# Patient Record
Sex: Female | Born: 1935 | Race: White | Hispanic: No | Marital: Single | Smoking: Former smoker
Health system: Southern US, Community
[De-identification: ages and names within clinical notes are randomized; demographics above are authoritative.]

## PROBLEM LIST (undated history)

## (undated) DIAGNOSIS — D649 Anemia, unspecified: Secondary | ICD-10-CM

## (undated) DIAGNOSIS — M6281 Muscle weakness (generalized): Secondary | ICD-10-CM

## (undated) DIAGNOSIS — N289 Disorder of kidney and ureter, unspecified: Secondary | ICD-10-CM

## (undated) DIAGNOSIS — H269 Unspecified cataract: Secondary | ICD-10-CM

## (undated) DIAGNOSIS — T7840XA Allergy, unspecified, initial encounter: Secondary | ICD-10-CM

## (undated) DIAGNOSIS — R1310 Dysphagia, unspecified: Secondary | ICD-10-CM

## (undated) DIAGNOSIS — I10 Essential (primary) hypertension: Secondary | ICD-10-CM

## (undated) DIAGNOSIS — E119 Type 2 diabetes mellitus without complications: Secondary | ICD-10-CM

## (undated) DIAGNOSIS — E079 Disorder of thyroid, unspecified: Secondary | ICD-10-CM

## (undated) DIAGNOSIS — R296 Repeated falls: Secondary | ICD-10-CM

## (undated) DIAGNOSIS — E785 Hyperlipidemia, unspecified: Secondary | ICD-10-CM

## (undated) DIAGNOSIS — I5042 Chronic combined systolic (congestive) and diastolic (congestive) heart failure: Secondary | ICD-10-CM

## (undated) HISTORY — PX: ABDOMINAL HYSTERECTOMY: SHX81

## (undated) HISTORY — PX: OOPHORECTOMY: SHX86

## (undated) HISTORY — PX: EYE SURGERY: SHX253

## (undated) HISTORY — DX: Essential (primary) hypertension: I10

## (undated) HISTORY — DX: Allergy, unspecified, initial encounter: T78.40XA

## (undated) HISTORY — DX: Unspecified cataract: H26.9

## (undated) HISTORY — DX: Hyperlipidemia, unspecified: E78.5

## (undated) HISTORY — DX: Type 2 diabetes mellitus without complications: E11.9

## (undated) HISTORY — DX: Disorder of thyroid, unspecified: E07.9

---

## 1998-03-18 ENCOUNTER — Other Ambulatory Visit: Admission: RE | Admit: 1998-03-18 | Discharge: 1998-03-18 | Payer: Self-pay | Admitting: *Deleted

## 1999-05-15 ENCOUNTER — Other Ambulatory Visit: Admission: RE | Admit: 1999-05-15 | Discharge: 1999-05-15 | Payer: Self-pay | Admitting: *Deleted

## 2000-05-07 ENCOUNTER — Other Ambulatory Visit: Admission: RE | Admit: 2000-05-07 | Discharge: 2000-05-07 | Payer: Self-pay | Admitting: Internal Medicine

## 2001-08-04 ENCOUNTER — Ambulatory Visit (HOSPITAL_COMMUNITY): Admission: RE | Admit: 2001-08-04 | Discharge: 2001-08-04 | Payer: Self-pay | Admitting: *Deleted

## 2001-08-04 ENCOUNTER — Encounter (INDEPENDENT_AMBULATORY_CARE_PROVIDER_SITE_OTHER): Payer: Self-pay

## 2002-03-05 ENCOUNTER — Emergency Department (HOSPITAL_COMMUNITY): Admission: EM | Admit: 2002-03-05 | Discharge: 2002-03-05 | Payer: Self-pay | Admitting: Emergency Medicine

## 2006-10-29 LAB — HM DEXA SCAN

## 2008-02-10 LAB — HM PAP SMEAR: HM Pap smear: NEGATIVE

## 2008-04-27 ENCOUNTER — Ambulatory Visit (HOSPITAL_COMMUNITY): Admission: RE | Admit: 2008-04-27 | Discharge: 2008-04-27 | Payer: Self-pay | Admitting: *Deleted

## 2008-04-27 ENCOUNTER — Encounter (INDEPENDENT_AMBULATORY_CARE_PROVIDER_SITE_OTHER): Payer: Self-pay | Admitting: *Deleted

## 2008-04-27 LAB — HM COLONOSCOPY

## 2009-03-04 LAB — HM MAMMOGRAPHY: HM Mammogram: NEGATIVE

## 2010-05-06 ENCOUNTER — Encounter: Payer: Self-pay | Admitting: *Deleted

## 2010-05-06 DIAGNOSIS — I110 Hypertensive heart disease with heart failure: Secondary | ICD-10-CM | POA: Insufficient documentation

## 2010-05-06 DIAGNOSIS — E039 Hypothyroidism, unspecified: Secondary | ICD-10-CM | POA: Insufficient documentation

## 2010-05-06 DIAGNOSIS — I10 Essential (primary) hypertension: Secondary | ICD-10-CM

## 2010-05-06 DIAGNOSIS — E785 Hyperlipidemia, unspecified: Secondary | ICD-10-CM | POA: Insufficient documentation

## 2010-06-06 ENCOUNTER — Encounter: Payer: Self-pay | Admitting: Nurse Practitioner

## 2010-06-06 DIAGNOSIS — E8881 Metabolic syndrome: Secondary | ICD-10-CM

## 2010-06-20 NOTE — Op Note (Signed)
NAME:  Michele Walters, ADDAIR NO.:  [id number]   MEDICAL RECORD NO.:  [id number]          PATIENT TYPE:  AMB   LOCATION:  ENDO                         FACILITY:  [facility]   PHYSICIAN:  Georgiana Spinner, M.D.    DATE OF BIRTH:  1935/10/21   DATE OF PROCEDURE:  DATE OF DISCHARGE:                               OPERATIVE REPORT   PROCEDURES:  Colonoscopy with biopsy and polypectomy.   INDICATIONS:  Colon polyps.   ANESTHESIA:  Fentanyl 100 mcg, Versed 8 mg.   DESCRIPTION OF PROCEDURE:  With the patient mildly sedated in the left  lateral decubitus position, the Pentax videoscopic pediatric colonoscope  was inserted in the rectum and passed under direct vision with pressure  applied to reach the cecum, identified by the ileocecal valve and  appendiceal orifice, both of which were photographed.  From this point  the colonoscope was slowly withdrawn, taking circumferential views of  the colonic mucosa, stopping to suction liquid brownish material.  The  patient only took three-quarters of the prep.  We then stopped just  distal to the splenic flexure, at which point a polyp was seen,  photographed, and removed using snare cautery technique at setting of  20/150 blended current.  Tissue was retrieved by suctioning it through  the endoscope into a tissue trap.  We then stopped next in the  descending colon, where a second smaller polyp was seen.  It too was  photographed and it too was removed but we used hot biopsy forceps  technique with the same setting.  We then withdrew all the way to the  rectum, which appeared normal on direct and showed hemorrhoids on  retroflexed view.  The endoscope was straightened and withdrawn.  The  patient's vital signs, pulse oximeter remained stable.  The patient  tolerated the procedure well without apparent complication.   FINDINGS:  Diverticulosis of the sigmoid colon, moderate.  Internal  hemorrhoids, moderate in size.  A small polyp of the  descending colon  and a slightly larger polyp of the splenic flexure area removed.   PLAN:  Await biopsy report.  The patient will call me for results of the  biopsies and follow up with me as needed as an outpatient.           ______________________________  Georgiana Spinner, M.D.     GMO/MEDQ  D:  04/27/2008  T:  04/27/2008  Job:  [id number]   cc:   Ernestina Penna, M.D.  Fax: [phone]

## 2010-06-23 NOTE — Procedures (Signed)
[facility]  Patient:    [name], [name] Visit Number: [id number] MRN: [id number]          Service Type: END Location: ENDO Attending Physician:  Sabino Gasser Dictated by:   Sabino Gasser, M.D. Proc. Date: 08/04/01 Admit Date:  08/04/2001 Discharge Date: 08/04/2001                             Procedure Report  PROCEDURE:  Colonoscopy.  INDICATION FOR PROCEDURE:  Colon polyps.  ANESTHESIA:  Demerol 80, Versed 8 mg.  DESCRIPTION OF PROCEDURE:  With the patient mildly sedated in the left lateral decubitus position, the Olympus videoscopic colonoscope was inserted in the rectum and passed under direct vision to the cecum identified by the ileocecal valve and appendiceal orifice, the latter of which was photographed. From this point, the colonoscope was slowly withdrawn taking circumferential views of the entire colonic mucosa, stopping at approximately 60 cm from the anal verge at which point a polyp was seen, photographed and removed using hot biopsy forceps technique on a setting of 20/20 blended current. The tissue was retrieved, the endoscope was withdrawn all the way to the rectum which appeared normal on direct and showed hemorrhoids on retroflexed view.  The endoscope was straightened and withdrawn. The patients vital signs and pulse oximeter remained stable. The patient tolerated the procedure well without apparent complications.  FINDINGS:  Internal hemorrhoids, polyp at 60 cm, await biopsy report. The patient will call me for results and followup with me as an outpatient. Dictated by:   Sabino Gasser, M.D. Attending Physician:  Sabino Gasser DD:  08/04/01 TD:  08/06/01 Job: [id number]

## 2012-04-22 ENCOUNTER — Telehealth: Payer: Self-pay | Admitting: Nurse Practitioner

## 2012-04-22 NOTE — Telephone Encounter (Signed)
Ok--if we have

## 2012-04-22 NOTE — Telephone Encounter (Signed)
Samples up front to pick up pt aware

## 2012-04-22 NOTE — Telephone Encounter (Signed)
Samples of crestor 10 mg.

## 2012-05-19 ENCOUNTER — Telehealth: Payer: Self-pay | Admitting: Nurse Practitioner

## 2012-05-19 NOTE — Telephone Encounter (Signed)
Please advise 

## 2012-05-19 NOTE — Telephone Encounter (Signed)
Samples up front 

## 2012-05-19 NOTE — Telephone Encounter (Signed)
Ok for crestor 20mg  samples

## 2012-06-14 ENCOUNTER — Other Ambulatory Visit: Payer: Self-pay | Admitting: Nurse Practitioner

## 2012-06-25 ENCOUNTER — Other Ambulatory Visit: Payer: Self-pay

## 2012-06-25 NOTE — Telephone Encounter (Signed)
Last seen 11/13.

## 2012-06-26 MED ORDER — FREESTYLE LANCETS MISC: Status: DC

## 2012-07-07 ENCOUNTER — Other Ambulatory Visit: Payer: Self-pay | Admitting: Nurse Practitioner

## 2012-07-07 ENCOUNTER — Other Ambulatory Visit: Payer: Self-pay | Admitting: Family Medicine

## 2012-07-07 ENCOUNTER — Telehealth: Payer: Self-pay | Admitting: Nurse Practitioner

## 2012-07-09 NOTE — Telephone Encounter (Signed)
Last A1C 11/13 

## 2012-07-12 ENCOUNTER — Other Ambulatory Visit: Payer: Self-pay | Admitting: Family Medicine

## 2012-07-18 NOTE — Telephone Encounter (Signed)
Patient notified that samples left up front

## 2012-08-03 ENCOUNTER — Other Ambulatory Visit: Payer: Self-pay | Admitting: Nurse Practitioner

## 2012-08-05 NOTE — Telephone Encounter (Signed)
Last seen 12/28/11 and last glucose level 12/28/11

## 2012-08-12 ENCOUNTER — Other Ambulatory Visit: Payer: Self-pay | Admitting: *Deleted

## 2012-08-12 MED ORDER — ATENOLOL 50 MG PO TABS: 50.0000 mg | ORAL_TABLET | Freq: Every day | ORAL | Status: DC

## 2012-09-02 ENCOUNTER — Other Ambulatory Visit: Payer: Self-pay | Admitting: Nurse Practitioner

## 2012-09-04 NOTE — Telephone Encounter (Signed)
Last A1C 12/28/11

## 2012-10-04 ENCOUNTER — Other Ambulatory Visit: Payer: Self-pay | Admitting: Nurse Practitioner

## 2012-10-08 NOTE — Telephone Encounter (Signed)
LAST AIC 6.2 ON 11/13. NTBS

## 2012-10-09 NOTE — Telephone Encounter (Signed)
Patient needs to be seen. Has exceeded time since last visit. Needs to bring all medications to next appointment.   

## 2012-10-11 ENCOUNTER — Other Ambulatory Visit: Payer: Self-pay | Admitting: Nurse Practitioner

## 2012-10-13 ENCOUNTER — Telehealth: Payer: Self-pay | Admitting: Nurse Practitioner

## 2012-10-13 NOTE — Telephone Encounter (Signed)
Appt given

## 2012-10-15 ENCOUNTER — Encounter: Payer: Self-pay | Admitting: Nurse Practitioner

## 2012-10-15 ENCOUNTER — Ambulatory Visit (INDEPENDENT_AMBULATORY_CARE_PROVIDER_SITE_OTHER): Payer: Medicare Other | Admitting: Nurse Practitioner

## 2012-10-15 VITALS — BP 172/76 | HR 64 | Temp 96.8°F | Ht 64.0 in | Wt 157.0 lb

## 2012-10-15 DIAGNOSIS — E8881 Metabolic syndrome: Secondary | ICD-10-CM

## 2012-10-15 DIAGNOSIS — E039 Hypothyroidism, unspecified: Secondary | ICD-10-CM

## 2012-10-15 DIAGNOSIS — E785 Hyperlipidemia, unspecified: Secondary | ICD-10-CM

## 2012-10-15 DIAGNOSIS — E119 Type 2 diabetes mellitus without complications: Secondary | ICD-10-CM | POA: Insufficient documentation

## 2012-10-15 DIAGNOSIS — I10 Essential (primary) hypertension: Secondary | ICD-10-CM

## 2012-10-15 MED ORDER — ATENOLOL 50 MG PO TABS: 50.0000 mg | ORAL_TABLET | Freq: Every day | ORAL | Status: DC

## 2012-10-15 MED ORDER — ROSUVASTATIN CALCIUM 10 MG PO TABS: 10.0000 mg | ORAL_TABLET | Freq: Every day | ORAL | Status: DC

## 2012-10-15 MED ORDER — METFORMIN HCL ER 500 MG PO TB24: 500.0000 mg | ORAL_TABLET | Freq: Every day | ORAL | Status: DC

## 2012-10-15 MED ORDER — LEVOTHYROXINE SODIUM 100 MCG PO TABS: 100.0000 ug | ORAL_TABLET | Freq: Every day | ORAL | Status: DC

## 2012-10-15 NOTE — Patient Instructions (Signed)

## 2012-10-15 NOTE — Progress Notes (Signed)
Subjective:    Patient ID: Michele Walters, female    DOB: 01-Dec-1935, 77 y.o.   MRN: [id number]  Hyperlipidemia This is a chronic problem. The current episode started more than 1 year ago. The problem is controlled. Recent lipid tests were reviewed and are normal. There are no known factors aggravating her hyperlipidemia. Pertinent negatives include no chest pain or shortness of breath. Current antihyperlipidemic treatment includes statins. The current treatment provides mild improvement of lipids. There are no compliance problems.  Risk factors for coronary artery disease include hypertension, obesity, post-menopausal and family history.  Thyroid Problem Visit type: hypothyroidism. Patient reports no anxiety, constipation, diaphoresis, diarrhea, dry skin, hair loss, heat intolerance, menstrual problem, palpitations, weight gain or weight loss. Her past medical history is significant for hyperlipidemia.  Hypertension This is a chronic problem. The current episode started more than 1 year ago. The problem is unchanged. The problem is controlled. Pertinent negatives include no blurred vision, chest pain, headaches, malaise/fatigue, neck pain, orthopnea, palpitations, PND or shortness of breath. There are no associated agents to hypertension. Risk factors for coronary artery disease include dyslipidemia, family history, obesity, post-menopausal state and sedentary lifestyle. Past treatments include beta blockers. The current treatment provides moderate improvement. Compliance problems include diet and exercise.  Hypertensive end-organ damage includes a thyroid problem.  Diabetes She presents for her follow-up diabetic visit. She has type 2 diabetes mellitus. No MedicAlert identification noted. The initial diagnosis of diabetes was made 2 years ago. Her disease course has been stable. Pertinent negatives for hypoglycemia include no headaches. Pertinent negatives for diabetes include no blurred vision, no  chest pain and no weight loss. There are no hypoglycemic complications. Symptoms are stable. There are no diabetic complications. Risk factors for coronary artery disease include family history, dyslipidemia, hypertension, obesity and post-menopausal. Current diabetic treatment includes oral agent (monotherapy). She is compliant with treatment most of the time. Her weight is stable. She is following a generally healthy diet. When asked about meal planning, she reported none. She has not had a previous visit with a dietician. She rarely participates in exercise. There is no change in her home blood glucose trend. Her breakfast blood glucose is taken between 8-9 am. Her breakfast blood glucose range is generally 90-110 mg/dl. Her overall blood glucose range is 90-110 mg/dl. An ACE inhibitor/angiotensin II receptor blocker is not being taken. She does not see a podiatrist.Eye exam is current (130865).   Review of Systems  Constitutional: Negative for weight loss, weight gain, malaise/fatigue and diaphoresis.  HENT: Negative for neck pain.   Eyes: Negative for blurred vision.  Respiratory: Negative for shortness of breath.   Cardiovascular: Negative for chest pain, palpitations, orthopnea and PND.  Gastrointestinal: Negative for diarrhea and constipation.  Endocrine: Negative for heat intolerance.  Genitourinary: Negative for menstrual problem.  Neurological: Negative for headaches.       Objective:   Physical Exam  Constitutional: She is oriented to person, place, and time. She appears well-developed and well-nourished.  HENT:  Nose: Nose normal.  Mouth/Throat: Oropharynx is clear and moist.  Eyes: EOM are normal.  Neck: Trachea normal, normal range of motion and full passive range of motion without pain. Neck supple. No JVD present. Carotid bruit is not present. No thyromegaly present.  Cardiovascular: Normal rate, regular rhythm, normal heart sounds and intact distal pulses.  Exam reveals no  gallop and no friction rub.   No murmur heard. Pulmonary/Chest: Effort normal and breath sounds normal.  Abdominal: Soft. Bowel sounds  are normal. She exhibits no distension and no mass. There is no tenderness.  Musculoskeletal: Normal range of motion.  Lymphadenopathy:    She has no cervical adenopathy.  Neurological: She is alert and oriented to person, place, and time. She has normal reflexes.  Skin: Skin is warm and dry.  Psychiatric: She has a normal mood and affect. Her behavior is normal. Judgment and thought content normal.    BP 172/76  Pulse 64  Temp(Src) 96.8 F (36 C) (Oral)  Ht 5\' 4"  (1.626 m)  Wt 157 lb (71.215 kg)  BMI 26.94 kg/m2       Assessment & Plan:  1. Hypertension Low Na+ diet - CMP14+EGFR - atenolol (TENORMIN) 50 MG tablet; Take 1 tablet (50 mg total) by mouth daily.  Dispense: 30 tablet; Refill: 5  2. Hyperlipidemia Low fat diet and exercsie - NMR, lipoprofile - rosuvastatin (CRESTOR) 10 MG tablet; Take 1 tablet (10 mg total) by mouth daily.  Dispense: 30 tablet; Refill: 5  3. Hypothyroidism  - Thyroid Panel With TSH - levothyroxine (SYNTHROID, LEVOTHROID) 100 MCG tablet; Take 1 tablet (100 mcg total) by mouth daily before breakfast.  Dispense: 30 tablet; Refill: 5  4. Type II or unspecified type diabetes mellitus without mention of complication, not stated as uncontrolled Low carb diet and exercise - POCT glycosylated hemoglobin (Hb A1C) - metFORMIN (GLUCOPHAGE-XR) 500 MG 24 hr tablet; Take 1 tablet (500 mg total) by mouth daily with breakfast.  Dispense: 30 tablet; Refill: 5  Mary-Margaret Daphine Deutscher, FNP

## 2012-10-16 ENCOUNTER — Encounter: Payer: Self-pay | Admitting: Nurse Practitioner

## 2012-10-17 ENCOUNTER — Telehealth: Payer: Self-pay | Admitting: Nurse Practitioner

## 2012-10-17 ENCOUNTER — Other Ambulatory Visit: Payer: Self-pay | Admitting: Nurse Practitioner

## 2012-10-17 LAB — THYROID PANEL WITH TSH
Free Thyroxine Index: 2.5 (ref 1.2–4.9)
T4, Total: 10 ug/dL (ref 4.5–12.0)
TSH: 0.345 u[IU]/mL — ABNORMAL LOW (ref 0.450–4.500)

## 2012-10-17 LAB — CMP14+EGFR
Albumin: 4 g/dL (ref 3.5–4.8)
Alkaline Phosphatase: 84 IU/L (ref 39–117)
BUN/Creatinine Ratio: 18 (ref 11–26)
BUN: 16 mg/dL (ref 8–27)
CO2: 27 mmol/L (ref 18–29)
Creatinine, Ser: 0.91 mg/dL (ref 0.57–1.00)
GFR calc Af Amer: 71 mL/min/{1.73_m2} (ref >59)
Globulin, Total: 3.1 g/dL (ref 1.5–4.5)

## 2012-10-17 LAB — NMR, LIPOPROFILE
HDL Cholesterol by NMR: 42 mg/dL (ref >=40)
LDLC SERPL CALC-MCNC: 75 mg/dL (ref <100)

## 2012-10-17 MED ORDER — LEVOTHYROXINE SODIUM 88 MCG PO TABS: 88.0000 ug | ORAL_TABLET | Freq: Every day | ORAL | Status: DC

## 2012-10-20 ENCOUNTER — Telehealth: Payer: Self-pay | Admitting: Nurse Practitioner

## 2012-10-21 NOTE — Telephone Encounter (Signed)
Patient aware.

## 2012-11-06 ENCOUNTER — Ambulatory Visit (INDEPENDENT_AMBULATORY_CARE_PROVIDER_SITE_OTHER): Payer: Medicare Other | Admitting: Nurse Practitioner

## 2012-11-06 ENCOUNTER — Encounter: Payer: Self-pay | Admitting: Nurse Practitioner

## 2012-11-06 VITALS — BP 151/69 | HR 67 | Temp 97.5°F | Ht 64.0 in | Wt 156.0 lb

## 2012-11-06 DIAGNOSIS — J309 Allergic rhinitis, unspecified: Secondary | ICD-10-CM

## 2012-11-06 MED ORDER — CETIRIZINE HCL 10 MG PO TABS: 10.0000 mg | ORAL_TABLET | Freq: Every day | ORAL | Status: DC

## 2012-11-06 MED ORDER — BENZONATATE 100 MG PO CAPS: 100.0000 mg | ORAL_CAPSULE | Freq: Two times a day (BID) | ORAL | Status: DC | PRN

## 2012-11-06 NOTE — Patient Instructions (Signed)
Allergic Rhinitis Allergic rhinitis is when the mucous membranes in the nose respond to allergens. Allergens are particles in the air that cause your body to have an allergic reaction. This causes you to release allergic antibodies. Through a chain of events, these eventually cause you to release histamine into the blood stream (hence the use of antihistamines). Although meant to be protective to the body, it is this release that causes your discomfort, such as frequent sneezing, congestion and an itchy runny nose.  CAUSES  The pollen allergens may come from grasses, trees, and weeds. This is seasonal allergic rhinitis, or "hay fever." Other allergens cause year-round allergic rhinitis (perennial allergic rhinitis) such as house dust mite allergen, pet dander and mold spores.  SYMPTOMS   Nasal stuffiness (congestion).  Runny, itchy nose with sneezing and tearing of the eyes.  There is often an itching of the mouth, eyes and ears. It cannot be cured, but it can be controlled with medications. DIAGNOSIS  If you are unable to determine the offending allergen, skin or blood testing may find it. TREATMENT   Avoid the allergen.  Medications and allergy shots (immunotherapy) can help.  Hay fever may often be treated with antihistamines in pill or nasal spray forms. Antihistamines block the effects of histamine. There are over-the-counter medicines that may help with nasal congestion and swelling around the eyes. Check with your caregiver before taking or giving this medicine. If the treatment above does not work, there are many new medications your caregiver can prescribe. Stronger medications may be used if initial measures are ineffective. Desensitizing injections can be used if medications and avoidance fails. Desensitization is when a patient is given ongoing shots until the body becomes less sensitive to the allergen. Make sure you follow up with your caregiver if problems continue. SEEK MEDICAL  CARE IF:   You develop fever (more than 100.5 F (38.1 C).  You develop a cough that does not stop easily (persistent).  You have shortness of breath.  You start wheezing.  Symptoms interfere with normal daily activities. Document Released: 10/17/2000 Document Revised: 04/16/2011 Document Reviewed: 04/28/2008 Exit[employer] Patient Information 2014 [employer].  

## 2012-11-06 NOTE — Progress Notes (Signed)
  Subjective:    Patient ID: Michele Walters, female    DOB: December 20, 1935, 77 y.o.   MRN: [id number]  HPI Patient in C/o runny nose sneezing, watery eyes and cough- started about 1 week ago- not getting better.    Review of Systems  Constitutional: Negative for fever, chills and appetite change.  HENT: Positive for congestion, rhinorrhea, sneezing and postnasal drip. Negative for ear pain.   Respiratory: Positive for cough (nonproductive).        Objective:   Physical Exam  Constitutional: She is oriented to person, place, and time. She appears well-developed and well-nourished.  HENT:  Right Ear: Hearing, tympanic membrane, external ear and ear canal normal.  Left Ear: Hearing, tympanic membrane, external ear and ear canal normal.  Nose: Mucosal edema and rhinorrhea present. Right sinus exhibits no maxillary sinus tenderness and no frontal sinus tenderness. Left sinus exhibits no maxillary sinus tenderness and no frontal sinus tenderness.  Mouth/Throat: Oropharynx is clear and moist and mucous membranes are normal.  Cardiovascular: Normal rate, regular rhythm and normal heart sounds.   Pulmonary/Chest: Effort normal and breath sounds normal.  Neurological: She is alert and oriented to person, place, and time.  Skin: Skin is warm.   BP 151/69  Pulse 67  Temp(Src) 97.5 F (36.4 C)  Ht 5\' 4"  (1.626 m)  Wt 156 lb (70.761 kg)  BMI 26.76 kg/m2        Assessment & Plan:   1. Allergic rhinitis    Meds ordered this encounter  Medications  . cetirizine (ZYRTEC) 10 MG tablet    Sig: Take 1 tablet (10 mg total) by mouth daily.    Dispense:  30 tablet    Refill:  2    Order Specific Question:  Supervising Provider    Answer:  Ernestina Penna [[id number]]  . benzonatate (TESSALON) 100 MG capsule    Sig: Take 1 capsule (100 mg total) by mouth 2 (two) times daily as needed for cough.    Dispense:  20 capsule    Refill:  0    Order Specific Question:  Supervising Provider    Answer:   Ernestina Penna [[id number]]  dynamist samples- 1 spray each nostril QD Force fluids Rest Avoid allergens RTO prn Mary-Margaret Daphine Deutscher, FNP

## 2012-11-12 ENCOUNTER — Other Ambulatory Visit: Payer: Self-pay | Admitting: Nurse Practitioner

## 2013-01-03 ENCOUNTER — Other Ambulatory Visit: Payer: Self-pay | Admitting: Nurse Practitioner

## 2013-01-06 NOTE — Telephone Encounter (Signed)
Med list says Crestor?

## 2013-01-06 NOTE — Telephone Encounter (Signed)
Chart says crestor- we have not filled pravachol since going electronic- which is patient taking

## 2013-01-12 ENCOUNTER — Ambulatory Visit: Payer: Medicare Other | Admitting: Nurse Practitioner

## 2013-01-19 ENCOUNTER — Encounter: Payer: Self-pay | Admitting: Nurse Practitioner

## 2013-01-19 ENCOUNTER — Ambulatory Visit: Payer: Medicare Other | Admitting: Nurse Practitioner

## 2013-01-19 ENCOUNTER — Ambulatory Visit (INDEPENDENT_AMBULATORY_CARE_PROVIDER_SITE_OTHER): Payer: Medicare Other | Admitting: Nurse Practitioner

## 2013-01-19 VITALS — BP 145/66 | HR 70 | Temp 98.7°F | Ht 64.0 in | Wt 153.0 lb

## 2013-01-19 DIAGNOSIS — E119 Type 2 diabetes mellitus without complications: Secondary | ICD-10-CM

## 2013-01-19 DIAGNOSIS — E785 Hyperlipidemia, unspecified: Secondary | ICD-10-CM

## 2013-01-19 DIAGNOSIS — I10 Essential (primary) hypertension: Secondary | ICD-10-CM

## 2013-01-19 DIAGNOSIS — E039 Hypothyroidism, unspecified: Secondary | ICD-10-CM

## 2013-01-19 LAB — POCT UA - MICROSCOPIC ONLY
Casts, Ur, LPF, POC: NEGATIVE
Yeast, UA: NEGATIVE

## 2013-01-19 MED ORDER — GLUCOSE BLOOD VI STRP: ORAL_STRIP | Status: DC

## 2013-01-19 NOTE — Patient Instructions (Signed)
Diabetes and Foot Care Diabetes may cause you to have problems because of poor blood supply (circulation) to your feet and legs. This may cause the skin on your feet to become thinner, break easier, and heal more slowly. Your skin may become dry, and the skin may peel and crack. You may also have nerve damage in your legs and feet causing decreased feeling in them. You may not notice minor injuries to your feet that could lead to infections or more serious problems. Taking care of your feet is one of the most important things you can do for yourself.  HOME CARE INSTRUCTIONS  Wear shoes at all times, even in the house. Do not go barefoot. Bare feet are easily injured.  Check your feet daily for blisters, cuts, and redness. If you cannot see the bottom of your feet, use a mirror or ask someone for help.  Wash your feet with warm water (do not use hot water) and mild soap. Then pat your feet and the areas between your toes until they are completely dry. Do not soak your feet as this can dry your skin.  Apply a moisturizing lotion or petroleum jelly (that does not contain alcohol and is unscented) to the skin on your feet and to dry, brittle toenails. Do not apply lotion between your toes.  Trim your toenails straight across. Do not dig under them or around the cuticle. File the edges of your nails with an emery board or nail file.  Do not cut corns or calluses or try to remove them with medicine.  Wear clean socks or stockings every day. Make sure they are not too tight. Do not wear knee-high stockings since they may decrease blood flow to your legs.  Wear shoes that fit properly and have enough cushioning. To break in new shoes, wear them for just a few hours a day. This prevents you from injuring your feet. Always look in your shoes before you put them on to be sure there are no objects inside.  Do not cross your legs. This may decrease the blood flow to your feet.  If you find a minor scrape,  cut, or break in the skin on your feet, keep it and the skin around it clean and dry. These areas may be cleansed with mild soap and water. Do not cleanse the area with peroxide, alcohol, or iodine.  When you remove an adhesive bandage, be sure not to damage the skin around it.  If you have a wound, look at it several times a day to make sure it is healing.  Do not use heating pads or hot water bottles. They may burn your skin. If you have lost feeling in your feet or legs, you may not know it is happening until it is too late.  Make sure your health care provider performs a complete foot exam at least annually or more often if you have foot problems. Report any cuts, sores, or bruises to your health care provider immediately. SEEK MEDICAL CARE IF:   You have an injury that is not healing.  You have cuts or breaks in the skin.  You have an ingrown nail.  You notice redness on your legs or feet.  You feel burning or tingling in your legs or feet.  You have pain or cramps in your legs and feet.  Your legs or feet are numb.  Your feet always feel cold. SEEK IMMEDIATE MEDICAL CARE IF:   There is increasing redness,   swelling, or pain in or around a wound.  There is a red line that goes up your leg.  Pus is coming from a wound.  You develop a fever or as directed by your health care provider.  You notice a bad smell coming from an ulcer or wound. Document Released: 01/20/2000 Document Revised: 09/24/2012 Document Reviewed: 07/01/2012 [employer] Patient Information 2014 [employer].  

## 2013-01-19 NOTE — Progress Notes (Signed)
Subjective:    Patient ID: Michele Walters, female    DOB: 10/02/1935, 77 y.o.   MRN: [id number]  Patient here today for follow up pf multiple medical problems- SHe is doing well- no changes since last visit.  Hyperlipidemia This is a chronic problem. The current episode started more than 1 year ago. The problem is controlled. Recent lipid tests were reviewed and are normal. There are no known factors aggravating her hyperlipidemia. Pertinent negatives include no chest pain or shortness of breath. Current antihyperlipidemic treatment includes statins. The current treatment provides mild improvement of lipids. There are no compliance problems.  Risk factors for coronary artery disease include hypertension, obesity, post-menopausal and family history.  Thyroid Problem Visit type: hypothyroidism. Patient reports no anxiety, constipation, diaphoresis, diarrhea, dry skin, hair loss, heat intolerance, menstrual problem, palpitations, weight gain or weight loss. Her past medical history is significant for hyperlipidemia.  Hypertension This is a chronic problem. The current episode started more than 1 year ago. The problem is unchanged. The problem is controlled. Pertinent negatives include no blurred vision, chest pain, headaches, malaise/fatigue, neck pain, orthopnea, palpitations, PND or shortness of breath. There are no associated agents to hypertension. Risk factors for coronary artery disease include dyslipidemia, family history, obesity, post-menopausal state and sedentary lifestyle. Past treatments include beta blockers. The current treatment provides moderate improvement. Compliance problems include diet and exercise.  Hypertensive end-organ damage includes a thyroid problem.  Diabetes She presents for her follow-up diabetic visit. She has type 2 diabetes mellitus. No MedicAlert identification noted. The initial diagnosis of diabetes was made 2 years ago. Her disease course has been stable. Pertinent  negatives for hypoglycemia include no headaches. Pertinent negatives for diabetes include no blurred vision, no chest pain and no weight loss. There are no hypoglycemic complications. Symptoms are stable. There are no diabetic complications. Risk factors for coronary artery disease include family history, dyslipidemia, hypertension, obesity and post-menopausal. Current diabetic treatment includes oral agent (monotherapy). She is compliant with treatment most of the time. Her weight is stable. She is following a generally healthy diet. When asked about meal planning, she reported none. She has not had a previous visit with a dietician. She rarely participates in exercise. There is no change in her home blood glucose trend. Her breakfast blood glucose is taken between 8-9 am. Her breakfast blood glucose range is generally 90-110 mg/dl. Her overall blood glucose range is 90-110 mg/dl. An ACE inhibitor/angiotensin II receptor blocker is not being taken. She does not see a podiatrist.Eye exam is current (330076).   Review of Systems  Constitutional: Negative for weight loss, weight gain, malaise/fatigue and diaphoresis.  Eyes: Negative for blurred vision.  Respiratory: Negative for shortness of breath.   Cardiovascular: Negative for chest pain, palpitations, orthopnea and PND.  Gastrointestinal: Negative for diarrhea and constipation.  Endocrine: Negative for heat intolerance.  Genitourinary: Negative for menstrual problem.  Musculoskeletal: Negative for neck pain.  Neurological: Negative for headaches.       Objective:   Physical Exam  Constitutional: She is oriented to person, place, and time. She appears well-developed and well-nourished.  HENT:  Nose: Nose normal.  Mouth/Throat: Oropharynx is clear and moist.  Eyes: EOM are normal.  Neck: Trachea normal, normal range of motion and full passive range of motion without pain. Neck supple. No JVD present. Carotid bruit is not present. No thyromegaly  present.  Cardiovascular: Normal rate, regular rhythm, normal heart sounds and intact distal pulses.  Exam reveals no gallop and no  friction rub.   No murmur heard. Pulmonary/Chest: Effort normal and breath sounds normal.  Abdominal: Soft. Bowel sounds are normal. She exhibits no distension and no mass. There is no tenderness.  Musculoskeletal: Normal range of motion.  Lymphadenopathy:    She has no cervical adenopathy.  Neurological: She is alert and oriented to person, place, and time. She has normal reflexes.  Skin: Skin is warm and dry.  Psychiatric: She has a normal mood and affect. Her behavior is normal. Judgment and thought content normal.    BP 145/66  Pulse 70  Temp(Src) 98.7 F (37.1 C) (Oral)  Ht 5\' 4"  (1.626 m)  Wt 153 lb (69.4 kg)  BMI 26.25 kg/m2  Results for orders placed in visit on 01/19/13  POCT GLYCOSYLATED HEMOGLOBIN (HGB A1C)      Result Value Range   Hemoglobin A1C 5.4 %           Assessment & Plan:   1. HTN (hypertension)   2. Hypothyroidism   3. Hyperlipidemia   4. Type II or unspecified type diabetes mellitus without mention of complication, not stated as uncontrolled    Orders Placed This Encounter  Procedures  . CMP14+EGFR  . NMR, lipoprofile  . Thyroid Panel With TSH  . POCT glycosylated hemoglobin (Hb A1C)  . POCT UA - Microscopic Only   Meds ordered this encounter  Medications  . DISCONTD: Glucose Blood (FREESTYLE LITE TEST VI)    Sig:   . DISCONTD: Lancets (FREESTYLE) lancets    Sig:   . DISCONTD: levothyroxine (SYNTHROID, LEVOTHROID) 100 MCG tablet    Sig: Take 100 mcg by mouth daily before breakfast.  . levothyroxine (SYNTHROID, LEVOTHROID) 88 MCG tablet    Sig: Take 88 mcg by mouth daily before breakfast.  . glucose blood (ONETOUCH VERIO) test strip    Sig: Use as instructed    Dispense:  100 each    Refill:  12    Order Specific Question:  Supervising Provider    Answer:  Ernestina Penna [[id number]]   Patient given onetouch  verio glucose monitor Continue all meds Labs pending Diet and exercise encouraged Health maintenance reviewed Follow up in 3 months  Mary-Margaret Daphine Deutscher, FNP

## 2013-01-21 LAB — CMP14+EGFR
AST: 19 IU/L (ref 0–40)
Albumin/Globulin Ratio: 1.7 (ref 1.1–2.5)
Albumin: 4.3 g/dL (ref 3.5–4.8)
Alkaline Phosphatase: 74 IU/L (ref 39–117)
BUN/Creatinine Ratio: 13 (ref 11–26)
BUN: 11 mg/dL (ref 8–27)
CO2: 22 mmol/L (ref 18–29)
Chloride: 100 mmol/L (ref 97–108)
GFR calc Af Amer: 80 mL/min/{1.73_m2} (ref >59)
GFR calc non Af Amer: 69 mL/min/{1.73_m2} (ref >59)
Sodium: 140 mmol/L (ref 134–144)
Total Bilirubin: 0.4 mg/dL (ref 0.0–1.2)

## 2013-01-21 LAB — NMR, LIPOPROFILE
Cholesterol: 141 mg/dL (ref <200)
HDL Particle Number: 26.5 umol/L — ABNORMAL LOW (ref >=30.5)
LDL Particle Number: 848 nmol/L (ref <1000)
LDL Size: 21.4 nm (ref >20.5)
LP-IR Score: <25 (ref <=45)
Triglycerides by NMR: 154 mg/dL — ABNORMAL HIGH (ref <150)

## 2013-01-21 LAB — THYROID PANEL WITH TSH
Free Thyroxine Index: 2.3 (ref 1.2–4.9)
T3 Uptake Ratio: 25 % (ref 24–39)
T4, Total: 9.2 ug/dL (ref 4.5–12.0)

## 2013-03-06 ENCOUNTER — Other Ambulatory Visit: Payer: Self-pay | Admitting: *Deleted

## 2013-03-06 DIAGNOSIS — I10 Essential (primary) hypertension: Secondary | ICD-10-CM

## 2013-03-06 DIAGNOSIS — E119 Type 2 diabetes mellitus without complications: Secondary | ICD-10-CM

## 2013-03-06 MED ORDER — ATENOLOL 50 MG PO TABS: 50.0000 mg | ORAL_TABLET | Freq: Every day | ORAL | Status: DC

## 2013-03-06 MED ORDER — METFORMIN HCL ER 500 MG PO TB24: 500.0000 mg | ORAL_TABLET | Freq: Every day | ORAL | Status: DC

## 2013-03-09 ENCOUNTER — Other Ambulatory Visit: Payer: Self-pay

## 2013-03-09 DIAGNOSIS — E119 Type 2 diabetes mellitus without complications: Secondary | ICD-10-CM

## 2013-03-09 MED ORDER — METFORMIN HCL ER 500 MG PO TB24: 500.0000 mg | ORAL_TABLET | Freq: Every day | ORAL | Status: DC

## 2013-04-07 ENCOUNTER — Telehealth: Payer: Self-pay | Admitting: Nurse Practitioner

## 2013-04-07 NOTE — Telephone Encounter (Signed)
Patient aware samples are up front 

## 2013-04-30 ENCOUNTER — Ambulatory Visit (INDEPENDENT_AMBULATORY_CARE_PROVIDER_SITE_OTHER): Payer: Medicare Other | Admitting: Family Medicine

## 2013-04-30 VITALS — BP 161/82 | HR 70 | Temp 97.0°F | Wt 147.8 lb

## 2013-04-30 DIAGNOSIS — B029 Zoster without complications: Secondary | ICD-10-CM

## 2013-04-30 MED ORDER — ACYCLOVIR 800 MG PO TABS: 800.0000 mg | ORAL_TABLET | Freq: Every day | ORAL | Status: DC

## 2013-04-30 NOTE — Progress Notes (Signed)
   Subjective:    Patient ID: Michele AcreLaura B Granberry, female    DOB: 06/06/1935, 78 y.o.   MRN: [id number]  HPI This 78 y.o. female presents for evaluation of rash.  She had rash on left back and neck area for 2 days.  The rash itches sometimes but is not painful.  Review of Systems C/o rash   No chest pain, SOB, HA, dizziness, vision change, N/V, diarrhea, constipation, dysuria, urinary urgency or frequency, myalgias, arthralgias or rash.  Objective:   Physical Exam  Vital signs noted  Well developed well nourished female.  HEENT - Head atraumatic Normocephalic                Eyes - PERRLA, Conjuctiva - clear Sclera- Clear EOMI                Ears - EAC's Wnl TM's Wnl Gross Hearing WNL                Throat - oropharanx wnl Respiratory - Lungs CTA bilateral Cardiac - RRR S1 and S2 without murmur GI - Abdomen soft Nontender and bowel sounds active x 4 Skin - Vesicular erythematous rash on left neck and shoulder     Assessment & Plan:  Shingles - Plan: acyclovir (ZOVIRAX) 800 MG tablet Po 5xday for 7 days and follow up if not better prn. She has had shingles vaccination.  Discussed that she Could experience some pain and may need to followup.  Deatra CanterWilliam J Andra Heslin FNP

## 2013-05-11 ENCOUNTER — Other Ambulatory Visit: Payer: Self-pay | Admitting: *Deleted

## 2013-05-11 MED ORDER — LEVOTHYROXINE SODIUM 88 MCG PO TABS: 88.0000 ug | ORAL_TABLET | Freq: Every day | ORAL | Status: DC

## 2013-05-11 MED ORDER — FREESTYLE LANCETS MISC: Status: DC

## 2013-05-12 ENCOUNTER — Ambulatory Visit (INDEPENDENT_AMBULATORY_CARE_PROVIDER_SITE_OTHER): Payer: Medicare Other | Admitting: Nurse Practitioner

## 2013-05-12 ENCOUNTER — Encounter: Payer: Self-pay | Admitting: Nurse Practitioner

## 2013-05-12 VITALS — BP 131/80 | HR 65 | Temp 97.1°F | Ht 64.0 in | Wt 149.0 lb

## 2013-05-12 DIAGNOSIS — E785 Hyperlipidemia, unspecified: Secondary | ICD-10-CM

## 2013-05-12 DIAGNOSIS — Z13820 Encounter for screening for osteoporosis: Secondary | ICD-10-CM

## 2013-05-12 DIAGNOSIS — I10 Essential (primary) hypertension: Secondary | ICD-10-CM

## 2013-05-12 DIAGNOSIS — M810 Age-related osteoporosis without current pathological fracture: Secondary | ICD-10-CM

## 2013-05-12 DIAGNOSIS — E119 Type 2 diabetes mellitus without complications: Secondary | ICD-10-CM

## 2013-05-12 DIAGNOSIS — E039 Hypothyroidism, unspecified: Secondary | ICD-10-CM

## 2013-05-12 LAB — POCT GLYCOSYLATED HEMOGLOBIN (HGB A1C): HEMOGLOBIN A1C: 5.9

## 2013-05-12 MED ORDER — ATENOLOL 50 MG PO TABS: 50.0000 mg | ORAL_TABLET | Freq: Every day | ORAL | Status: DC

## 2013-05-12 MED ORDER — CVS LANCETS ULTRA THIN 30G MISC: Status: DC

## 2013-05-12 MED ORDER — METFORMIN HCL ER 500 MG PO TB24: 500.0000 mg | ORAL_TABLET | Freq: Every day | ORAL | Status: DC

## 2013-05-12 NOTE — Addendum Note (Signed)
Addended by: Orma RenderHODGES, Nicolette Gieske F on: 05/12/2013 04:14 PM   Modules accepted: Orders

## 2013-05-12 NOTE — Addendum Note (Signed)
Addended by: Orma RenderHODGES, Niley Helbig F on: 05/12/2013 04:11 PM   Modules accepted: Orders

## 2013-05-12 NOTE — Addendum Note (Signed)
Addended by: Bennie PieriniMARTIN, MARY-MARGARET on: 05/12/2013 02:41 PM   Modules accepted: Orders

## 2013-05-12 NOTE — Patient Instructions (Signed)

## 2013-05-12 NOTE — Progress Notes (Addendum)
Subjective:    Patient ID: Michele Walters, female    DOB: 10/02/1935, 78 y.o.   MRN: [id number]  Patient here today for follow up pf multiple medical problems- SHe is doing well- no changes since last visit.  Hyperlipidemia This is a chronic problem. The current episode started more than 1 year ago. The problem is controlled. Recent lipid tests were reviewed and are normal. There are no known factors aggravating her hyperlipidemia. Pertinent negatives include no chest pain or shortness of breath. Current antihyperlipidemic treatment includes statins. The current treatment provides mild improvement of lipids. There are no compliance problems.  Risk factors for coronary artery disease include hypertension, obesity, post-menopausal and family history.  Thyroid Problem Visit type: hypothyroidism. Patient reports no anxiety, constipation, diaphoresis, diarrhea, dry skin, hair loss, heat intolerance, menstrual problem, palpitations, weight gain or weight loss. Her past medical history is significant for hyperlipidemia.  Hypertension This is a chronic problem. The current episode started more than 1 year ago. The problem is unchanged. The problem is controlled. Pertinent negatives include no blurred vision, chest pain, headaches, malaise/fatigue, neck pain, orthopnea, palpitations, PND or shortness of breath. There are no associated agents to hypertension. Risk factors for coronary artery disease include dyslipidemia, family history, obesity, post-menopausal state and sedentary lifestyle. Past treatments include beta blockers. The current treatment provides moderate improvement. Compliance problems include diet and exercise.  Hypertensive end-organ damage includes a thyroid problem.  Diabetes She presents for her follow-up diabetic visit. She has type 2 diabetes mellitus. No MedicAlert identification noted. The initial diagnosis of diabetes was made 2 years ago. Her disease course has been stable. Pertinent  negatives for hypoglycemia include no headaches. Pertinent negatives for diabetes include no blurred vision, no chest pain and no weight loss. There are no hypoglycemic complications. Symptoms are stable. There are no diabetic complications. Risk factors for coronary artery disease include family history, dyslipidemia, hypertension, obesity and post-menopausal. Current diabetic treatment includes oral agent (monotherapy). She is compliant with treatment most of the time. Her weight is stable. She is following a generally healthy diet. When asked about meal planning, she reported none. She has not had a previous visit with a dietician. She rarely participates in exercise. There is no change in her home blood glucose trend. Her breakfast blood glucose is taken between 8-9 am. Her breakfast blood glucose range is generally 90-110 mg/dl. Her overall blood glucose range is 90-110 mg/dl. An ACE inhibitor/angiotensin II receptor blocker is not being taken. She does not see a podiatrist.Eye exam is current (330076).   Review of Systems  Constitutional: Negative for weight loss, weight gain, malaise/fatigue and diaphoresis.  Eyes: Negative for blurred vision.  Respiratory: Negative for shortness of breath.   Cardiovascular: Negative for chest pain, palpitations, orthopnea and PND.  Gastrointestinal: Negative for diarrhea and constipation.  Endocrine: Negative for heat intolerance.  Genitourinary: Negative for menstrual problem.  Musculoskeletal: Negative for neck pain.  Neurological: Negative for headaches.       Objective:   Physical Exam  Constitutional: She is oriented to person, place, and time. She appears well-developed and well-nourished.  HENT:  Nose: Nose normal.  Mouth/Throat: Oropharynx is clear and moist.  Eyes: EOM are normal.  Neck: Trachea normal, normal range of motion and full passive range of motion without pain. Neck supple. No JVD present. Carotid bruit is not present. No thyromegaly  present.  Cardiovascular: Normal rate, regular rhythm, normal heart sounds and intact distal pulses.  Exam reveals no gallop and no  friction rub.   No murmur heard. Pulmonary/Chest: Effort normal and breath sounds normal.  Abdominal: Soft. Bowel sounds are normal. She exhibits no distension and no mass. There is no tenderness.  Musculoskeletal: Normal range of motion.  Lymphadenopathy:    She has no cervical adenopathy.  Neurological: She is alert and oriented to person, place, and time. She has normal reflexes.  Skin: Skin is warm and dry.  Psychiatric: She has a normal mood and affect. Her behavior is normal. Judgment and thought content normal.    BP 131/80  Pulse 65  Temp(Src) 97.1 F (36.2 C) (Oral)  Ht 5' 4"  (1.626 m)  Wt 149 lb (67.586 kg)  BMI 25.56 kg/m2  Results for orders placed in visit on 05/12/13  POCT GLYCOSYLATED HEMOGLOBIN (HGB A1C)      Result Value Ref Range   Hemoglobin A1C 5.9           Assessment & Plan:   1. Type II or unspecified type diabetes mellitus without mention of complication, not stated as uncontrolled   2. Hypothyroidism   3. Hyperlipidemia   4. HTN (hypertension)   5. Osteoporosis    Orders Placed This Encounter  Procedures  . CMP14+EGFR  . NMR, lipoprofile  . POCT glycosylated hemoglobin (Hb A1C)   Meds ordered this encounter  Medications  . aspirin 81 MG tablet    Sig: Take 81 mg by mouth daily.  . [facility] Lancets Ultra Thin MISC    Sig: Check blood sugar daily and PRN    Dispense:  100 each    Refill:  6  . atenolol (TENORMIN) 50 MG tablet    Sig: Take 1 tablet (50 mg total) by mouth daily.    Dispense:  90 tablet    Refill:  1    Order Specific Question:  Supervising Provider    Answer:  Chipper Herb [[id number]]  . metFORMIN (GLUCOPHAGE-XR) 500 MG 24 hr tablet    Sig: Take 1 tablet (500 mg total) by mouth daily with breakfast.    Dispense:  90 tablet    Refill:  1    Order Specific Question:  Supervising Provider     Answer:  Joycelyn Man   Ordered dexa scan Labs pending Health maintenance reviewed Diet and exercise encouraged Continue all meds Follow up  In 3 months   [place], FNP

## 2013-05-13 LAB — CMP14+EGFR
ALBUMIN: 4.2 g/dL (ref 3.5–4.8)
ALT: 14 IU/L (ref 0–32)
AST: 19 IU/L (ref 0–40)
Albumin/Globulin Ratio: 1.4 (ref 1.1–2.5)
Alkaline Phosphatase: 82 IU/L (ref 39–117)
BILIRUBIN TOTAL: 0.4 mg/dL (ref 0.0–1.2)
BUN/Creatinine Ratio: 19 (ref 11–26)
BUN: 16 mg/dL (ref 8–27)
CHLORIDE: 99 mmol/L (ref 97–108)
CO2: 25 mmol/L (ref 18–29)
Calcium: 9.7 mg/dL (ref 8.7–10.3)
Creatinine, Ser: 0.85 mg/dL (ref 0.57–1.00)
GFR calc non Af Amer: 67 mL/min/{1.73_m2} (ref >59)
GFR, EST AFRICAN AMERICAN: 78 mL/min/{1.73_m2} (ref >59)
Globulin, Total: 3 g/dL (ref 1.5–4.5)
Glucose: 127 mg/dL — ABNORMAL HIGH (ref 65–99)
Potassium: 4.5 mmol/L (ref 3.5–5.2)
SODIUM: 140 mmol/L (ref 134–144)
Total Protein: 7.2 g/dL (ref 6.0–8.5)

## 2013-05-13 LAB — NMR, LIPOPROFILE
CHOLESTEROL: 157 mg/dL (ref <200)
HDL Cholesterol by NMR: 49 mg/dL (ref >=40)
HDL PARTICLE NUMBER: 24.2 umol/L — AB (ref >=30.5)
LDL Particle Number: 1000 nmol/L — ABNORMAL HIGH (ref <1000)
LDL SIZE: 21.4 nm (ref >20.5)
LDLC SERPL CALC-MCNC: 81 mg/dL (ref <100)
Small LDL Particle Number: 321 nmol/L (ref <=527)
Triglycerides by NMR: 135 mg/dL (ref <150)

## 2013-07-07 ENCOUNTER — Telehealth: Payer: Self-pay | Admitting: Nurse Practitioner

## 2013-07-08 MED ORDER — ROSUVASTATIN CALCIUM 20 MG PO TABS: 10.0000 mg | ORAL_TABLET | Freq: Every day | ORAL | Status: DC

## 2013-07-08 NOTE — Telephone Encounter (Signed)
Samples available to be picked up at front. Patient aware.

## 2013-07-15 ENCOUNTER — Ambulatory Visit (INDEPENDENT_AMBULATORY_CARE_PROVIDER_SITE_OTHER): Payer: Medicare Other | Admitting: Pharmacist

## 2013-07-15 ENCOUNTER — Ambulatory Visit (INDEPENDENT_AMBULATORY_CARE_PROVIDER_SITE_OTHER): Payer: Medicare Other

## 2013-07-15 ENCOUNTER — Encounter: Payer: Self-pay | Admitting: Pharmacist

## 2013-07-15 VITALS — BP 134/78 | HR 70 | Ht 61.0 in | Wt 147.0 lb

## 2013-07-15 DIAGNOSIS — E119 Type 2 diabetes mellitus without complications: Secondary | ICD-10-CM

## 2013-07-15 DIAGNOSIS — Z13820 Encounter for screening for osteoporosis: Secondary | ICD-10-CM

## 2013-07-15 DIAGNOSIS — Z0000 Encounter for general adult medical examination without abnormal findings: Secondary | ICD-10-CM

## 2013-07-15 DIAGNOSIS — E039 Hypothyroidism, unspecified: Secondary | ICD-10-CM

## 2013-07-15 LAB — HM DEXA SCAN: HM DEXA SCAN: NORMAL

## 2013-07-15 NOTE — Progress Notes (Signed)
Patient ID: Michele AcreLaura B Lookingbill, female   DOB: 12/16/1935, 78 y.o.   MRN: [id number]  Subjective:    Michele Walters is a 78 y.o. female who presents for Medicare Initial preventive wellness exan and review of BMD /DEXA results from today.  This is the patient's first Dexa per our records.  In reviewing her medications, there is a discrepancy between our medication list and her medication list with regards to levothyroxine.  Our records show she is taking 88mcg but her medication list has 100mcg.  I called her pharmacy ang confirmed that she received levothyroxine 100mcg daily 10/2012 but this was changed to 88mcg in January 2015 and she also filled for 88mcg March 2015 (both for 90 days supplies) Last thyroid panel was 01/2013 which was WNL.  Patient denies the following:  dry skin, cold or heat intolerance, hair loss, weight changes Patient does report feeling more fatigued lately  Preventive Screening-Counseling & Management  Tobacco History  Smoking status  . Current Every Day Smoker -- 0.50 packs/day for 55 years  . Types: Cigarettes  Smokeless tobacco  . Never Used    Current Problems (verified) Patient Active Problem List   Diagnosis Date Noted  . Type II or unspecified type diabetes mellitus without mention of complication, not stated as uncontrolled 10/15/2012  . HTN (hypertension) 05/06/2010  . Hypothyroidism 05/06/2010  . Hyperlipidemia 05/06/2010    Medications Prior to Visit Current Outpatient Prescriptions on File Prior to Visit  Medication Sig Dispense Refill  . aspirin 81 MG tablet Take 81 mg by mouth daily.      Marland Kitchen. atenolol (TENORMIN) 50 MG tablet Take 1 tablet (50 mg total) by mouth daily.  90 tablet  1  . [facility] Lancets Ultra Thin MISC Check blood sugar daily and PRN  100 each  6  . glucose blood (ONETOUCH VERIO) test strip Use as instructed  100 each  12  . metFORMIN (GLUCOPHAGE-XR) 500 MG 24 hr tablet Take 1 tablet (500 mg total) by mouth daily with breakfast.  90 tablet   1  . rosuvastatin (CRESTOR) 10 MG tablet Take 1 tablet (10 mg total) by mouth daily.  30 tablet  5  . acyclovir (ZOVIRAX) 800 MG tablet Take 1 tablet (800 mg total) by mouth 5 (five) times daily.  35 tablet  0  . rosuvastatin (CRESTOR) 20 MG tablet Take 0.5 tablets (10 mg total) by mouth daily.  14 tablet  0   No current facility-administered medications on file prior to visit.    Current Medications (verified) Current Outpatient Prescriptions  Medication Sig Dispense Refill  . aspirin 81 MG tablet Take 81 mg by mouth daily.      Marland Kitchen. atenolol (TENORMIN) 50 MG tablet Take 1 tablet (50 mg total) by mouth daily.  90 tablet  1  . calcium carbonate (OS-CAL) 600 MG TABS tablet Take 600 mg by mouth daily.      . [facility] Lancets Ultra Thin MISC Check blood sugar daily and PRN  100 each  6  . glucose blood (ONETOUCH VERIO) test strip Use as instructed  100 each  12  . levothyroxine (SYNTHROID, LEVOTHROID) 88 MCG tablet Take 88 mcg by mouth daily.      . metFORMIN (GLUCOPHAGE-XR) 500 MG 24 hr tablet Take 1 tablet (500 mg total) by mouth daily with breakfast.  90 tablet  1  . Omega-3 Fatty Acids (FISH OIL) 1000 MG CAPS Take 1 capsule by mouth daily.      .Marland Kitchen  rosuvastatin (CRESTOR) 10 MG tablet Take 1 tablet (10 mg total) by mouth daily.  30 tablet  5  . acyclovir (ZOVIRAX) 800 MG tablet Take 1 tablet (800 mg total) by mouth 5 (five) times daily.  35 tablet  0  . cetirizine (ZYRTEC) 10 MG tablet Take 10 mg by mouth daily as needed.      . rosuvastatin (CRESTOR) 20 MG tablet Take 0.5 tablets (10 mg total) by mouth daily.  14 tablet  0   No current facility-administered medications for this visit.     Allergies (verified) Review of patient's allergies indicates no known allergies.   PAST HISTORY  Family History Family History  Problem Relation Age of Onset  . Heart disease Mother   . COPD Father   . Emphysema Father   . Down syndrome Brother     Social History History  Substance Use Topics  .  Smoking status: Current Every Day Smoker -- 0.50 packs/day for 55 years    Types: Cigarettes  . Smokeless tobacco: Never Used  . Alcohol Use: Yes     Are there smokers in your home (other than you)? Yes  Risk Factors Current exercise habits: Home exercise routine includes walking 0.5 hrs per day.  Dietary issues discussed: none    Cardiac risk factors: advanced age (older than 60 for men, 32 for women), diabetes mellitus, dyslipidemia, hypertension and smoking/ tobacco exposure.  Depression Screen (Note: if answer to either of the following is "Yes", a more complete depression screening is indicated)   Over the past 2 weeks, have you felt down, depressed or hopeless? No  Over the past 2 weeks, have you felt little interest or pleasure in doing things? No  Have you lost interest or pleasure in daily life? No  Do you often feel hopeless? No  Do you cry easily over simple problems? No  Activities of Daily Living In your present state of health, do you have any difficulty performing the following activities?:  Driving? No Managing money?  No Feeding yourself? No Getting from bed to chair? No Climbing a flight of stairs? No Preparing food and eating?: No Bathing or showering? No Getting dressed: No Getting to the toilet? No Using the toilet:No Moving around from place to place: No In the past year have you fallen or had a near fall?:No   Are you sexually active?  No  Do you have more than one partner?  No  Hearing Difficulties: No Do you often ask people to speak up or repeat themselves? No Do you experience ringing or noises in your ears? No Do you have difficulty understanding soft or whispered voices? Yes   Do you feel that you have a problem with memory? No  Do you often misplace items? No  Do you feel safe at home?  Yes  Cognitive Testing  Alert? Yes  Normal Appearance?Yes  Oriented to person? Yes  Place? Yes   Time? Yes  Recall of three objects?  Yes  Can  perform simple calculations? Yes  Displays appropriate judgment?Yes  Can read the correct time from a watch face?Yes   Advanced Directives have been discussed with the patient? Yes  List the Names of Other Physician/Practitioners you currently use: 1.  none  Indicate any recent Medical Services you may have received from other than [facility] providers in the past year (date may be approximate).  Immunization History  Administered Date(s) Administered  . Influenza, High Dose Seasonal PF 10/16/2012  . Zoster 03/14/2008  Screening Tests Health Maintenance  Topic Date Due  . Colon Cancer Screening Annual Fobt  07/20/2013 (Originally 09/12/1987)  . Tetanus/tdap  07/20/2013 (Originally 02/06/2012)  . Ophthalmology Exam  08/11/2013 (Originally 11/05/2012)  . Influenza Vaccine  09/05/2013  . Mammogram  09/27/2013  . Urine Microalbumin  10/15/2013  . Hemoglobin A1c  11/11/2013  . Foot Exam  05/13/2014  . Colonoscopy  04/28/2018  . Pneumococcal Polysaccharide Vaccine Age 81 And Over  Completed  . Zostavax  Completed    All answers were reviewed with the patient and necessary referrals were made:  Henrene Pastor, [facility]   07/15/2013   History reviewed: allergies, current medications, past family history, past medical history, past social history, past surgical history and problem list   Objective:    Body mass index is 27.79 kg/(m^2). BP 134/78  Pulse 70  Ht 5\' 1"  (1.549 m)  Wt 147 lb (66.679 kg)  BMI 27.79 kg/m2  DEXA Results:  L1-L4 T-Score =  -0.7 Left neck of hip T Score = -0.2 Right neck of hip T Score = 0.0  Assessment:     Medicare Annual Wellness Exam Medication Management -verification of levothyroxine dose Hypothyroidism - since dose was changed in January 2015 will check thyroid panel today Normal BMD results Smoking Cessation - patient in contemplative stage      Plan:     During the course of the visit the patient was educated and counseled about appropriate  screening and preventive services including:    Pneumococcal vaccine   Influenza vaccine  Hepatitis B vaccine  Td vaccine  Screening electrocardiogram  Screening mammography  Bone densitometry screening  Colorectal cancer screening  Glaucoma screening  Nutrition counseling   Smoking cessation counseling  Advanced directives: has NO advanced directive - not interested in additional information  Patient is referred to audiologist - has hearing aid that she sometimes wears that was her brothers.  She declines today  Patient to make appt for dilated / diabetic eye exam Checked on Tdap vaccine - cost was $60.47, patient declined today  Orders Placed This Encounter  Procedures  . HM DEXA SCAN    This external order was created through the Results Console.  . Thyroid Panel With TSH    Diet review for nutrition referral? Not needed  Patient Instructions (the written plan) was given to the patient.  Medicare Attestation I have personally reviewed: The patient's medical and social history Their use of alcohol, tobacco or illicit drugs Their current medications and supplements The patient's functional ability including ADLs,fall risks, home safety risks, cognitive, and hearing and visual impairment Diet and physical activities Evidence for depression or mood disorders  The patient's weight, height, BMI, and BP/HR have been recorded in the chart.  I have made referrals, counseling, and provided education to the patient based on review of the above and I have provided the patient with a written personalized care plan for preventive services.     Henrene Pastor, PHARMD, CPP   07/15/2013

## 2013-07-15 NOTE — Patient Instructions (Signed)
Health Maintenance Summary    DEXA / Bone Density Next  Due 07/2015 Done today    TETANUS/TDAP Due Now  Last 2004 - checked cost today $60.47    OPHTHALMOLOGY EXAM DUE NOW        INFLUENZA VACCINE Next Due 09/05/2013      MAMMOGRAM Next Due 09/27/2013      URINE MICROALBUMIN Next Due 10/15/2013      HEMOGLOBIN A1C Next Due 11/11/2013      FOOT EXAM Next Due 05/13/2014      COLONOSCOPY Next Due 04/28/2018        Consider appointment with audiologist (hearing specialist) - to check hearing aid.   Preventive Care for Adults, Female A healthy lifestyle and preventive care can promote health and wellness. Preventive health guidelines for women include the following key practices.  A routine yearly physical is a good way to check with your health care provider about your health and preventive screening. It is a chance to share any concerns and updates on your health and to receive a thorough exam.  Visit your dentist for a routine exam and preventive care every 6 months. Brush your teeth twice a day and floss once a day. Good oral hygiene prevents tooth decay and gum disease.  The frequency of eye exams is based on your age, health, family medical history, use of contact lenses, and other factors. Follow your health care provider's recommendations for frequency of eye exams.  Eat a healthy diet. Foods like vegetables, fruits, whole grains, low-fat dairy products, and lean protein foods contain the nutrients you need without too many calories. Decrease your intake of foods high in solid fats, added sugars, and salt. Eat the right amount of calories for you.Get information about a proper diet from your health care provider, if necessary.  Regular physical exercise is one of the most important things you can do for your health. Most adults should get at least 150 minutes of moderate-intensity exercise (any activity that increases your heart rate and causes you to sweat) each week. In addition, most adults  need muscle-strengthening exercises on 2 or more days a week.  Maintain a healthy weight. The body mass index (BMI) is a screening tool to identify possible weight problems. It provides an estimate of body fat based on height and weight. Your health care provider can find your BMI, and can help you achieve or maintain a healthy weight.For adults 20 years and older:  A BMI below 18.5 is considered underweight.  A BMI of 18.5 to 24.9 is normal.  A BMI of 25 to 29.9 is considered overweight.  A BMI of 30 and above is considered obese.  Maintain normal blood lipids and cholesterol levels by exercising and minimizing your intake of saturated fat. Eat a balanced diet with plenty of fruit and vegetables. Blood tests for lipids and cholesterol should begin at age 34 and be repeated every 5 years. If your lipid or cholesterol levels are high, you are over 50, or you are at high risk for heart disease, you may need your cholesterol levels checked more frequently.Ongoing high lipid and cholesterol levels should be treated with medicines if diet and exercise are not working.  If you smoke, find out from your health care provider how to quit. If you do not use tobacco, do not start.  Lung cancer screening is recommended for adults aged 59 80 years who are at high risk for developing lung cancer because of a history of smoking.  A yearly low-dose CT scan of the lungs is recommended for people who have at least a 30-pack-year history of smoking and are a current smoker or have quit within the past 15 years. A pack year of smoking is smoking an average of 1 pack of cigarettes a day for 1 year (for example: 1 pack a day for 30 years or 2 packs a day for 15 years). Yearly screening should continue until the smoker has stopped smoking for at least 15 years. Yearly screening should be stopped for people who develop a health problem that would prevent them from having lung cancer treatment.  If you are pregnant, do  not drink alcohol. If you are breastfeeding, be very cautious about drinking alcohol. If you are not pregnant and choose to drink alcohol, do not have more than 1 drink per day. One drink is considered to be 12 ounces (355 mL) of beer, 5 ounces (148 mL) of wine, or 1.5 ounces (44 mL) of liquor.  Avoid use of street drugs. Do not share needles with anyone. Ask for help if you need support or instructions about stopping the use of drugs.  High blood pressure causes heart disease and increases the risk of stroke. Your blood pressure should be checked at least every 1 to 2 years. Ongoing high blood pressure should be treated with medicines if weight loss and exercise do not work.  If you are 48 76 years old, ask your health care provider if you should take aspirin to prevent strokes.  Diabetes screening involves taking a blood sample to check your fasting blood sugar level. This should be done once every 3 years, after age 8, if you are within normal weight and without risk factors for diabetes. Testing should be considered at a younger age or be carried out more frequently if you are overweight and have at least 1 risk factor for diabetes.  Breast cancer screening is essential preventive care for women. You should practice "breast self-awareness." This means understanding the normal appearance and feel of your breasts and may include breast self-examination. Any changes detected, no matter how small, should be reported to a health care provider. Women in their 13s and 30s should have a clinical breast exam (CBE) by a health care provider as part of a regular health exam every 1 to 3 years. After age 43, women should have a CBE every year. Starting at age 90, women should consider having a mammogram (breast X-ray test) every year. Women who have a family history of breast cancer should talk to their health care provider about genetic screening. Women at a high risk of breast cancer should talk to their health  care providers about having an MRI and a mammogram every year.  Breast cancer gene (BRCA)-related cancer risk assessment is recommended for women who have family members with BRCA-related cancers. BRCA-related cancers include breast, ovarian, tubal, and peritoneal cancers. Having family members with these cancers may be associated with an increased risk for harmful changes (mutations) in the breast cancer genes BRCA1 and BRCA2. Results of the assessment will determine the need for genetic counseling and BRCA1 and BRCA2 testing.  The Pap test is a screening test for cervical cancer. A Pap test can show cell changes on the cervix that might become cervical cancer if left untreated. A Pap test is a procedure in which cells are obtained and examined from the lower end of the uterus (cervix).  Women should have a Pap test starting at age  21.  Between ages 27 and 21, Pap tests should be repeated every 2 years.  Beginning at age 19, you should have a Pap test every 3 years as long as the past 3 Pap tests have been normal.  Some women have medical problems that increase the chance of getting cervical cancer. Talk to your health care provider about these problems. It is especially important to talk to your health care provider if a new problem develops soon after your last Pap test. In these cases, your health care provider may recommend more frequent screening and Pap tests.  The above recommendations are the same for women who have or have not gotten the vaccine for human papillomavirus (HPV).  If you had a hysterectomy for a problem that was not cancer or a condition that could lead to cancer, then you no longer need Pap tests. Even if you no longer need a Pap test, a regular exam is a good idea to make sure no other problems are starting.  If you are between ages 77 and 33 years, and you have had normal Pap tests going back 10 years, you no longer need Pap tests. Even if you no longer need a Pap test, a  regular exam is a good idea to make sure no other problems are starting.  If you have had past treatment for cervical cancer or a condition that could lead to cancer, you need Pap tests and screening for cancer for at least 20 years after your treatment.  If Pap tests have been discontinued, risk factors (such as a new sexual partner) need to be reassessed to determine if screening should be resumed.  The HPV test is an additional test that may be used for cervical cancer screening. The HPV test looks for the virus that can cause the cell changes on the cervix. The cells collected during the Pap test can be tested for HPV. The HPV test could be used to screen women aged 68 years and older, and should be used in women of any age who have unclear Pap test results. After the age of 12, women should have HPV testing at the same frequency as a Pap test.  Colorectal cancer can be detected and often prevented. Most routine colorectal cancer screening begins at the age of 21 years and continues through age 26 years. However, your health care provider may recommend screening at an earlier age if you have risk factors for colon cancer. On a yearly basis, your health care provider may provide home test kits to check for hidden blood in the stool. Use of a small camera at the end of a tube, to directly examine the colon (sigmoidoscopy or colonoscopy), can detect the earliest forms of colorectal cancer. Talk to your health care provider about this at age 53, when routine screening begins. Direct exam of the colon should be repeated every 5 10 years through age 34 years, unless early forms of pre-cancerous polyps or small growths are found.  People who are at an increased risk for hepatitis B should be screened for this virus. You are considered at high risk for hepatitis B if:  You were born in a country where hepatitis B occurs often. Talk with your health care provider about which countries are considered high  risk.  Your parents were born in a high-risk country and you have not received a shot to protect against hepatitis B (hepatitis B vaccine).  You have HIV or AIDS.  You use needles  to inject street drugs.  You live with, or have sex with, someone who has Hepatitis B.  You get hemodialysis treatment.  You take certain medicines for conditions like cancer, organ transplantation, and autoimmune conditions.  Hepatitis C blood testing is recommended for all people born from [birth year] through 1963 and any individual with known risks for hepatitis C.  Practice safe sex. Use condoms and avoid high-risk sexual practices to reduce the spread of sexually transmitted infections (STIs). STIs include gonorrhea, chlamydia, syphilis, trichomonas, herpes, HPV, and human immunodeficiency virus (HIV). Herpes, HIV, and HPV are viral illnesses that have no cure. They can result in disability, cancer, and death. Sexually active women aged 31 years and younger should be checked for chlamydia. Older women with new or multiple partners should also be tested for chlamydia. Testing for other STIs is recommended if you are sexually active and at increased risk.  Osteoporosis is a disease in which the bones lose minerals and strength with aging. This can result in serious bone fractures or breaks. The risk of osteoporosis can be identified using a bone density scan. Women ages 5 years and over and women at risk for fractures or osteoporosis should discuss screening with their health care providers. Ask your health care provider whether you should take a calcium supplement or vitamin D to reduce the rate of osteoporosis.  Menopause can be associated with physical symptoms and risks. Hormone replacement therapy is available to decrease symptoms and risks. You should talk to your health care provider about whether hormone replacement therapy is right for you.  Use sunscreen. Apply sunscreen liberally and repeatedly throughout the  day. You should seek shade when your shadow is shorter than you. Protect yourself by wearing long sleeves, pants, a wide-brimmed hat, and sunglasses year round, whenever you are outdoors.  Once a month, do a whole body skin exam, using a mirror to look at the skin on your back. Tell your health care provider of new moles, moles that have irregular borders, moles that are larger than a pencil eraser, or moles that have changed in shape or color.  Stay current with required vaccines (immunizations).  Influenza vaccine. All adults should be immunized every year.  Tetanus, diphtheria, and acellular pertussis (Td, Tdap) vaccine. Pregnant women should receive 1 dose of Tdap vaccine during each pregnancy. The dose should be obtained regardless of the length of time since the last dose. Immunization is preferred during the 27th 36th week of gestation. An adult who has not previously received Tdap or who does not know her vaccine status should receive 1 dose of Tdap. This initial dose should be followed by tetanus and diphtheria toxoids (Td) booster doses every 10 years. Adults with an unknown or incomplete history of completing a 3-dose immunization series with Td-containing vaccines should begin or complete a primary immunization series including a Tdap dose. Adults should receive a Td booster every 10 years.  Varicella vaccine. An adult without evidence of immunity to varicella should receive 2 doses or a second dose if she has previously received 1 dose. Pregnant females who do not have evidence of immunity should receive the first dose after pregnancy. This first dose should be obtained before leaving the health care facility. The second dose should be obtained 4 8 weeks after the first dose.  Human papillomavirus (HPV) vaccine. Females aged 37 26 years who have not received the vaccine previously should obtain the 3-dose series. The vaccine is not recommended for use in pregnant females. However,  pregnancy  testing is not needed before receiving a dose. If a female is found to be pregnant after receiving a dose, no treatment is needed. In that case, the remaining doses should be delayed until after the pregnancy. Immunization is recommended for any person with an immunocompromised condition through the age of 33 years if she did not get any or all doses earlier. During the 3-dose series, the second dose should be obtained 4 8 weeks after the first dose. The third dose should be obtained 24 weeks after the first dose and 16 weeks after the second dose.  Zoster vaccine. One dose is recommended for adults aged 17 years or older unless certain conditions are present.  Measles, mumps, and rubella (MMR) vaccine. Adults born before [birth year] generally are considered immune to measles and mumps. Adults born in [birth year] or later should have 1 or more doses of MMR vaccine unless there is a contraindication to the vaccine or there is laboratory evidence of immunity to each of the three diseases. A routine second dose of MMR vaccine should be obtained at least 28 days after the first dose for students attending postsecondary schools, health care workers, or international travelers. People who received inactivated measles vaccine or an unknown type of measles vaccine during 1963 1967 should receive 2 doses of MMR vaccine. People who received inactivated mumps vaccine or an unknown type of mumps vaccine before 1979 and are at high risk for mumps infection should consider immunization with 2 doses of MMR vaccine. For females of childbearing age, rubella immunity should be determined. If there is no evidence of immunity, females who are not pregnant should be vaccinated. If there is no evidence of immunity, females who are pregnant should delay immunization until after pregnancy. Unvaccinated health care workers born before [birth year] who lack laboratory evidence of measles, mumps, or rubella immunity or laboratory confirmation of disease should  consider measles and mumps immunization with 2 doses of MMR vaccine or rubella immunization with 1 dose of MMR vaccine.  Pneumococcal 13-valent conjugate (PCV13) vaccine. When indicated, a person who is uncertain of her immunization history and has no record of immunization should receive the PCV13 vaccine. An adult aged 101 years or older who has certain medical conditions and has not been previously immunized should receive 1 dose of PCV13 vaccine. This PCV13 should be followed with a dose of pneumococcal polysaccharide (PPSV23) vaccine. The PPSV23 vaccine dose should be obtained at least 8 weeks after the dose of PCV13 vaccine. An adult aged 41 years or older who has certain medical conditions and previously received 1 or more doses of PPSV23 vaccine should receive 1 dose of PCV13. The PCV13 vaccine dose should be obtained 1 or more years after the last PPSV23 vaccine dose.  Pneumococcal polysaccharide (PPSV23) vaccine. When PCV13 is also indicated, PCV13 should be obtained first. All adults aged 57 years and older should be immunized. An adult younger than age 70 years who has certain medical conditions should be immunized. Any person who resides in a nursing home or long-term care facility should be immunized. An adult smoker should be immunized. People with an immunocompromised condition and certain other conditions should receive both PCV13 and PPSV23 vaccines. People with human immunodeficiency virus (HIV) infection should be immunized as soon as possible after diagnosis. Immunization during chemotherapy or radiation therapy should be avoided. Routine use of PPSV23 vaccine is not recommended for American Indians, [place] Natives, or people younger than 65 years unless there are medical conditions that  require PPSV23 vaccine. When indicated, people who have unknown immunization and have no record of immunization should receive PPSV23 vaccine. One-time revaccination 5 years after the first dose of PPSV23 is  recommended for people aged 97 64 years who have chronic kidney failure, nephrotic syndrome, asplenia, or immunocompromised conditions. People who received 1 2 doses of PPSV23 before age 37 years should receive another dose of PPSV23 vaccine at age 82 years or later if at least 5 years have passed since the previous dose. Doses of PPSV23 are not needed for people immunized with PPSV23 at or after age 80 years.  Meningococcal vaccine. Adults with asplenia or persistent complement component deficiencies should receive 2 doses of quadrivalent meningococcal conjugate (MenACWY-D) vaccine. The doses should be obtained at least 2 months apart. Microbiologists working with certain meningococcal bacteria, [employer] recruits, people at risk during an outbreak, and people who travel to or live in countries with a high rate of meningitis should be immunized. A first-year college student up through age 51 years who is living in a residence hall should receive a dose if she did not receive a dose on or after her 16th birthday. Adults who have certain high-risk conditions should receive one or more doses of vaccine.  Hepatitis A vaccine. Adults who wish to be protected from this disease, have certain high-risk conditions, work with hepatitis A-infected animals, work in hepatitis A research labs, or travel to or work in countries with a high rate of hepatitis A should be immunized. Adults who were previously unvaccinated and who anticipate close contact with an international adoptee during the first 60 days after arrival in the [place] from a country with a high rate of hepatitis A should be immunized.  Hepatitis B vaccine. Adults who wish to be protected from this disease, have certain high-risk conditions, may be exposed to blood or other infectious body fluids, are household contacts or sex partners of hepatitis B positive people, are clients or workers in certain care facilities, or travel to or work in countries  with a high rate of hepatitis B should be immunized.     Fall Prevention and Home Safety Falls cause injuries and can affect all age groups. It is possible to use preventive measures to significantly decrease the likelihood of falls. There are many simple measures which can make your home safer and prevent falls. OUTDOORS  Repair cracks and edges of walkways and driveways.  Remove high doorway thresholds.  Trim shrubbery on the main path into your home.  Have good outside lighting.  Clear walkways of tools, rocks, debris, and clutter.  Check that handrails are not broken and are securely fastened. Both sides of steps should have handrails.  Have leaves, snow, and ice cleared regularly.  Use sand or salt on walkways during winter months.  In the garage, clean up grease or oil spills. BATHROOM  Install night lights.  Install grab bars by the toilet and in the tub and shower.  Use non-skid mats or decals in the tub or shower.  Place a plastic non-slip stool in the shower to sit on, if needed.  Keep floors dry and clean up all water on the floor immediately.  Remove soap buildup in the tub or shower on a regular basis.  Secure bath mats with non-slip, double-sided rug tape.  Remove throw rugs and tripping hazards from the floors. BEDROOMS  Install night lights.  Make sure a bedside light is easy to reach.  Do not use oversized bedding.  Keep a telephone by your bedside.  Have a firm chair with side arms to use for getting dressed.  Remove throw rugs and tripping hazards from the floor. KITCHEN  Keep handles on pots and pans turned toward the center of the stove. Use back burners when possible.  Clean up spills quickly and allow time for drying.  Avoid walking on wet floors.  Avoid hot utensils and knives.  Position shelves so they are not too high or low.  Place commonly used objects within easy reach.  If necessary, use a sturdy step stool with a  grab bar when reaching.  Keep electrical cables out of the way.  Do not use floor polish or wax that makes floors slippery. If you must use wax, use non-skid floor wax.  Remove throw rugs and tripping hazards from the floor. STAIRWAYS  Never leave objects on stairs.  Place handrails on both sides of stairways and use them. Fix any loose handrails. Make sure handrails on both sides of the stairways are as long as the stairs.  Check carpeting to make sure it is firmly attached along stairs. Make repairs to worn or loose carpet promptly.  Avoid placing throw rugs at the top or bottom of stairways, or properly secure the rug with carpet tape to prevent slippage. Get rid of throw rugs, if possible.  Have an electrician put in a light switch at the top and bottom of the stairs. OTHER FALL PREVENTION TIPS  Wear low-heel or rubber-soled shoes that are supportive and fit well. Wear closed toe shoes.  When using a stepladder, make sure it is fully opened and both spreaders are firmly locked. Do not climb a closed stepladder.  Add color or contrast paint or tape to grab bars and handrails in your home. Place contrasting color strips on first and last steps.  Learn and use mobility aids as needed. Install an electrical emergency response system.  Turn on lights to avoid dark areas. Replace light bulbs that burn out immediately. Get light switches that glow.  Arrange furniture to create clear pathways. Keep furniture in the same place.  Firmly attach carpet with non-skid or double-sided tape.  Eliminate uneven floor surfaces.  Select a carpet pattern that does not visually hide the edge of steps.  Be aware of all pets. OTHER HOME SAFETY TIPS  Set the water temperature for 120 F (48.8 C).  Keep emergency numbers on or near the telephone.  Keep smoke detectors on every level of the home and near sleeping areas. Document Released: 01/12/2002 Document Revised: 07/24/2011 Document  Reviewed: 04/13/2011 [facility] Patient Information 2014 [place].

## 2013-07-16 ENCOUNTER — Telehealth: Payer: Self-pay | Admitting: Pharmacist

## 2013-07-16 LAB — THYROID PANEL WITH TSH
FREE THYROXINE INDEX: 2.4 (ref 1.2–4.9)
T3 Uptake Ratio: 24 % (ref 24–39)
T4, Total: 9.9 ug/dL (ref 4.5–12.0)
TSH: 1.61 u[IU]/mL (ref 0.450–4.500)

## 2013-07-16 NOTE — Telephone Encounter (Signed)
Thyroid panel wnl - recommend continue current levothyroxine dose of daily . Patient notified

## 2013-08-08 ENCOUNTER — Other Ambulatory Visit: Payer: Self-pay | Admitting: Nurse Practitioner

## 2013-08-11 ENCOUNTER — Telehealth: Payer: Self-pay | Admitting: Nurse Practitioner

## 2013-08-11 MED ORDER — ROSUVASTATIN CALCIUM 20 MG PO TABS: 10.0000 mg | ORAL_TABLET | Freq: Every day | ORAL | Status: DC

## 2013-08-11 NOTE — Telephone Encounter (Signed)
Left message samples up front

## 2013-09-21 ENCOUNTER — Telehealth: Payer: Self-pay | Admitting: Nurse Practitioner

## 2013-09-21 NOTE — Telephone Encounter (Signed)
Patient aware we do not have any samples at this time. 

## 2013-09-24 ENCOUNTER — Telehealth: Payer: Self-pay | Admitting: Nurse Practitioner

## 2013-09-24 NOTE — Telephone Encounter (Signed)
No samples at the moment

## 2013-09-29 NOTE — Telephone Encounter (Signed)
Pt.notified

## 2013-11-01 ENCOUNTER — Other Ambulatory Visit: Payer: Self-pay | Admitting: Family Medicine

## 2013-11-02 NOTE — Telephone Encounter (Signed)
Patient last seen in office on 05-16-13. Please advise on 90 day refill

## 2013-11-09 ENCOUNTER — Telehealth: Payer: Self-pay | Admitting: Nurse Practitioner

## 2013-11-09 NOTE — Telephone Encounter (Signed)
No Crestor samples available at this time. Patient aware.

## 2013-11-17 ENCOUNTER — Telehealth: Payer: Self-pay | Admitting: Family Medicine

## 2013-11-18 NOTE — Telephone Encounter (Signed)
No samples at this time cant leave message

## 2013-11-28 ENCOUNTER — Other Ambulatory Visit: Payer: Self-pay | Admitting: Family Medicine

## 2013-11-30 NOTE — Telephone Encounter (Signed)
no more refills without being seen  

## 2013-11-30 NOTE — Telephone Encounter (Signed)
Last seen last glucose 05/12/13 MMM

## 2013-12-22 ENCOUNTER — Telehealth: Payer: Self-pay | Admitting: Nurse Practitioner

## 2013-12-22 NOTE — Telephone Encounter (Signed)
No crestor samples available.

## 2013-12-23 LAB — HM DIABETES EYE EXAM

## 2013-12-23 NOTE — Addendum Note (Signed)
Addended by: Orma RenderHODGES, Jonluke Cobbins F on: 12/23/2013 11:37 AM   Modules accepted: Orders

## 2013-12-23 NOTE — Addendum Note (Signed)
Addended by: Orma RenderHODGES, Itzae Miralles F on: 12/23/2013 11:39 AM   Modules accepted: Orders

## 2013-12-28 ENCOUNTER — Other Ambulatory Visit: Payer: Medicare Other

## 2013-12-28 DIAGNOSIS — E119 Type 2 diabetes mellitus without complications: Secondary | ICD-10-CM

## 2013-12-28 NOTE — Progress Notes (Signed)
Lab only 

## 2013-12-29 LAB — MICROALBUMIN, URINE: MICROALBUM., U, RANDOM: 49.7 ug/mL — AB (ref 0.0–17.0)

## 2014-01-06 ENCOUNTER — Telehealth: Payer: Self-pay | Admitting: Nurse Practitioner

## 2014-01-06 NOTE — Telephone Encounter (Signed)
Stp advised we are currently out of samples of the crestor but should have some by next week.

## 2014-01-22 ENCOUNTER — Encounter: Payer: Self-pay | Admitting: Nurse Practitioner

## 2014-01-22 ENCOUNTER — Ambulatory Visit (INDEPENDENT_AMBULATORY_CARE_PROVIDER_SITE_OTHER): Payer: Medicare Other | Admitting: Nurse Practitioner

## 2014-01-22 VITALS — BP 143/89 | HR 66 | Temp 97.3°F | Ht 61.0 in | Wt 146.0 lb

## 2014-01-22 DIAGNOSIS — E785 Hyperlipidemia, unspecified: Secondary | ICD-10-CM

## 2014-01-22 DIAGNOSIS — E119 Type 2 diabetes mellitus without complications: Secondary | ICD-10-CM

## 2014-01-22 DIAGNOSIS — E039 Hypothyroidism, unspecified: Secondary | ICD-10-CM

## 2014-01-22 DIAGNOSIS — I10 Essential (primary) hypertension: Secondary | ICD-10-CM

## 2014-01-22 LAB — POCT GLYCOSYLATED HEMOGLOBIN (HGB A1C): Hemoglobin A1C: 5.7

## 2014-01-22 MED ORDER — LEVOTHYROXINE SODIUM 88 MCG PO TABS: ORAL_TABLET | ORAL | Status: DC

## 2014-01-22 MED ORDER — ROSUVASTATIN CALCIUM 10 MG PO TABS: 10.0000 mg | ORAL_TABLET | Freq: Every day | ORAL | Status: DC

## 2014-01-22 MED ORDER — METFORMIN HCL ER 500 MG PO TB24: ORAL_TABLET | ORAL | Status: DC

## 2014-01-22 MED ORDER — ATENOLOL 50 MG PO TABS: 50.0000 mg | ORAL_TABLET | Freq: Every day | ORAL | Status: DC

## 2014-01-22 NOTE — Progress Notes (Signed)
Subjective:    Patient ID: Michele Walters, female    DOB: 11/06/1935, 78 y.o.   MRN: [id number]  Patient here today for follow up pf multiple medical problems- SHe is doing well- no changes since last visit.  Hyperlipidemia This is a chronic problem. The current episode started more than 1 year ago. The problem is controlled. Exacerbating diseases include diabetes. She has no history of hypothyroidism. Pertinent negatives include no chest pain or shortness of breath. Current antihyperlipidemic treatment includes statins. The current treatment provides moderate improvement of lipids. Compliance problems include adherence to diet and adherence to exercise.  Risk factors for coronary artery disease include diabetes mellitus, dyslipidemia, hypertension and obesity.  Thyroid Problem Visit type: hypothyroidism. Patient reports no constipation, diaphoresis, diarrhea, heat intolerance, menstrual problem or palpitations. The symptoms have been stable. Her past medical history is significant for diabetes and hyperlipidemia.  Hypertension This is a chronic problem. The current episode started more than 1 year ago. The problem is controlled. Pertinent negatives include no chest pain, headaches, neck pain, palpitations or shortness of breath. Risk factors for coronary artery disease include dyslipidemia, diabetes mellitus, obesity and post-menopausal state. Past treatments include beta blockers. The current treatment provides moderate improvement. Compliance problems include diet and exercise.  Hypertensive end-organ damage includes a thyroid problem.  Diabetes She presents for her follow-up diabetic visit. She has type 2 diabetes mellitus. No MedicAlert identification noted. Pertinent negatives for hypoglycemia include no headaches. Pertinent negatives for diabetes include no chest pain. Symptoms are stable. Her weight is stable. When asked about meal planning, she reported none. She has not had a previous visit  with a dietitian. She rarely participates in exercise. Her breakfast blood glucose is taken between 8-9 am. Her breakfast blood glucose range is generally 130-140 mg/dl. Her highest blood glucose is >200 mg/dl. Her overall blood glucose range is 130-140 mg/dl. An ACE inhibitor/angiotensin II receptor blocker is not being taken. She does not see a podiatrist.Eye exam is not current.   Review of Systems  Constitutional: Negative for diaphoresis.  Respiratory: Negative for shortness of breath.   Cardiovascular: Negative for chest pain and palpitations.  Gastrointestinal: Negative for diarrhea and constipation.  Endocrine: Negative for heat intolerance.  Genitourinary: Negative for menstrual problem.  Musculoskeletal: Negative for neck pain.  Neurological: Negative for headaches.       Objective:   Physical Exam  Constitutional: She is oriented to person, place, and time. She appears well-developed and well-nourished.  HENT:  Nose: Nose normal.  Mouth/Throat: Oropharynx is clear and moist.  Eyes: EOM are normal.  Neck: Trachea normal, normal range of motion and full passive range of motion without pain. Neck supple. No JVD present. Carotid bruit is not present. No thyromegaly present.  Cardiovascular: Normal rate, regular rhythm, normal heart sounds and intact distal pulses.  Exam reveals no gallop and no friction rub.   No murmur heard. Pulmonary/Chest: Effort normal and breath sounds normal.  Abdominal: Soft. Bowel sounds are normal. She exhibits no distension and no mass. There is no tenderness.  Musculoskeletal: Normal range of motion.  Lymphadenopathy:    She has no cervical adenopathy.  Neurological: She is alert and oriented to person, place, and time. She has normal reflexes.  Skin: Skin is warm and dry.  Psychiatric: She has a normal mood and affect. Her behavior is normal. Judgment and thought content normal.    BP 143/89 mmHg  Pulse 66  Temp(Src) 97.3 F (36.3 C) (Oral)   Ht 5'  1" (1.549 m)  Wt 146 lb (66.225 kg)  BMI 27.60 kg/m2         Assessment & Plan:  1. Essential hypertension Do ot add salt to diet - atenolol (TENORMIN) 50 MG tablet; Take 1 tablet (50 mg total) by mouth daily.  Dispense: 90 tablet; Refill: 1  2. Hyperlipidemia Low fat diet - rosuvastatin (CRESTOR) 10 MG tablet; Take 1 tablet (10 mg total) by mouth daily.  Dispense: 30 tablet; Refill: 5  3. Hypothyroidism, unspecified hypothyroidism type - levothyroxine (SYNTHROID, LEVOTHROID) 88 MCG tablet; TAKE 1 TABLET (88 MCG TOTAL) BY MOUTH DAILY BEFORE BREAKFAST.  Dispense: 90 tablet; Refill: 1  4. Type 2 diabetes mellitus without complication contintue to watch carbs in diet - metFORMIN (GLUCOPHAGE-XR) 500 MG 24 hr tablet; TAKE 1 TABLET (500 MG TOTAL) BY MOUTH DAILY WITH BREAKFAST.  Dispense: 90 tablet; Refill: 1    Labs pending Health maintenance reviewed Diet and exercise encouraged Continue all meds Follow up  In 3 month   Mary-Margaret Daphine DeutscherMartin, FNP

## 2014-01-22 NOTE — Patient Instructions (Signed)

## 2014-01-23 LAB — CMP14+EGFR
A/G RATIO: 1.4 (ref 1.1–2.5)
ALK PHOS: 87 IU/L (ref 39–117)
ALT: 10 IU/L (ref 0–32)
AST: 15 IU/L (ref 0–40)
Albumin: 4.3 g/dL (ref 3.5–4.8)
BILIRUBIN TOTAL: 0.4 mg/dL (ref 0.0–1.2)
BUN / CREAT RATIO: 16 (ref 11–26)
BUN: 18 mg/dL (ref 8–27)
CO2: 23 mmol/L (ref 18–29)
CREATININE: 1.13 mg/dL — AB (ref 0.57–1.00)
Calcium: 10.4 mg/dL — ABNORMAL HIGH (ref 8.7–10.3)
Chloride: 99 mmol/L (ref 97–108)
GFR calc non Af Amer: 47 mL/min/{1.73_m2} — ABNORMAL LOW (ref >59)
GFR, EST AFRICAN AMERICAN: 55 mL/min/{1.73_m2} — AB (ref >59)
GLOBULIN, TOTAL: 3 g/dL (ref 1.5–4.5)
Glucose: 129 mg/dL — ABNORMAL HIGH (ref 65–99)
Potassium: 4.8 mmol/L (ref 3.5–5.2)
SODIUM: 138 mmol/L (ref 134–144)
Total Protein: 7.3 g/dL (ref 6.0–8.5)

## 2014-01-23 LAB — NMR, LIPOPROFILE
CHOLESTEROL: 212 mg/dL — AB (ref 100–199)
HDL CHOLESTEROL BY NMR: 39 mg/dL — AB (ref >39)
HDL Particle Number: 22.9 umol/L — ABNORMAL LOW (ref >=30.5)
LDL Particle Number: 1421 nmol/L — ABNORMAL HIGH (ref <1000)
LDL Size: 21.6 nm (ref >20.5)
LDL-C: 138 mg/dL — ABNORMAL HIGH (ref 0–99)
LP-IR Score: 33 (ref <=45)
Small LDL Particle Number: 467 nmol/L (ref <=527)
TRIGLYCERIDES BY NMR: 177 mg/dL — AB (ref 0–149)

## 2014-01-25 ENCOUNTER — Other Ambulatory Visit: Payer: Self-pay | Admitting: Nurse Practitioner

## 2014-01-25 MED ORDER — ATORVASTATIN CALCIUM 40 MG PO TABS: 40.0000 mg | ORAL_TABLET | Freq: Every day | ORAL | Status: DC

## 2014-03-08 ENCOUNTER — Other Ambulatory Visit: Payer: Self-pay | Admitting: Nurse Practitioner

## 2014-05-22 ENCOUNTER — Other Ambulatory Visit: Payer: Self-pay | Admitting: Nurse Practitioner

## 2014-07-29 ENCOUNTER — Ambulatory Visit (INDEPENDENT_AMBULATORY_CARE_PROVIDER_SITE_OTHER): Payer: Medicare Other | Admitting: Nurse Practitioner

## 2014-07-29 ENCOUNTER — Encounter: Payer: Self-pay | Admitting: Nurse Practitioner

## 2014-07-29 VITALS — BP 146/82 | HR 78 | Temp 96.5°F | Ht 61.0 in | Wt 145.0 lb

## 2014-07-29 DIAGNOSIS — E119 Type 2 diabetes mellitus without complications: Secondary | ICD-10-CM | POA: Diagnosis not present

## 2014-07-29 DIAGNOSIS — E039 Hypothyroidism, unspecified: Secondary | ICD-10-CM | POA: Diagnosis not present

## 2014-07-29 DIAGNOSIS — E785 Hyperlipidemia, unspecified: Secondary | ICD-10-CM | POA: Diagnosis not present

## 2014-07-29 DIAGNOSIS — Z23 Encounter for immunization: Secondary | ICD-10-CM | POA: Diagnosis not present

## 2014-07-29 DIAGNOSIS — I10 Essential (primary) hypertension: Secondary | ICD-10-CM

## 2014-07-29 LAB — POCT GLYCOSYLATED HEMOGLOBIN (HGB A1C): Hemoglobin A1C: 5.9

## 2014-07-29 MED ORDER — ROSUVASTATIN CALCIUM 10 MG PO TABS: 10.0000 mg | ORAL_TABLET | Freq: Every day | ORAL | Status: DC

## 2014-07-29 MED ORDER — ATENOLOL 50 MG PO TABS: 50.0000 mg | ORAL_TABLET | Freq: Every day | ORAL | Status: DC

## 2014-07-29 MED ORDER — LEVOTHYROXINE SODIUM 88 MCG PO TABS: ORAL_TABLET | ORAL | Status: DC

## 2014-07-29 MED ORDER — ATORVASTATIN CALCIUM 40 MG PO TABS: 40.0000 mg | ORAL_TABLET | Freq: Every day | ORAL | Status: DC

## 2014-07-29 MED ORDER — METFORMIN HCL ER 500 MG PO TB24: ORAL_TABLET | ORAL | Status: DC

## 2014-07-29 NOTE — Progress Notes (Signed)
Subjective:    Patient ID: Michele Walters, female    DOB: 09-06-1935, 79 y.o.   MRN: [id number]  Patient here today for follow up pf multiple medical problems- SHe is doing well- no changes since last visit.  Hyperlipidemia This is a chronic problem. The current episode started more than 1 year ago. The problem is controlled. Exacerbating diseases include diabetes. She has no history of hypothyroidism. Pertinent negatives include no chest pain or shortness of breath. Current antihyperlipidemic treatment includes statins. The current treatment provides moderate improvement of lipids. Compliance problems include adherence to diet and adherence to exercise.  Risk factors for coronary artery disease include diabetes mellitus, dyslipidemia, hypertension and obesity.  Thyroid Problem Visit type: hypothyroidism. Patient reports no constipation, diaphoresis, diarrhea, heat intolerance, menstrual problem or palpitations. The symptoms have been stable. Her past medical history is significant for diabetes and hyperlipidemia.  Hypertension This is a chronic problem. The current episode started more than 1 year ago. The problem is controlled. Pertinent negatives include no chest pain, headaches, neck pain, palpitations or shortness of breath. Risk factors for coronary artery disease include dyslipidemia, diabetes mellitus, obesity and post-menopausal state. Past treatments include beta blockers. The current treatment provides moderate improvement. Compliance problems include diet and exercise.  Hypertensive end-organ damage includes a thyroid problem.  Diabetes She presents for her follow-up diabetic visit. She has type 2 diabetes mellitus. No MedicAlert identification noted. Pertinent negatives for hypoglycemia include no headaches. Pertinent negatives for diabetes include no chest pain. Symptoms are stable. Her weight is stable. When asked about meal planning, she reported none. She has not had a previous visit  with a dietitian. She rarely participates in exercise. Her breakfast blood glucose is taken between 8-9 am. Her breakfast blood glucose range is generally 130-140 mg/dl. Her highest blood glucose is >200 mg/dl. Her overall blood glucose range is 130-140 mg/dl. An ACE inhibitor/angiotensin II receptor blocker is not being taken. She does not see a podiatrist.Eye exam is not current.     Review of Systems  Constitutional: Negative for diaphoresis.  Respiratory: Negative for shortness of breath.   Cardiovascular: Negative for chest pain and palpitations.  Gastrointestinal: Negative for diarrhea and constipation.  Endocrine: Negative for heat intolerance.  Genitourinary: Negative for menstrual problem.  Musculoskeletal: Negative for neck pain.  Neurological: Negative for headaches.       Objective:   Physical Exam  Constitutional: She is oriented to person, place, and time. She appears well-developed and well-nourished.  HENT:  Nose: Nose normal.  Mouth/Throat: Oropharynx is clear and moist.  Eyes: EOM are normal.  Neck: Trachea normal, normal range of motion and full passive range of motion without pain. Neck supple. No JVD present. Carotid bruit is not present. No thyromegaly present.  Cardiovascular: Normal rate, regular rhythm, normal heart sounds and intact distal pulses.  Exam reveals no gallop and no friction rub.   No murmur heard. Pulmonary/Chest: Effort normal and breath sounds normal.  Abdominal: Soft. Bowel sounds are normal. She exhibits no distension and no mass. There is no tenderness.  Musculoskeletal: Normal range of motion.  Lymphadenopathy:    She has no cervical adenopathy.  Neurological: She is alert and oriented to person, place, and time. She has normal reflexes.  Skin: Skin is warm and dry.  Psychiatric: She has a normal mood and affect. Her behavior is normal. Judgment and thought content normal.    BP 146/82 mmHg  Pulse 78  Temp(Src) 96.5 F (35.8 C)  (Oral)  Ht _0  (1.549 m)  Wt 145 lb (65.772 kg)  BMI 27.41 kg/m2   Results for orders placed or performed in visit on 07/29/14  POCT glycosylated hemoglobin (Hb A1C)  Result Value Ref Range   Hemoglobin A1C 5.9           Assessment & Plan:  1. Essential hypertension Do not add salt to diet - CMP14+EGFR - atenolol (TENORMIN) 50 MG tablet; Take 1 tablet (50 mg total) by mouth daily.  Dispense: 90 tablet; Refill: 1  2. Hyperlipidemia Low fat diet - Lipid panel - rosuvastatin (CRESTOR) 10 MG tablet; Take 1 tablet (10 mg total) by mouth daily.  Dispense: 30 tablet; Refill: 5 - atorvastatin (LIPITOR) 40 MG tablet; Take 1 tablet (40 mg total) by mouth daily.  Dispense: 90 tablet; Refill: 1  3. Type 2 diabetes mellitus without complication Continue to watch carbsin diet - POCT glycosylated hemoglobin (Hb A1C) - metFORMIN (GLUCOPHAGE-XR) 500 MG 24 hr tablet; TAKE 1 TABLET (500 MG TOTAL) BY MOUTH DAILY WITH BREAKFAST.  Dispense: 90 tablet; Refill: 1  4. Hypothyroidism, unspecified hypothyroidism type - levothyroxine (SYNTHROID, LEVOTHROID) 88 MCG tablet; TAKE 1 TABLET (88 MCG TOTAL) BY MOUTH DAILY BEFORE BREAKFAST.  Dispense: 90 tablet; Refill: 1    Labs pending Health maintenance reviewed Diet and exercise encouraged Continue all meds Follow up  In 3 months    [place], FNP

## 2014-07-29 NOTE — Patient Instructions (Signed)

## 2014-07-29 NOTE — Addendum Note (Signed)
Addended by: Cleda Daub on: 07/29/2014 12:13 PM   Modules accepted: Orders

## 2014-07-30 LAB — CMP14+EGFR
A/G RATIO: 1.3 (ref 1.1–2.5)
ALK PHOS: 83 IU/L (ref 39–117)
ALT: 14 IU/L (ref 0–32)
AST: 23 IU/L (ref 0–40)
Albumin: 3.9 g/dL (ref 3.5–4.8)
BILIRUBIN TOTAL: 0.5 mg/dL (ref 0.0–1.2)
BUN / CREAT RATIO: 18 (ref 11–26)
BUN: 25 mg/dL (ref 8–27)
CHLORIDE: 99 mmol/L (ref 97–108)
CO2: 21 mmol/L (ref 18–29)
Calcium: 9.1 mg/dL (ref 8.7–10.3)
Creatinine, Ser: 1.36 mg/dL — ABNORMAL HIGH (ref 0.57–1.00)
GFR calc non Af Amer: 38 mL/min/{1.73_m2} — ABNORMAL LOW (ref >59)
GFR, EST AFRICAN AMERICAN: 44 mL/min/{1.73_m2} — AB (ref >59)
Globulin, Total: 2.9 g/dL (ref 1.5–4.5)
Glucose: 199 mg/dL — ABNORMAL HIGH (ref 65–99)
POTASSIUM: 4.4 mmol/L (ref 3.5–5.2)
SODIUM: 138 mmol/L (ref 134–144)
Total Protein: 6.8 g/dL (ref 6.0–8.5)

## 2014-07-30 LAB — LIPID PANEL
CHOL/HDL RATIO: 3 ratio (ref 0.0–4.4)
CHOLESTEROL TOTAL: 126 mg/dL (ref 100–199)
HDL: 42 mg/dL (ref >39)
LDL CALC: 54 mg/dL (ref 0–99)
Triglycerides: 152 mg/dL — ABNORMAL HIGH (ref 0–149)
VLDL Cholesterol Cal: 30 mg/dL (ref 5–40)

## 2014-07-31 ENCOUNTER — Other Ambulatory Visit: Payer: Self-pay | Admitting: Nurse Practitioner

## 2014-08-03 ENCOUNTER — Telehealth: Payer: Self-pay | Admitting: *Deleted

## 2014-08-03 NOTE — Telephone Encounter (Signed)
-----   [facility], FNP sent at 07/31/2014  5:06 PM EDT ----- Hgba1c discussed at appointment Kidney and liver function stable Creatine up slightly from last check- No NSADS Continue current meds- low fat diet and exercise and recheck in 3 months

## 2014-08-03 NOTE — Telephone Encounter (Signed)
Pt notified of results

## 2014-09-25 ENCOUNTER — Other Ambulatory Visit: Payer: Self-pay | Admitting: Nurse Practitioner

## 2014-10-20 ENCOUNTER — Encounter: Payer: Self-pay | Admitting: Pharmacist

## 2014-10-20 ENCOUNTER — Ambulatory Visit (INDEPENDENT_AMBULATORY_CARE_PROVIDER_SITE_OTHER): Payer: Medicare Other | Admitting: Pharmacist

## 2014-10-20 VITALS — BP 142/82 | HR 76 | Ht 61.0 in | Wt 145.0 lb

## 2014-10-20 DIAGNOSIS — E034 Atrophy of thyroid (acquired): Secondary | ICD-10-CM

## 2014-10-20 DIAGNOSIS — E119 Type 2 diabetes mellitus without complications: Secondary | ICD-10-CM | POA: Diagnosis not present

## 2014-10-20 DIAGNOSIS — Z0000 Encounter for general adult medical examination without abnormal findings: Secondary | ICD-10-CM | POA: Diagnosis not present

## 2014-10-20 DIAGNOSIS — E038 Other specified hypothyroidism: Secondary | ICD-10-CM | POA: Diagnosis not present

## 2014-10-20 DIAGNOSIS — N183 Chronic kidney disease, stage 3 unspecified: Secondary | ICD-10-CM | POA: Insufficient documentation

## 2014-10-20 DIAGNOSIS — N184 Chronic kidney disease, stage 4 (severe): Secondary | ICD-10-CM

## 2014-10-20 MED ORDER — AMLODIPINE BESYLATE 5 MG PO TABS: 2.5000 mg | ORAL_TABLET | Freq: Every day | ORAL | Status: DC

## 2014-10-20 NOTE — Progress Notes (Signed)
Patient ID: Michele Walters, female   DOB: July 07, 1935, 79 y.o.   MRN: [id number]    Subjective:   Michele Walters is a 79 y.o. female who presents for a Subsequent Medicare Annual Wellness Visit.  She lives alone in [place] but her sister lives just next door and is very close if she need someone.  Michele Walters is retired and previously worked in 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  She denies any acute medical concerns today. She states that she feels good and that she is doing well for her age.  Current Medications (verified) Outpatient Encounter Prescriptions as of 10/20/2014  Medication Sig  . aspirin 81 MG tablet Take 81 mg by mouth daily.  Marland Kitchen atorvastatin (LIPITOR) 40 MG tablet Take 1 tablet (40 mg total) by mouth daily.  . calcium carbonate (OS-CAL) 600 MG TABS tablet Take 600 mg by mouth daily.  . cetirizine (ZYRTEC) 10 MG tablet Take 10 mg by mouth daily as needed.  . [facility] Lancets Ultra Thin MISC Check blood sugar daily and PRN  . levothyroxine (SYNTHROID, LEVOTHROID) 88 MCG tablet TAKE 1 TABLET (88 MCG TOTAL) BY MOUTH DAILY BEFORE BREAKFAST.  . metFORMIN (GLUCOPHAGE-XR) 500 MG 24 hr tablet TAKE 1 TABLET (500 MG TOTAL) BY MOUTH DAILY WITH BREAKFAST.  Marland Kitchen Omega-3 Fatty Acids (FISH OIL) 1000 MG CAPS Take 1 capsule by mouth daily.  Letta Pate VERIO test strip TEST ONCE A DAY  . atenolol (TENORMIN) 50 MG tablet Take 1 tablet (50 mg total) by mouth daily. (Patient taking differently: Take 25 mg by mouth daily. )  .    .  acyclovir (ZOVIRAX) 800 MG tablet Take 1 tablet (800 mg total) by mouth 5 (five) times daily. (Patient not taking: Reported on 10/20/2014)  .  ONETOUCH VERIO test strip TEST ONCE A DAY  .  rosuvastatin (CRESTOR) 10 MG tablet Take 1 tablet (10 mg total) by mouth daily. (Patient not taking: Reported on 10/20/2014)   No facility-administered encounter medications on file as of 10/20/2014.    Allergies (verified) Review of patient's allergies  indicates no known allergies.   History: Past Medical History  Diagnosis Date  . Thyroid disease   . Hypertension   . Hyperlipidemia   . Diabetes mellitus without complication   . Cataract   . Allergy    Past Surgical History  Procedure Laterality Date  . Abdominal hysterectomy      partial but later ovaries removed  . Oophorectomy Bilateral   . Eye surgery Bilateral     cataract   Family History  Problem Relation Age of Onset  . Heart disease Mother   . COPD Father   . Emphysema Father   . Down syndrome Brother    Social History   Occupational History  . Not on file.   Social History Main Topics  . Smoking status: Current Every Day Smoker -- 0.50 packs/day for 55 years    Types: Cigarettes  . Smokeless tobacco: Never Used  . Alcohol Use: Yes  . Drug Use: No  . Sexual Activity: No    Do you feel safe at home?  Yes  Dietary issues and exercise activities: Current Exercise Habits:: Home exercise routine, Type of exercise: walking, Time (Minutes): 25, Frequency (Times/Week): 6, Weekly Exercise (Minutes/Week): 150, Intensity: Moderate  Current Dietary habits:  Patient tries to limit sweets   Objective:    Today's Vitals   10/20/14 0832  BP: 142/82  Pulse: 76  Height: 5\' 1"  (1.549 m)  Weight: 145 lb (65.772 kg)  PainSc: 0-No pain   Body mass index is 27.41 kg/(m^2).  Activities of Daily Living In your present state of health, do you have any difficulty performing the following activities: 10/20/2014 07/29/2014  Hearing? N N  Vision? N N  Difficulty concentrating or making decisions? N N  Walking or climbing stairs? N N  Dressing or bathing? N N  Doing errands, shopping? N N  Preparing Food and eating ? N -  Using the Toilet? N -  In the past six months, have you accidently leaked urine? N -  Do you have problems with loss of bowel control? N -  Managing your Medications? N -  Managing your Finances? N -  Housekeeping or managing your Housekeeping? N  -    Are there smokers in your home (other than you)? No   Cardiac Risk Factors include: advanced age (>68men, >53 women);diabetes mellitus;dyslipidemia;hypertension;microalbuminuria;smoking/ tobacco exposure  Depression Screen PHQ 2/9 Scores 10/20/2014 07/29/2014 01/22/2014 07/15/2013  PHQ - 2 Score 0 0 0 0    Fall Risk Fall Risk  10/20/2014 07/29/2014 01/22/2014 07/15/2013 05/12/2013  Falls in the past year? No No No No No  Risk for fall due to : - - - Medication side effect -    Cognitive Function: MMSE - Mini Mental State Exam 10/20/2014  Orientation to time 5  Orientation to Place 5  Registration 3  Attention/ Calculation 5  Recall 3  Language- name 2 objects 2  Language- repeat 1  Language- follow 3 step command 3  Language- read & follow direction 1  Write a sentence 1  Copy design 0  Total score 29    Immunizations and Health Maintenance Immunization History  Administered Date(s) Administered  . Influenza, High Dose Seasonal PF 10/16/2012, 11/03/2013  . Pneumococcal Conjugate-13 07/29/2014  . Zoster 03/14/2008   Health Maintenance Due  Topic Date Due  . INFLUENZA VACCINE  09/06/2014    Patient Care Team: Bennie Pierini, FNP as PCP - General (Nurse Practitioner) Derryl [place], OD as Consulting Physician (Optometry)  Indicate any recent Medical Services you may have received from other than [facility] providers in the past year (date may be approximate).    Assessment:    Annual Wellness Visit  Chronic kidney disease - stage 3 Urine microalbuminuria HTN Medication Management - not taking atenolol according to directions   Screening Tests Health Maintenance  Topic Date Due  . INFLUENZA VACCINE  09/06/2014  . TETANUS/TDAP  01/28/2015 (Originally 02/06/2012)  . OPHTHALMOLOGY EXAM  12/24/2014  . URINE MICROALBUMIN  12/29/2014  . HEMOGLOBIN A1C  01/28/2015  . FOOT EXAM  07/29/2015  . PNA vac Low Risk Adult (2 of 2 - PPSV23) 07/29/2015  . COLONOSCOPY   04/28/2018  . DEXA SCAN  Completed  . ZOSTAVAX  Completed        Plan:   During the course of the visit Michele Walters was educated and counseled about the following appropriate screening and preventive services:   Vaccines to include Pneumoccal, Influenza, Hepatitis B, Td, Zostavax.  Patient to get influenza vaccine next month at visit with PCP (we do not have vaccine yet).  Discussed Tdap - checked cost which was $61.54 - patient declined due to cost)  Colorectal cancer screening - FOBT given in office today  Cardiovascular disease screening - lipids UTD.  D/C atenolol.  Start amlodipine 5mg  take 1/2 tablet daily.  Patient to follow up  with PCP in October 2016.  (Considered ACEI / ARB but due to elevated serum creatinine will use amlodipine instead)   Diabetes - A1c UTD and looks great.  Urine microalbumin rechecked today  Bone Denisty / Osteoporosis Screening - UTD  Mammogram - patient refuses.  She continues to perform breast exams.  Educated to notify office is she notices any abnormalities or experiences pain or discharge.  PAP - no longer required  Glaucoma screening / Diabetic Eye Exam - reminded that she is due next eye exam in November 2016.  Nutrition counseling - reviewed foods that increase BP/BG.  Patient encouraged to increase non starchy vegetables and whole grains.  Smoking cessation counseling - patient declined  Advanced Directives - information provided for patient to review with family.  She is pretty sure she has advanced directives and will bring copy to office in future.  Continue walking daily - great job!   Orders Placed This Encounter  Procedures  . Thyroid Panel With TSH  . Microalbumin / creatinine urine ratio       Patient Instructions (the written plan) were given to the patient.   Henrene Pastor, PHARMD, CPP   10/20/2014

## 2014-10-20 NOTE — Patient Instructions (Signed)
Michele Walters , Thank you for taking time to come for your Medicare Wellness Visit. I appreciate your ongoing commitment to your health goals. Please review the following plan we discussed and let me know if I can assist you in the future.   Stop atenolol Start amlodipine 48m - take 1/2 tablet once a day Continue to walk daily  Increase non-starchy vegetables - carrots, green bean, squash, zucchini, tomatoes, onions, peppers, spinach and other green leafy vegetables, cabbage, lettuce, cucumbers, asparagus, okra (not fried), eggplant limit sugar and processed foods (cakes, cookies, ice cream, crackers and chips) Increase fresh fruit but limit serving sizes 1/2 cup or about the size of tennis or baseball limit red meat to no more than 1-2 times per week (serving size about the size of your palm) Choose whole grains / lean proteins - whole wheat bread, quinoa, whole grain rice (1/2 cup), fish, chicken, [place]  This is a list of the screening recommended for you and due dates:  Health Maintenance  Topic Date Due  . Flu Shot  09/06/2014 - will administer at [facility]  . Tetanus Vaccine  01/28/2015 - checked price today $61.54  . Eye exam for diabetics  12/24/2014  . Urine Protein Check  Done today  . Hemoglobin A1C  11/28/2014  . Complete foot exam   07/29/2015  . Pneumonia vaccines (2 of 2 - PPSV23) 07/29/2015  . Colon Cancer Screening  04/28/2018  . DEXA scan (bone density measurement)  completed  . Shingles Vaccine  Completed  *Topic was postponed. The date shown is not the original due date.    Health Maintenance Adopting a healthy lifestyle and getting preventive care can go a long way to promote health and wellness. Talk with your health care provider about what schedule of regular examinations is right for you. This is a good chance for you to check in with your provider about disease prevention and staying healthy. In between checkups, there are plenty of things you can do on  your own. Experts have done a lot of research about which lifestyle changes and preventive measures are most likely to keep you healthy. Ask your health care provider for more information. WEIGHT AND DIET  Eat a healthy diet  Be sure to include plenty of vegetables, fruits, low-fat dairy products, and lean protein.  Do not eat a lot of foods high in solid fats, added sugars, or salt.  Get regular exercise. This is one of the most important things you can do for your health.  Most adults should exercise for at least 150 minutes each week. The exercise should increase your heart rate and make you sweat (moderate-intensity exercise).  Most adults should also do strengthening exercises at least twice a week. This is in addition to the moderate-intensity exercise.  Maintain a healthy weight  Body mass index (BMI) is a measurement that can be used to identify possible weight problems. It estimates body fat based on height and weight. Your health care provider can help determine your BMI and help you achieve or maintain a healthy weight.  For females 225years of age and older:   A BMI below 18.5 is considered underweight.  A BMI of 18.5 to 24.9 is normal.  A BMI of 25 to 29.9 is considered overweight.  A BMI of 30 and above is considered obese.  Watch levels of cholesterol and blood lipids  You should start having your blood tested for lipids and cholesterol at 77 years of  age, then have this test every 5 years.  You may need to have your cholesterol levels checked more often if:  Your lipid or cholesterol levels are high.  You are older than 78 years of age.  You are at high risk for heart disease.  CANCER SCREENING   Lung Cancer  Lung cancer screening is recommended for adults 53-64 years old who are at high risk for lung cancer because of a history of smoking.  A yearly low-dose CT scan of the lungs is recommended for people who:  Currently smoke.  Have quit within the  past 15 years.  Have at least a 30-pack-year history of smoking. A pack year is smoking an average of one pack of cigarettes a day for 1 year.  Yearly screening should continue until it has been 15 years since you quit.  Yearly screening should stop if you develop a health problem that would prevent you from having lung cancer treatment.  Breast Cancer  Practice breast self-awareness. This means understanding how your breasts normally appear and feel.  It also means doing regular breast self-exams. Let your health care provider know about any changes, no matter how small.  If you are in your 20s or 30s, you should have a clinical breast exam (CBE) by a health care provider every 1-3 years as part of a regular health exam.  If you are 58 or older, have a CBE every year. Also consider having a breast X-ray (mammogram) every year.  If you have a family history of breast cancer, talk to your health care provider about genetic screening.  If you are at high risk for breast cancer, talk to your health care provider about having an MRI and a mammogram every year.  Breast cancer gene (BRCA) assessment is recommended for women who have family members with BRCA-related cancers. BRCA-related cancers include:  Breast.  Ovarian.  Tubal.  Peritoneal cancers.  Results of the assessment will determine the need for genetic counseling and BRCA1 and BRCA2 testing. Cervical Cancer Routine pelvic examinations to screen for cervical cancer are no longer recommended for nonpregnant women who are considered low risk for cancer of the pelvic organs (ovaries, uterus, and vagina) and who do not have symptoms. A pelvic examination may be necessary if you have symptoms including those associated with pelvic infections. Ask your health care provider if a screening pelvic exam is right for you.   The Pap test is the screening test for cervical cancer for women who are considered at risk.  If you had a  hysterectomy for a problem that was not cancer or a condition that could lead to cancer, then you no longer need Pap tests.  If you are older than 65 years, and you have had normal Pap tests for the past 10 years, you no longer need to have Pap tests.  If you have had past treatment for cervical cancer or a condition that could lead to cancer, you need Pap tests and screening for cancer for at least 20 years after your treatment.  If you no longer get a Pap test, assess your risk factors if they change (such as having a new sexual partner). This can affect whether you should start being screened again.  Some women have medical problems that increase their chance of getting cervical cancer. If this is the case for you, your health care provider may recommend more frequent screening and Pap tests.  The human papillomavirus (HPV) test is another test that  may be used for cervical cancer screening. The HPV test looks for the virus that can cause cell changes in the cervix. The cells collected during the Pap test can be tested for HPV.  The HPV test can be used to screen women 74 years of age and older. Getting tested for HPV can extend the interval between normal Pap tests from three to five years.  An HPV test also should be used to screen women of any age who have unclear Pap test results.  After 77 years of age, women should have HPV testing as often as Pap tests.  Colorectal Cancer  This type of cancer can be detected and often prevented.  Routine colorectal cancer screening usually begins at 77 years of age and continues through 77 years of age.  Your health care provider may recommend screening at an earlier age if you have risk factors for colon cancer.  Your health care provider may also recommend using home test kits to check for hidden blood in the stool.  A small camera at the end of a tube can be used to examine your colon directly (sigmoidoscopy or colonoscopy). This is done to  check for the earliest forms of colorectal cancer.  Routine screening usually begins at age 2.  Direct examination of the colon should be repeated every 5-10 years through 77 years of age. However, you may need to be screened more often if early forms of precancerous polyps or small growths are found. Skin Cancer  Check your skin from head to toe regularly.  Tell your health care provider about any new moles or changes in moles, especially if there is a change in a mole's shape or color.  Also tell your health care provider if you have a mole that is larger than the size of a pencil eraser.  Always use sunscreen. Apply sunscreen liberally and repeatedly throughout the day.  Protect yourself by wearing long sleeves, pants, a wide-brimmed hat, and sunglasses whenever you are outside. HEART DISEASE, DIABETES, AND HIGH BLOOD PRESSURE   Have your blood pressure checked at least every 1-2 years. High blood pressure causes heart disease and increases the risk of stroke.  If you are between 69 years and 35 years old, ask your health care provider if you should take aspirin to prevent strokes.  Have regular diabetes screenings. This involves taking a blood sample to check your fasting blood sugar level.  If you are at a normal weight and have a low risk for diabetes, have this test once every three years after 77 years of age.  If you are overweight and have a high risk for diabetes, consider being tested at a younger age or more often. PREVENTING INFECTION  Hepatitis B  If you have a higher risk for hepatitis B, you should be screened for this virus. You are considered at high risk for hepatitis B if:  You were born in a country where hepatitis B is common. Ask your health care provider which countries are considered high risk.  Your parents were born in a high-risk country, and you have not been immunized against hepatitis B (hepatitis B vaccine).  You have HIV or AIDS.  You use  needles to inject street drugs.  You live with someone who has hepatitis B.  You have had sex with someone who has hepatitis B.  You get hemodialysis treatment.  You take certain medicines for conditions, including cancer, organ transplantation, and autoimmune conditions. Hepatitis C  Blood testing  is recommended for:  Everyone born from [birth year] through 1963.  Anyone with known risk factors for hepatitis C. Sexually transmitted infections (STIs)  You should be screened for sexually transmitted infections (STIs) including gonorrhea and chlamydia if:  You are sexually active and are younger than 77 years of age.  You are older than 77 years of age and your health care provider tells you that you are at risk for this type of infection.  Your sexual activity has changed since you were last screened and you are at an increased risk for chlamydia or gonorrhea. Ask your health care provider if you are at risk.  If you do not have HIV, but are at risk, it may be recommended that you take a prescription medicine daily to prevent HIV infection. This is called pre-exposure prophylaxis (PrEP). You are considered at risk if:  You are sexually active and do not regularly use condoms or know the HIV status of your partner(s).  You take drugs by injection.  You are sexually active with a partner who has HIV. Talk with your health care provider about whether you are at high risk of being infected with HIV. If you choose to begin PrEP, you should first be tested for HIV. You should then be tested every 3 months for as long as you are taking PrEP.  PREGNANCY   If you are premenopausal and you may become pregnant, ask your health care provider about preconception counseling.  If you may become pregnant, take 400 to 800 micrograms (mcg) of folic acid every day.  If you want to prevent pregnancy, talk to your health care provider about birth control (contraception). OSTEOPOROSIS AND MENOPAUSE    Osteoporosis is a disease in which the bones lose minerals and strength with aging. This can result in serious bone fractures. Your risk for osteoporosis can be identified using a bone density scan.  If you are 9 years of age or older, or if you are at risk for osteoporosis and fractures, ask your health care provider if you should be screened.  Ask your health care provider whether you should take a calcium or vitamin D supplement to lower your risk for osteoporosis.  Menopause may have certain physical symptoms and risks.  Hormone replacement therapy may reduce some of these symptoms and risks. Talk to your health care provider about whether hormone replacement therapy is right for you.  HOME CARE INSTRUCTIONS   Schedule regular health, dental, and eye exams.  Stay current with your immunizations.   Do not use any tobacco products including cigarettes, chewing tobacco, or electronic cigarettes.  If you are pregnant, do not drink alcohol.  If you are breastfeeding, limit how much and how often you drink alcohol.  Limit alcohol intake to no more than 1 drink per day for nonpregnant women. One drink equals 12 ounces of beer, 5 ounces of wine, or 1 ounces of hard liquor.  Do not use street drugs.  Do not share needles.  Ask your health care provider for help if you need support or information about quitting drugs.  Tell your health care provider if you often feel depressed.  Tell your health care provider if you have ever been abused or do not feel safe at home. Document Released: 08/07/2010 Document Revised: 06/08/2013 Document Reviewed: 12/24/2012 [facility] Patient Information 2015 [place], [place]. This information is not intended to replace advice given to you by your health care provider. Make sure you discuss any questions you have with your  health care provider.

## 2014-10-20 NOTE — Patient Instructions (Signed)
[name] , Thank you for taking time to come for your Medicare Wellness Visit. I appreciate your ongoing commitment to your health goals. Please review the following plan we discussed and let me know if I can assist you in the future.   Stop atenolol Start amlodipine 5mg - take 1/2 tablet once a day Continue to walk daily  Increase non-starchy vegetables - carrots, green bean, squash, zucchini, tomatoes, onions, peppers, spinach and other green leafy vegetables, cabbage, lettuce, cucumbers, asparagus, okra (not fried), eggplant limit sugar and processed foods (cakes, cookies, ice cream, crackers and chips) Increase fresh fruit but limit serving sizes 1/2 cup or about the size of tennis or baseball limit red meat to no more than 1-2 times per week (serving size about the size of your palm) Choose whole grains / lean proteins - whole wheat bread, quinoa, whole grain rice (1/2 cup), fish, chicken, turkey   This is a list of the screening recommended for you and due dates:  Health Maintenance  Topic Date Due  . Flu Shot  09/06/2014 - will administer at [facility]  . Tetanus Vaccine  01/28/2015 - checked price today $61.54  . Eye exam for diabetics  12/24/2014  . Urine Protein Check  Done today  . Hemoglobin A1C  11/28/2014  . Complete foot exam   07/29/2015  . Pneumonia vaccines (2 of 2 - PPSV23) 07/29/2015  . Colon Cancer Screening  04/28/2018  . DEXA scan (bone density measurement)  completed  . Shingles Vaccine  Completed  *Topic was postponed. The date shown is not the original due date.    Health Maintenance Adopting a healthy lifestyle and getting preventive care can go a long way to promote health and wellness. Talk with your health care provider about what schedule of regular examinations is right for you. This is a good chance for you to check in with your provider about disease prevention and staying healthy. In between checkups, there are plenty of things you can do on  your own. Experts have done a lot of research about which lifestyle changes and preventive measures are most likely to keep you healthy. Ask your health care provider for more information. WEIGHT AND DIET  Eat a healthy diet  Be sure to include plenty of vegetables, fruits, low-fat dairy products, and lean protein.  Do not eat a lot of foods high in solid fats, added sugars, or salt.  Get regular exercise. This is one of the most important things you can do for your health.  Most adults should exercise for at least 150 minutes each week. The exercise should increase your heart rate and make you sweat (moderate-intensity exercise).  Most adults should also do strengthening exercises at least twice a week. This is in addition to the moderate-intensity exercise.  Maintain a healthy weight  Body mass index (BMI) is a measurement that can be used to identify possible weight problems. It estimates body fat based on height and weight. Your health care provider can help determine your BMI and help you achieve or maintain a healthy weight.  For females 20 years of age and older:   A BMI below 18.5 is considered underweight.  A BMI of 18.5 to 24.9 is normal.  A BMI of 25 to 29.9 is considered overweight.  A BMI of 30 and above is considered obese.  Watch levels of cholesterol and blood lipids  You should start having your blood tested for lipids and cholesterol at 77 years of   age, then have this test every 5 years.  You may need to have your cholesterol levels checked more often if:  Your lipid or cholesterol levels are high.  You are older than 77 years of age.  You are at high risk for heart disease.  CANCER SCREENING   Lung Cancer  Lung cancer screening is recommended for adults 55-80 years old who are at high risk for lung cancer because of a history of smoking.  A yearly low-dose CT scan of the lungs is recommended for people who:  Currently smoke.  Have quit within the  past 15 years.  Have at least a 30-pack-year history of smoking. A pack year is smoking an average of one pack of cigarettes a day for 1 year.  Yearly screening should continue until it has been 15 years since you quit.  Yearly screening should stop if you develop a health problem that would prevent you from having lung cancer treatment.  Breast Cancer  Practice breast self-awareness. This means understanding how your breasts normally appear and feel.  It also means doing regular breast self-exams. Let your health care provider know about any changes, no matter how small.  If you are in your 20s or 30s, you should have a clinical breast exam (CBE) by a health care provider every 1-3 years as part of a regular health exam.  If you are 40 or older, have a CBE every year. Also consider having a breast X-ray (mammogram) every year.  If you have a family history of breast cancer, talk to your health care provider about genetic screening.  If you are at high risk for breast cancer, talk to your health care provider about having an MRI and a mammogram every year.  Breast cancer gene (BRCA) assessment is recommended for women who have family members with BRCA-related cancers. BRCA-related cancers include:  Breast.  Ovarian.  Tubal.  Peritoneal cancers.  Results of the assessment will determine the need for genetic counseling and BRCA1 and BRCA2 testing. Cervical Cancer Routine pelvic examinations to screen for cervical cancer are no longer recommended for nonpregnant women who are considered low risk for cancer of the pelvic organs (ovaries, uterus, and vagina) and who do not have symptoms. A pelvic examination may be necessary if you have symptoms including those associated with pelvic infections. Ask your health care provider if a screening pelvic exam is right for you.   The Pap test is the screening test for cervical cancer for women who are considered at risk.  If you had a  hysterectomy for a problem that was not cancer or a condition that could lead to cancer, then you no longer need Pap tests.  If you are older than 65 years, and you have had normal Pap tests for the past 10 years, you no longer need to have Pap tests.  If you have had past treatment for cervical cancer or a condition that could lead to cancer, you need Pap tests and screening for cancer for at least 20 years after your treatment.  If you no longer get a Pap test, assess your risk factors if they change (such as having a new sexual partner). This can affect whether you should start being screened again.  Some women have medical problems that increase their chance of getting cervical cancer. If this is the case for you, your health care provider may recommend more frequent screening and Pap tests.  The human papillomavirus (HPV) test is another test that   may be used for cervical cancer screening. The HPV test looks for the virus that can cause cell changes in the cervix. The cells collected during the Pap test can be tested for HPV.  The HPV test can be used to screen women 30 years of age and older. Getting tested for HPV can extend the interval between normal Pap tests from three to five years.  An HPV test also should be used to screen women of any age who have unclear Pap test results.  After 77 years of age, women should have HPV testing as often as Pap tests.  Colorectal Cancer  This type of cancer can be detected and often prevented.  Routine colorectal cancer screening usually begins at 77 years of age and continues through 77 years of age.  Your health care provider may recommend screening at an earlier age if you have risk factors for colon cancer.  Your health care provider may also recommend using home test kits to check for hidden blood in the stool.  A small camera at the end of a tube can be used to examine your colon directly (sigmoidoscopy or colonoscopy). This is done to  check for the earliest forms of colorectal cancer.  Routine screening usually begins at age 50.  Direct examination of the colon should be repeated every 5-10 years through 77 years of age. However, you may need to be screened more often if early forms of precancerous polyps or small growths are found. Skin Cancer  Check your skin from head to toe regularly.  Tell your health care provider about any new moles or changes in moles, especially if there is a change in a mole's shape or color.  Also tell your health care provider if you have a mole that is larger than the size of a pencil eraser.  Always use sunscreen. Apply sunscreen liberally and repeatedly throughout the day.  Protect yourself by wearing long sleeves, pants, a wide-brimmed hat, and sunglasses whenever you are outside. HEART DISEASE, DIABETES, AND HIGH BLOOD PRESSURE   Have your blood pressure checked at least every 1-2 years. High blood pressure causes heart disease and increases the risk of stroke.  If you are between 55 years and 79 years old, ask your health care provider if you should take aspirin to prevent strokes.  Have regular diabetes screenings. This involves taking a blood sample to check your fasting blood sugar level.  If you are at a normal weight and have a low risk for diabetes, have this test once every three years after 77 years of age.  If you are overweight and have a high risk for diabetes, consider being tested at a younger age or more often. PREVENTING INFECTION  Hepatitis B  If you have a higher risk for hepatitis B, you should be screened for this virus. You are considered at high risk for hepatitis B if:  You were born in a country where hepatitis B is common. Ask your health care provider which countries are considered high risk.  Your parents were born in a high-risk country, and you have not been immunized against hepatitis B (hepatitis B vaccine).  You have HIV or AIDS.  You use  needles to inject street drugs.  You live with someone who has hepatitis B.  You have had sex with someone who has hepatitis B.  You get hemodialysis treatment.  You take certain medicines for conditions, including cancer, organ transplantation, and autoimmune conditions. Hepatitis C  Blood testing   is recommended for:  Everyone born from 1945 through 1965.  Anyone with known risk factors for hepatitis C. Sexually transmitted infections (STIs)  You should be screened for sexually transmitted infections (STIs) including gonorrhea and chlamydia if:  You are sexually active and are younger than 77 years of age.  You are older than 77 years of age and your health care provider tells you that you are at risk for this type of infection.  Your sexual activity has changed since you were last screened and you are at an increased risk for chlamydia or gonorrhea. Ask your health care provider if you are at risk.  If you do not have HIV, but are at risk, it may be recommended that you take a prescription medicine daily to prevent HIV infection. This is called pre-exposure prophylaxis (PrEP). You are considered at risk if:  You are sexually active and do not regularly use condoms or know the HIV status of your partner(s).  You take drugs by injection.  You are sexually active with a partner who has HIV. Talk with your health care provider about whether you are at high risk of being infected with HIV. If you choose to begin PrEP, you should first be tested for HIV. You should then be tested every 3 months for as long as you are taking PrEP.  PREGNANCY   If you are premenopausal and you may become pregnant, ask your health care provider about preconception counseling.  If you may become pregnant, take 400 to 800 micrograms (mcg) of folic acid every day.  If you want to prevent pregnancy, talk to your health care provider about birth control (contraception). OSTEOPOROSIS AND MENOPAUSE    Osteoporosis is a disease in which the bones lose minerals and strength with aging. This can result in serious bone fractures. Your risk for osteoporosis can be identified using a bone density scan.  If you are 65 years of age or older, or if you are at risk for osteoporosis and fractures, ask your health care provider if you should be screened.  Ask your health care provider whether you should take a calcium or vitamin D supplement to lower your risk for osteoporosis.  Menopause may have certain physical symptoms and risks.  Hormone replacement therapy may reduce some of these symptoms and risks. Talk to your health care provider about whether hormone replacement therapy is right for you.  HOME CARE INSTRUCTIONS   Schedule regular health, dental, and eye exams.  Stay current with your immunizations.   Do not use any tobacco products including cigarettes, chewing tobacco, or electronic cigarettes.  If you are pregnant, do not drink alcohol.  If you are breastfeeding, limit how much and how often you drink alcohol.  Limit alcohol intake to no more than 1 drink per day for nonpregnant women. One drink equals 12 ounces of beer, 5 ounces of wine, or 1 ounces of hard liquor.  Do not use street drugs.  Do not share needles.  Ask your health care provider for help if you need support or information about quitting drugs.  Tell your health care provider if you often feel depressed.  Tell your health care provider if you have ever been abused or do not feel safe at home. Document Released: 08/07/2010 Document Revised: 06/08/2013 Document Reviewed: 12/24/2012 [employer] Patient Information 2015 [employer]. This information is not intended to replace advice given to you by your health care provider. Make sure you discuss any questions you have with your   health care provider.

## 2014-10-21 LAB — THYROID PANEL WITH TSH
FREE THYROXINE INDEX: 3.1 (ref 1.2–4.9)
T3 Uptake Ratio: 26 % (ref 24–39)
T4, Total: 11.8 ug/dL (ref 4.5–12.0)
TSH: 0.793 u[IU]/mL (ref 0.450–4.500)

## 2014-10-21 LAB — MICROALBUMIN / CREATININE URINE RATIO
CREATININE, UR: 145.1 mg/dL
MICROALB/CREAT RATIO: 26.5 mg/g{creat} (ref 0.0–30.0)
Microalbumin, Urine: 38.4 ug/mL

## 2014-10-30 ENCOUNTER — Other Ambulatory Visit: Payer: Self-pay | Admitting: Nurse Practitioner

## 2014-11-23 ENCOUNTER — Encounter: Payer: Self-pay | Admitting: Nurse Practitioner

## 2014-11-23 ENCOUNTER — Ambulatory Visit (INDEPENDENT_AMBULATORY_CARE_PROVIDER_SITE_OTHER): Payer: Medicare Other | Admitting: Nurse Practitioner

## 2014-11-23 ENCOUNTER — Ambulatory Visit (INDEPENDENT_AMBULATORY_CARE_PROVIDER_SITE_OTHER): Payer: Medicare Other

## 2014-11-23 VITALS — BP 165/87 | HR 83 | Temp 96.9°F | Ht 61.0 in | Wt 144.0 lb

## 2014-11-23 DIAGNOSIS — I10 Essential (primary) hypertension: Secondary | ICD-10-CM | POA: Diagnosis not present

## 2014-11-23 DIAGNOSIS — E038 Other specified hypothyroidism: Secondary | ICD-10-CM | POA: Diagnosis not present

## 2014-11-23 DIAGNOSIS — E119 Type 2 diabetes mellitus without complications: Secondary | ICD-10-CM

## 2014-11-23 DIAGNOSIS — F17200 Nicotine dependence, unspecified, uncomplicated: Secondary | ICD-10-CM | POA: Insufficient documentation

## 2014-11-23 DIAGNOSIS — Z6827 Body mass index (BMI) 27.0-27.9, adult: Secondary | ICD-10-CM | POA: Diagnosis not present

## 2014-11-23 DIAGNOSIS — Z720 Tobacco use: Secondary | ICD-10-CM | POA: Diagnosis not present

## 2014-11-23 DIAGNOSIS — E034 Atrophy of thyroid (acquired): Secondary | ICD-10-CM | POA: Diagnosis not present

## 2014-11-23 DIAGNOSIS — E785 Hyperlipidemia, unspecified: Secondary | ICD-10-CM

## 2014-11-23 LAB — POCT GLYCOSYLATED HEMOGLOBIN (HGB A1C): HEMOGLOBIN A1C: 5.7

## 2014-11-23 NOTE — Patient Instructions (Signed)
Bone Health Bones protect organs, store calcium, and anchor muscles. Good health habits, such as eating nutritious foods and exercising regularly, are important for maintaining healthy bones. They can also help to prevent a condition that causes bones to lose density and become weak and brittle (osteoporosis). WHY IS BONE MASS IMPORTANT? Bone mass refers to the amount of bone tissue that you have. The higher your bone mass, the stronger your bones. An important step toward having healthy bones throughout life is to have strong and dense bones during childhood. A young adult who has a high bone mass is more likely to have a high bone mass later in life. Bone mass at its greatest it is called peak bone mass. A large decline in bone mass occurs in older adults. In women, it occurs about the time of menopause. During this time, it is important to practice good health habits, because if more bone is lost than what is replaced, the bones will become less healthy and more likely to break (fracture). If you find that you have a low bone mass, you may be able to prevent osteoporosis or further bone loss by changing your diet and lifestyle. HOW CAN I FIND OUT IF MY BONE MASS IS LOW? Bone mass can be measured with an X-ray test that is called a bone mineral density (BMD) test. This test is recommended for all women who are age 65 or older. It may also be recommended for men who are age 70 or older, or for people who are more likely to develop osteoporosis due to:  Having bones that break easily.  Having a long-term disease that weakens bones, such as kidney disease or rheumatoid arthritis.  Having menopause earlier than normal.  Taking medicine that weakens bones, such as steroids, thyroid hormones, or hormone treatment for breast cancer or prostate cancer.  Smoking.  Drinking three or more alcoholic drinks each day. WHAT ARE THE NUTRITIONAL RECOMMENDATIONS FOR HEALTHY BONES? To have healthy bones, you need  to get enough of the right minerals and vitamins. Most nutrition experts recommend getting these nutrients from the foods that you eat. Nutritional recommendations vary from person to person. Ask your health care provider what is healthy for you. Here are some general guidelines. Calcium Recommendations Calcium is the most important (essential) mineral for bone health. Most people can get enough calcium from their diet, but supplements may be recommended for people who are at risk for osteoporosis. Good sources of calcium include:  Dairy products, such as low-fat or nonfat milk, cheese, and yogurt.  Dark green leafy vegetables, such as bok choy and broccoli.  Calcium-fortified foods, such as orange juice, cereal, bread, soy beverages, and tofu products.  Nuts, such as almonds. Follow these recommended amounts for daily calcium intake:  Children, age 1-3: 700 mg.  Children, age 4-8: 1,000 mg.  Children, age 9-13: 1,300 mg.  Teens, age 14-18: 1,300 mg.  Adults, age 19-50: 1,000 mg.  Adults, age 51-70:  Men: 1,000 mg.  Women: 1,200 mg.  Adults, age 71 or older: 1,200 mg.  Pregnant and breastfeeding females:  Teens: 1,300 mg.  Adults: 1,000 mg. Vitamin D Recommendations Vitamin D is the most essential vitamin for bone health. It helps the body to absorb calcium. Sunlight stimulates the skin to make vitamin D, so be sure to get enough sunlight. If you live in a cold climate or you do not get outside often, your health care provider may recommend that you take vitamin D supplements. Good   sources of vitamin D in your diet include:  Egg yolks.  Saltwater fish.  Milk and cereal fortified with vitamin D. Follow these recommended amounts for daily vitamin D intake:  Children and teens, age 1-18: 600 international units.  Adults, age 50 or younger: 400-800 international units.  Adults, age 51 or older: 800-1,000 international units. Other Nutrients Other nutrients for bone  health include:  Phosphorus. This mineral is found in meat, poultry, dairy foods, nuts, and legumes. The recommended daily intake for adult men and adult women is 700 mg.  Magnesium. This mineral is found in seeds, nuts, dark green vegetables, and legumes. The recommended daily intake for adult men is 400-420 mg. For adult women, it is 310-320 mg.  Vitamin K. This vitamin is found in green leafy vegetables. The recommended daily intake is 120 mg for adult men and 90 mg for adult women. WHAT TYPE OF PHYSICAL ACTIVITY IS BEST FOR BUILDING AND MAINTAINING HEALTHY BONES? Weight-bearing and strength-building activities are important for building and maintaining peak bone mass. Weight-bearing activities cause muscles and bones to work against gravity. Strength-building activities increases muscle strength that supports bones. Weight-bearing and muscle-building activities include:  Walking and hiking.  Jogging and running.  Dancing.  Gym exercises.  Lifting weights.  Tennis and racquetball.  Climbing stairs.  Aerobics. Adults should get at least 30 minutes of moderate physical activity on most days. Children should get at least 60 minutes of moderate physical activity on most days. Ask your health care provide what type of exercise is best for you. WHERE CAN I FIND MORE INFORMATION? For more information, check out the following websites:  National Osteoporosis Foundation: http://nof.org/learn/basics  National Institutes of Health: http://www.niams.nih.gov/Health_Info/Bone/Bone_Health/bone_health_for_life.asp   This information is not intended to replace advice given to you by your health care provider. Make sure you discuss any questions you have with your health care provider.   Document Released: 04/14/2003 Document Revised: 06/08/2014 Document Reviewed: 01/27/2014 [employer] Interactive Patient Education 2016 [employer]  

## 2014-11-23 NOTE — Progress Notes (Signed)
Subjective:    Patient ID: Michele Walters, female    DOB: 1935-11-04, 79 y.o.   MRN: [id number]   HPI: Patient here today for follow up pf multiple medical problems, States she is doing well. No complaints  Hyperlipidemia This is a chronic problem. The current episode started more than 1 year ago. The problem is controlled. Exacerbating diseases include diabetes. She has no history of hypothyroidism. Pertinent negatives include no chest pain or shortness of breath. Current antihyperlipidemic treatment includes statins. The current treatment provides moderate improvement of lipids. Compliance problems include adherence to diet and adherence to exercise.  Risk factors for coronary artery disease include diabetes mellitus, dyslipidemia, hypertension and obesity.  Thyroid Problem Visit type: hypothyroidism. Patient reports no constipation, diaphoresis, diarrhea, heat intolerance, menstrual problem or palpitations. The symptoms have been stable. Her past medical history is significant for diabetes and hyperlipidemia.  Hypertension This is a chronic problem. The current episode started more than 1 year ago. The problem is controlled. Pertinent negatives include no chest pain, headaches, neck pain, palpitations or shortness of breath. Risk factors for coronary artery disease include dyslipidemia, diabetes mellitus, obesity and post-menopausal state. Past treatments include beta blockers. The current treatment provides moderate improvement. Compliance problems include diet and exercise.  Hypertensive end-organ damage includes a thyroid problem.  Diabetes She presents for her follow-up diabetic visit. She has type 2 diabetes mellitus. No MedicAlert identification noted. There are no hypoglycemic associated symptoms. Pertinent negatives for hypoglycemia include no headaches. Pertinent negatives for diabetes include no chest pain. There are no hypoglycemic complications. Symptoms are stable. Risk factors for  coronary artery disease include tobacco exposure, hypertension and dyslipidemia. Her weight is stable. When asked about meal planning, she reported none. She has not had a previous visit with a dietitian. She rarely participates in exercise. Her breakfast blood glucose is taken between 8-9 am. Her breakfast blood glucose range is generally 110-130 mg/dl. Her highest blood glucose is >200 mg/dl. Her overall blood glucose range is 130-140 mg/dl. An ACE inhibitor/angiotensin II receptor blocker is not being taken. She does not see a podiatrist.Eye exam is not current.     Review of Systems  Constitutional: Negative for diaphoresis.  Respiratory: Negative for shortness of breath.   Cardiovascular: Negative for chest pain and palpitations.  Gastrointestinal: Negative for diarrhea and constipation.  Endocrine: Negative for heat intolerance.  Genitourinary: Negative for menstrual problem.  Musculoskeletal: Negative for neck pain.  Neurological: Negative for headaches.       Objective:   Physical Exam  Constitutional: She is oriented to person, place, and time. She appears well-developed and well-nourished.  HENT:  Nose: Nose normal.  Mouth/Throat: Oropharynx is clear and moist.  Eyes: EOM are normal.  Neck: Trachea normal, normal range of motion and full passive range of motion without pain. Neck supple. No JVD present. Carotid bruit is not present. No thyromegaly present.  Cardiovascular: Normal rate, regular rhythm, normal heart sounds and intact distal pulses.  Exam reveals no gallop and no friction rub.   No murmur heard. Pulmonary/Chest: Effort normal and breath sounds normal.  Abdominal: Soft. Bowel sounds are normal. She exhibits no distension and no mass. There is no tenderness.  Musculoskeletal: Normal range of motion.  Lymphadenopathy:    She has no cervical adenopathy.  Neurological: She is alert and oriented to person, place, and time. She has normal reflexes.  Skin: Skin is warm  and dry.  Psychiatric: She has a normal mood and affect. Her behavior is  normal. Judgment and thought content normal.    BP 165/87 mmHg  Pulse 83  Temp(Src) 96.9 F (36.1 C) (Oral)  Ht 5' 1"  (1.549 m)  Wt 144 lb (65.318 kg)  BMI 27.22 kg/m2   EKG-NSR-Michele Josepha Barbier, FNP   Chest x ray-chronic bronchitic changes-Preliminary reading by Ronnald Collum, FNP  [facility]     Results for orders placed or performed in visit on 11/23/14  POCT glycosylated hemoglobin (Hb A1C)  Result Value Ref Range   Hemoglobin A1C 5.7     Assessment & Plan:  1. Type 2 diabetes mellitus without complication, without long-term current use of insulin (HCC) Continue to watch carbs in diet - POCT glycosylated hemoglobin (Hb A1C)  2. Hyperlipidemia Low fat diet - Lipid panel  3. Essential hypertension Do not add salt to diet  Increase norvasc to 80m daily - CMP14+EGFR - EKG 12-Lead  4. Hypothyroidism due to acquired atrophy of thyroid  5. Smoker Cessation encourgaed - DG Chest 2 View; Future  6. BMI 27 Discussed diet and exercise for person with BMI >25 Will recheck weight in 3-6 months    Continue all meds Labs pending Health Maintenance reviewed Diet and exercise encouraged RTO 3 months  Michele MHassell Done FNP

## 2014-11-24 LAB — CMP14+EGFR
ALT: 15 IU/L (ref 0–32)
AST: 23 IU/L (ref 0–40)
Albumin/Globulin Ratio: 1.3 (ref 1.1–2.5)
Albumin: 4.1 g/dL (ref 3.5–4.8)
Alkaline Phosphatase: 94 IU/L (ref 39–117)
BUN/Creatinine Ratio: 16 (ref 11–26)
BUN: 17 mg/dL (ref 8–27)
Bilirubin Total: 0.6 mg/dL (ref 0.0–1.2)
CALCIUM: 9.5 mg/dL (ref 8.7–10.3)
CO2: 24 mmol/L (ref 18–29)
CREATININE: 1.08 mg/dL — AB (ref 0.57–1.00)
Chloride: 100 mmol/L (ref 97–106)
GFR calc Af Amer: 57 mL/min/{1.73_m2} — ABNORMAL LOW (ref >59)
GFR, EST NON AFRICAN AMERICAN: 50 mL/min/{1.73_m2} — AB (ref >59)
GLOBULIN, TOTAL: 3.1 g/dL (ref 1.5–4.5)
GLUCOSE: 125 mg/dL — AB (ref 65–99)
Potassium: 4.7 mmol/L (ref 3.5–5.2)
SODIUM: 140 mmol/L (ref 136–144)
Total Protein: 7.2 g/dL (ref 6.0–8.5)

## 2014-11-24 LAB — LIPID PANEL
CHOL/HDL RATIO: 2.6 ratio (ref 0.0–4.4)
Cholesterol, Total: 134 mg/dL (ref 100–199)
HDL: 51 mg/dL (ref >39)
LDL CALC: 61 mg/dL (ref 0–99)
TRIGLYCERIDES: 110 mg/dL (ref 0–149)
VLDL CHOLESTEROL CAL: 22 mg/dL (ref 5–40)

## 2015-01-02 ENCOUNTER — Other Ambulatory Visit: Payer: Self-pay | Admitting: Nurse Practitioner

## 2015-01-29 ENCOUNTER — Other Ambulatory Visit: Payer: Self-pay | Admitting: Nurse Practitioner

## 2015-02-28 ENCOUNTER — Ambulatory Visit: Payer: Medicare Other | Admitting: Nurse Practitioner

## 2015-03-08 ENCOUNTER — Encounter: Payer: Self-pay | Admitting: Nurse Practitioner

## 2015-03-08 ENCOUNTER — Ambulatory Visit (INDEPENDENT_AMBULATORY_CARE_PROVIDER_SITE_OTHER): Payer: Medicare Other | Admitting: Nurse Practitioner

## 2015-03-08 VITALS — BP 146/86 | HR 76 | Temp 96.8°F | Ht 61.0 in | Wt 143.0 lb

## 2015-03-08 DIAGNOSIS — E038 Other specified hypothyroidism: Secondary | ICD-10-CM

## 2015-03-08 DIAGNOSIS — I10 Essential (primary) hypertension: Secondary | ICD-10-CM

## 2015-03-08 DIAGNOSIS — Z720 Tobacco use: Secondary | ICD-10-CM

## 2015-03-08 DIAGNOSIS — E785 Hyperlipidemia, unspecified: Secondary | ICD-10-CM

## 2015-03-08 DIAGNOSIS — F17200 Nicotine dependence, unspecified, uncomplicated: Secondary | ICD-10-CM

## 2015-03-08 DIAGNOSIS — E034 Atrophy of thyroid (acquired): Secondary | ICD-10-CM

## 2015-03-08 DIAGNOSIS — N183 Chronic kidney disease, stage 3 unspecified: Secondary | ICD-10-CM

## 2015-03-08 DIAGNOSIS — E119 Type 2 diabetes mellitus without complications: Secondary | ICD-10-CM

## 2015-03-08 DIAGNOSIS — Z6827 Body mass index (BMI) 27.0-27.9, adult: Secondary | ICD-10-CM

## 2015-03-08 LAB — POCT GLYCOSYLATED HEMOGLOBIN (HGB A1C): Hemoglobin A1C: 6

## 2015-03-08 MED ORDER — ATENOLOL 50 MG PO TABS: ORAL_TABLET | ORAL | Status: DC

## 2015-03-08 MED ORDER — LEVOTHYROXINE SODIUM 88 MCG PO TABS: ORAL_TABLET | ORAL | Status: DC

## 2015-03-08 MED ORDER — METFORMIN HCL ER 500 MG PO TB24: ORAL_TABLET | ORAL | Status: DC

## 2015-03-08 MED ORDER — AMLODIPINE BESYLATE 5 MG PO TABS: 2.5000 mg | ORAL_TABLET | Freq: Every day | ORAL | Status: DC

## 2015-03-08 MED ORDER — ATORVASTATIN CALCIUM 40 MG PO TABS: ORAL_TABLET | ORAL | Status: DC

## 2015-03-08 NOTE — Patient Instructions (Signed)
Diabetes and Foot Care Diabetes may cause you to have problems because of poor blood supply (circulation) to your feet and legs. This may cause the skin on your feet to become thinner, break easier, and heal more slowly. Your skin may become dry, and the skin may peel and crack. You may also have nerve damage in your legs and feet causing decreased feeling in them. You may not notice minor injuries to your feet that could lead to infections or more serious problems. Taking care of your feet is one of the most important things you can do for yourself.  HOME CARE INSTRUCTIONS  Wear shoes at all times, even in the house. Do not go barefoot. Bare feet are easily injured.  Check your feet daily for blisters, cuts, and redness. If you cannot see the bottom of your feet, use a mirror or ask someone for help.  Wash your feet with warm water (do not use hot water) and mild soap. Then pat your feet and the areas between your toes until they are completely dry. Do not soak your feet as this can dry your skin.  Apply a moisturizing lotion or petroleum jelly (that does not contain alcohol and is unscented) to the skin on your feet and to dry, brittle toenails. Do not apply lotion between your toes.  Trim your toenails straight across. Do not dig under them or around the cuticle. File the edges of your nails with an emery board or nail file.  Do not cut corns or calluses or try to remove them with medicine.  Wear clean socks or stockings every day. Make sure they are not too tight. Do not wear knee-high stockings since they may decrease blood flow to your legs.  Wear shoes that fit properly and have enough cushioning. To break in new shoes, wear them for just a few hours a day. This prevents you from injuring your feet. Always look in your shoes before you put them on to be sure there are no objects inside.  Do not cross your legs. This may decrease the blood flow to your feet.  If you find a minor scrape,  cut, or break in the skin on your feet, keep it and the skin around it clean and dry. These areas may be cleansed with mild soap and water. Do not cleanse the area with peroxide, alcohol, or iodine.  When you remove an adhesive bandage, be sure not to damage the skin around it.  If you have a wound, look at it several times a day to make sure it is healing.  Do not use heating pads or hot water bottles. They may burn your skin. If you have lost feeling in your feet or legs, you may not know it is happening until it is too late.  Make sure your health care provider performs a complete foot exam at least annually or more often if you have foot problems. Report any cuts, sores, or bruises to your health care provider immediately. SEEK MEDICAL CARE IF:   You have an injury that is not healing.  You have cuts or breaks in the skin.  You have an ingrown nail.  You notice redness on your legs or feet.  You feel burning or tingling in your legs or feet.  You have pain or cramps in your legs and feet.  Your legs or feet are numb.  Your feet always feel cold. SEEK IMMEDIATE MEDICAL CARE IF:   There is increasing redness,   swelling, or pain in or around a wound.  There is a red line that goes up your leg.  Pus is coming from a wound.  You develop a fever or as directed by your health care provider.  You notice a bad smell coming from an ulcer or wound.   This information is not intended to replace advice given to you by your health care provider. Make sure you discuss any questions you have with your health care provider.   Document Released: 01/20/2000 Document Revised: 09/24/2012 Document Reviewed: 07/01/2012 Elsevier Interactive Patient Education 2016 [employer]  

## 2015-03-08 NOTE — Progress Notes (Signed)
Subjective:    Patient ID: Michele Walters, female    DOB: 12/14/1935, 80 y.o.   MRN: [id number]   HPI:  Patient here today for follow up of chronic medical problems.  Outpatient Encounter Prescriptions as of 03/08/2015  Medication Sig  . amLODipine (NORVASC) 5 MG tablet Take 0.5 tablets (2.5 mg total) by mouth daily.  . aspirin 81 MG tablet Take 81 mg by mouth daily.  . atenolol (TENORMIN) 50 MG tablet TAKE 1 TABLET (50 MG TOTAL) BY MOUTH DAILY.  . atorvastatin (LIPITOR) 40 MG tablet TAKE 1 TABLET (40 MG TOTAL) BY MOUTH DAILY.  . calcium carbonate (OS-CAL) 600 MG TABS tablet Take 600 mg by mouth daily.  . cetirizine (ZYRTEC) 10 MG tablet Take 10 mg by mouth daily as needed.  . [employer] Lancets Ultra Thin MISC Check blood sugar daily and PRN  . levothyroxine (SYNTHROID, LEVOTHROID) 88 MCG tablet TAKE 1 TABLET (88 MCG TOTAL) BY MOUTH DAILY BEFORE BREAKFAST.  . metFORMIN (GLUCOPHAGE-XR) 500 MG 24 hr tablet TAKE 1 TABLET (500 MG TOTAL) BY MOUTH DAILY WITH BREAKFAST.  . Omega-3 Fatty Acids (FISH OIL) 1000 MG CAPS Take 1 capsule by mouth daily.  . ONETOUCH VERIO test strip TEST ONCE A DAY  . [DISCONTINUED] metFORMIN (GLUCOPHAGE-XR) 500 MG 24 hr tablet TAKE 1 TABLET (500 MG TOTAL) BY MOUTH DAILY WITH BREAKFAST.   No facility-administered encounter medications on file as of 03/08/2015.     Hyperlipidemia This is a chronic problem. The current episode started more than 1 year ago. The problem is controlled. Exacerbating diseases include diabetes. She has no history of hypothyroidism. Pertinent negatives include no chest pain or shortness of breath. Current antihyperlipidemic treatment includes statins. The current treatment provides moderate improvement of lipids. Compliance problems include adherence to diet and adherence to exercise.  Risk factors for coronary artery disease include diabetes mellitus, dyslipidemia, hypertension and obesity.  Thyroid Problem Visit type: hypothyroidism. Patient reports  no constipation, diaphoresis, diarrhea, heat intolerance, menstrual problem or palpitations. The symptoms have been stable. Her past medical history is significant for diabetes and hyperlipidemia.  Hypertension This is a chronic problem. The current episode started more than 1 year ago. The problem is controlled. Pertinent negatives include no chest pain, headaches, neck pain, palpitations or shortness of breath. Risk factors for coronary artery disease include dyslipidemia, diabetes mellitus, obesity and post-menopausal state. Past treatments include beta blockers. The current treatment provides moderate improvement. Compliance problems include diet and exercise.  Hypertensive end-organ damage includes a thyroid problem.  Diabetes She presents for her follow-up diabetic visit. She has type 2 diabetes mellitus. No MedicAlert identification noted. There are no hypoglycemic associated symptoms. Pertinent negatives for hypoglycemia include no headaches. Pertinent negatives for diabetes include no chest pain. There are no hypoglycemic complications. Symptoms are stable. Risk factors for coronary artery disease include tobacco exposure, hypertension and dyslipidemia. Her weight is stable. When asked about meal planning, she reported none. She has not had a previous visit with a dietitian. She rarely participates in exercise. Her breakfast blood glucose is taken between 8-9 am. Her breakfast blood glucose range is generally 110-130 mg/dl. Her highest blood glucose is >200 mg/dl. Her overall blood glucose range is 130-140 mg/dl. An ACE inhibitor/angiotensin II receptor blocker is not being taken. She does not see a podiatrist.Eye exam is not current.     Review of Systems  Constitutional: Negative for diaphoresis.  Respiratory: Negative for shortness of breath.   Cardiovascular: Negative for chest pain and palpitations.    Gastrointestinal: Negative for diarrhea and constipation.  Endocrine: Negative for heat  intolerance.  Genitourinary: Negative for menstrual problem.  Musculoskeletal: Negative for neck pain.  Neurological: Negative for headaches.       Objective:   Physical Exam  Constitutional: She is oriented to person, place, and time. She appears well-developed and well-nourished.  HENT:  Nose: Nose normal.  Mouth/Throat: Oropharynx is clear and moist.  Eyes: EOM are normal.  Neck: Trachea normal, normal range of motion and full passive range of motion without pain. Neck supple. No JVD present. Carotid bruit is not present. No thyromegaly present.  Cardiovascular: Normal rate, regular rhythm, normal heart sounds and intact distal pulses.  Exam reveals no gallop and no friction rub.   No murmur heard. Pulmonary/Chest: Effort normal and breath sounds normal.  Abdominal: Soft. Bowel sounds are normal. She exhibits no distension and no mass. There is no tenderness.  Musculoskeletal: Normal range of motion.  Lymphadenopathy:    She has no cervical adenopathy.  Neurological: She is alert and oriented to person, place, and time. She has normal reflexes.  Skin: Skin is warm and dry.  Psychiatric: She has a normal mood and affect. Her behavior is normal. Judgment and thought content normal.   BP 146/86 mmHg  Pulse 76  Temp(Src) 96.8 F (36 C) (Oral)  Ht 5' 1" (1.549 m)  Wt 143 lb (64.864 kg)  BMI 27.03 kg/m2      Results for orders placed or performed in visit on 03/08/15  POCT glycosylated hemoglobin (Hb A1C)  Result Value Ref Range   Hemoglobin A1C 6.0     Assessment & Plan:   1. Type 2 diabetes mellitus without complication, without long-term current use of insulin (HCC) Continue carb counting - POCT glycosylated hemoglobin (Hb A1C) - metFORMIN (GLUCOPHAGE-XR) 500 MG 24 hr tablet; TAKE 1 TABLET (500 MG TOTAL) BY MOUTH DAILY WITH BREAKFAST.  Dispense: 90 tablet; Refill: 1  2. Hyperlipidemia Low fat diet - Lipid panel - atorvastatin (LIPITOR) 40 MG tablet; TAKE 1 TABLET  (40 MG TOTAL) BY MOUTH DAILY.  Dispense: 90 tablet; Refill: 1  3. Essential hypertension Do not add salt o diet - CMP14+EGFR - amLODipine (NORVASC) 5 MG tablet; Take 0.5 tablets (2.5 mg total) by mouth daily.  Dispense: 45 tablet; Refill: 0 - atenolol (TENORMIN) 50 MG tablet; TAKE 1 TABLET (50 MG TOTAL) BY MOUTH DAILY.  Dispense: 90 tablet; Refill: 1  4. Kidney disease, chronic, stage III (moderate, EGFR 30-59 ml/min)  5. Hypothyroidism due to acquired atrophy of thyroid - levothyroxine (SYNTHROID, LEVOTHROID) 88 MCG tablet; TAKE 1 TABLET (88 MCG TOTAL) BY MOUTH DAILY BEFORE BREAKFAST.  Dispense: 90 tablet; Refill: 1  6. Smoker  7. BMI 27.0-27.9,adult Discussed diet and exercise for person with BMI >25 Will recheck weight in 3-6 months    Encourage to get eye exam Labs pending Health maintenance reviewed Diet and exercise encouraged Continue all meds Follow up  In 3 month   [place], FNP

## 2015-03-09 LAB — CMP14+EGFR
ALK PHOS: 92 IU/L (ref 39–117)
ALT: 11 IU/L (ref 0–32)
AST: 18 IU/L (ref 0–40)
Albumin/Globulin Ratio: 1.4 (ref 1.1–2.5)
Albumin: 4 g/dL (ref 3.5–4.8)
BUN/Creatinine Ratio: 18 (ref 11–26)
BUN: 21 mg/dL (ref 8–27)
Bilirubin Total: 0.6 mg/dL (ref 0.0–1.2)
CO2: 23 mmol/L (ref 18–29)
CREATININE: 1.17 mg/dL — AB (ref 0.57–1.00)
Calcium: 9.4 mg/dL (ref 8.7–10.3)
Chloride: 99 mmol/L (ref 96–106)
GFR calc Af Amer: 52 mL/min/{1.73_m2} — ABNORMAL LOW (ref >59)
GFR calc non Af Amer: 45 mL/min/{1.73_m2} — ABNORMAL LOW (ref >59)
GLUCOSE: 128 mg/dL — AB (ref 65–99)
Globulin, Total: 2.9 g/dL (ref 1.5–4.5)
Potassium: 4.4 mmol/L (ref 3.5–5.2)
Sodium: 138 mmol/L (ref 134–144)
Total Protein: 6.9 g/dL (ref 6.0–8.5)

## 2015-03-09 LAB — LIPID PANEL
CHOLESTEROL TOTAL: 132 mg/dL (ref 100–199)
Chol/HDL Ratio: 3.1 ratio units (ref 0.0–4.4)
HDL: 43 mg/dL (ref >39)
LDL CALC: 64 mg/dL (ref 0–99)
TRIGLYCERIDES: 125 mg/dL (ref 0–149)
VLDL CHOLESTEROL CAL: 25 mg/dL (ref 5–40)

## 2015-03-16 ENCOUNTER — Other Ambulatory Visit: Payer: Self-pay | Admitting: Nurse Practitioner

## 2015-06-23 DIAGNOSIS — Z961 Presence of intraocular lens: Secondary | ICD-10-CM | POA: Diagnosis not present

## 2015-06-23 DIAGNOSIS — E119 Type 2 diabetes mellitus without complications: Secondary | ICD-10-CM | POA: Diagnosis not present

## 2015-06-23 LAB — HM DIABETES EYE EXAM

## 2015-07-11 ENCOUNTER — Ambulatory Visit (INDEPENDENT_AMBULATORY_CARE_PROVIDER_SITE_OTHER): Payer: Medicare Other | Admitting: Nurse Practitioner

## 2015-07-11 ENCOUNTER — Encounter: Payer: Self-pay | Admitting: Nurse Practitioner

## 2015-07-11 VITALS — BP 152/88 | HR 70 | Temp 96.8°F | Ht 61.0 in | Wt 139.0 lb

## 2015-07-11 DIAGNOSIS — N183 Chronic kidney disease, stage 3 unspecified: Secondary | ICD-10-CM

## 2015-07-11 DIAGNOSIS — I10 Essential (primary) hypertension: Secondary | ICD-10-CM

## 2015-07-11 DIAGNOSIS — E785 Hyperlipidemia, unspecified: Secondary | ICD-10-CM | POA: Diagnosis not present

## 2015-07-11 DIAGNOSIS — Z6827 Body mass index (BMI) 27.0-27.9, adult: Secondary | ICD-10-CM

## 2015-07-11 DIAGNOSIS — E038 Other specified hypothyroidism: Secondary | ICD-10-CM | POA: Diagnosis not present

## 2015-07-11 DIAGNOSIS — E119 Type 2 diabetes mellitus without complications: Secondary | ICD-10-CM | POA: Diagnosis not present

## 2015-07-11 DIAGNOSIS — E034 Atrophy of thyroid (acquired): Secondary | ICD-10-CM | POA: Diagnosis not present

## 2015-07-11 LAB — BAYER DCA HB A1C WAIVED: HB A1C (BAYER DCA - WAIVED): 5.9 % (ref <7.0)

## 2015-07-11 MED ORDER — ATORVASTATIN CALCIUM 40 MG PO TABS: ORAL_TABLET | ORAL | Status: DC

## 2015-07-11 MED ORDER — LEVOTHYROXINE SODIUM 88 MCG PO TABS: ORAL_TABLET | ORAL | Status: DC

## 2015-07-11 MED ORDER — METFORMIN HCL ER 500 MG PO TB24: ORAL_TABLET | ORAL | Status: DC

## 2015-07-11 MED ORDER — ATENOLOL 50 MG PO TABS: ORAL_TABLET | ORAL | Status: DC

## 2015-07-11 MED ORDER — AMLODIPINE BESYLATE 5 MG PO TABS: 2.5000 mg | ORAL_TABLET | Freq: Every day | ORAL | Status: DC

## 2015-07-11 NOTE — Progress Notes (Signed)
Subjective:    Patient ID: Michele Walters, female    DOB: 1935-10-05, 80 y.o.   MRN: [id number]   HPI:  Patient here today for follow up of chronic medical problems.  Outpatient Encounter Prescriptions as of 07/11/2015  Medication Sig  . amLODipine (NORVASC) 5 MG tablet Take 0.5 tablets (2.5 mg total) by mouth daily.  Marland Kitchen aspirin 81 MG tablet Take 81 mg by mouth daily.  Marland Kitchen atenolol (TENORMIN) 50 MG tablet TAKE 1 TABLET (50 MG TOTAL) BY MOUTH DAILY.  Marland Kitchen atorvastatin (LIPITOR) 40 MG tablet TAKE 1 TABLET (40 MG TOTAL) BY MOUTH DAILY.  . calcium carbonate (OS-CAL) 600 MG TABS tablet Take 600 mg by mouth daily.  . [employer] Lancets Ultra Thin MISC Check blood sugar daily and PRN  . levothyroxine (SYNTHROID, LEVOTHROID) 88 MCG tablet TAKE 1 TABLET (88 MCG TOTAL) BY MOUTH DAILY BEFORE BREAKFAST.  . metFORMIN (GLUCOPHAGE-XR) 500 MG 24 hr tablet TAKE 1 TABLET (500 MG TOTAL) BY MOUTH DAILY WITH BREAKFAST.  Marland Kitchen Omega-3 Fatty Acids (FISH OIL) 1000 MG CAPS Take 1 capsule by mouth daily.  Glory Rosebush VERIO test strip TEST ONCE A DAY              Hyperlipidemia This is a chronic problem. The current episode started more than 1 year ago. The problem is controlled. Exacerbating diseases include diabetes. She has no history of hypothyroidism. Pertinent negatives include no chest pain or shortness of breath. Current antihyperlipidemic treatment includes statins. The current treatment provides moderate improvement of lipids. Compliance problems include adherence to diet and adherence to exercise.  Risk factors for coronary artery disease include diabetes mellitus, dyslipidemia, hypertension and obesity.  Thyroid Problem Visit type: hypothyroidism. Patient reports no constipation, diaphoresis, diarrhea, heat intolerance, menstrual problem or palpitations. The symptoms have been stable. Her past medical history is significant for diabetes and hyperlipidemia.  Hypertension This is a chronic problem. The current episode  started more than 1 year ago. The problem is controlled. Pertinent negatives include no chest pain, headaches, neck pain, palpitations or shortness of breath. Risk factors for coronary artery disease include dyslipidemia, diabetes mellitus, obesity and post-menopausal state. Past treatments include beta blockers. The current treatment provides moderate improvement. Compliance problems include diet and exercise.  Hypertensive end-organ damage includes a thyroid problem.  Diabetes She presents for her follow-up diabetic visit. She has type 2 diabetes mellitus. No MedicAlert identification noted. There are no hypoglycemic associated symptoms. Pertinent negatives for hypoglycemia include no headaches. Pertinent negatives for diabetes include no chest pain. There are no hypoglycemic complications. Symptoms are stable. Risk factors for coronary artery disease include tobacco exposure, hypertension and dyslipidemia. Her weight is stable. When asked about meal planning, she reported none. She has not had a previous visit with a dietitian. She rarely participates in exercise. Her breakfast blood glucose is taken between 8-9 am. Her breakfast blood glucose range is generally 110-130 mg/dl. Her highest blood glucose is >200 mg/dl. Her overall blood glucose range is 130-140 mg/dl. An ACE inhibitor/angiotensin II receptor blocker is not being taken. She does not see a podiatrist.Eye exam is not current.     Review of Systems  Constitutional: Negative for diaphoresis.  Respiratory: Negative for shortness of breath.   Cardiovascular: Negative for chest pain and palpitations.  Gastrointestinal: Negative for diarrhea and constipation.  Endocrine: Negative for heat intolerance.  Genitourinary: Negative for menstrual problem.  Musculoskeletal: Negative for neck pain.  Neurological: Negative for headaches.       Objective:  Physical Exam  Constitutional: She is oriented to person, place, and time. She appears  well-developed and well-nourished.  HENT:  Nose: Nose normal.  Mouth/Throat: Oropharynx is clear and moist.  Eyes: EOM are normal.  Neck: Trachea normal, normal range of motion and full passive range of motion without pain. Neck supple. No JVD present. Carotid bruit is not present. No thyromegaly present.  Cardiovascular: Normal rate, regular rhythm, normal heart sounds and intact distal pulses.  Exam reveals no gallop and no friction rub.   No murmur heard. Pulmonary/Chest: Effort normal and breath sounds normal.  Abdominal: Soft. Bowel sounds are normal. She exhibits no distension and no mass. There is no tenderness.  Musculoskeletal: Normal range of motion.  Lymphadenopathy:    She has no cervical adenopathy.  Neurological: She is alert and oriented to person, place, and time. She has normal reflexes.  Skin: Skin is warm and dry.  Psychiatric: She has a normal mood and affect. Her behavior is normal. Judgment and thought content normal.    BP 152/88 mmHg  Pulse 70  Temp(Src) 96.8 F (36 C) (Oral)  Ht 5' 1" (1.549 m)  Wt 139 lb (63.05 kg)  BMI 26.28 kg/m2    hgba1c 5.9%-      Assessment & Plan:   1. Type 2 diabetes mellitus without complication, without long-term current use of insulin (HCC) Continue to watch carbs in diet - Bayer DCA Hb A1c Waived - metFORMIN (GLUCOPHAGE-XR) 500 MG 24 hr tablet; TAKE 1 TABLET (500 MG TOTAL) BY MOUTH DAILY WITH BREAKFAST.  Dispense: 90 tablet; Refill: 1  2. Hyperlipidemia Low fat diet - Lipid panel - atorvastatin (LIPITOR) 40 MG tablet; TAKE 1 TABLET (40 MG TOTAL) BY MOUTH DAILY.  Dispense: 90 tablet; Refill: 1  3. Essential hypertension Do not add salt to diet - CMP14+EGFR - amLODipine (NORVASC) 5 MG tablet; Take 0.5 tablets (2.5 mg total) by mouth daily.  Dispense: 45 tablet; Refill: 1 - atenolol (TENORMIN) 50 MG tablet; TAKE 1 TABLET (50 MG TOTAL) BY MOUTH DAILY.  Dispense: 90 tablet; Refill: 1  4. Hypothyroidism due to acquired  atrophy of thyroid - levothyroxine (SYNTHROID, LEVOTHROID) 88 MCG tablet; TAKE 1 TABLET (88 MCG TOTAL) BY MOUTH DAILY BEFORE BREAKFAST.  Dispense: 90 tablet; Refill: 1  5. Kidney disease, chronic, stage III (moderate, EGFR 30-59 ml/min)  6. BMI 27.0-27.9,adult Discussed diet and exercise for person with BMI >25 Will recheck weight in 3-6 months     Labs pending Health maintenance reviewed Diet and exercise encouraged Continue all meds Follow up  In 3 months   [place], FNP

## 2015-07-11 NOTE — Patient Instructions (Signed)
Diabetes and Foot Care Diabetes may cause you to have problems because of poor blood supply (circulation) to your feet and legs. This may cause the skin on your feet to become thinner, break easier, and heal more slowly. Your skin may become dry, and the skin may peel and crack. You may also have nerve damage in your legs and feet causing decreased feeling in them. You may not notice minor injuries to your feet that could lead to infections or more serious problems. Taking care of your feet is one of the most important things you can do for yourself.  HOME CARE INSTRUCTIONS  Wear shoes at all times, even in the house. Do not go barefoot. Bare feet are easily injured.  Check your feet daily for blisters, cuts, and redness. If you cannot see the bottom of your feet, use a mirror or ask someone for help.  Wash your feet with warm water (do not use hot water) and mild soap. Then pat your feet and the areas between your toes until they are completely dry. Do not soak your feet as this can dry your skin.  Apply a moisturizing lotion or petroleum jelly (that does not contain alcohol and is unscented) to the skin on your feet and to dry, brittle toenails. Do not apply lotion between your toes.  Trim your toenails straight across. Do not dig under them or around the cuticle. File the edges of your nails with an emery board or nail file.  Do not cut corns or calluses or try to remove them with medicine.  Wear clean socks or stockings every day. Make sure they are not too tight. Do not wear knee-high stockings since they may decrease blood flow to your legs.  Wear shoes that fit properly and have enough cushioning. To break in new shoes, wear them for just a few hours a day. This prevents you from injuring your feet. Always look in your shoes before you put them on to be sure there are no objects inside.  Do not cross your legs. This may decrease the blood flow to your feet.  If you find a minor scrape,  cut, or break in the skin on your feet, keep it and the skin around it clean and dry. These areas may be cleansed with mild soap and water. Do not cleanse the area with peroxide, alcohol, or iodine.  When you remove an adhesive bandage, be sure not to damage the skin around it.  If you have a wound, look at it several times a day to make sure it is healing.  Do not use heating pads or hot water bottles. They may burn your skin. If you have lost feeling in your feet or legs, you may not know it is happening until it is too late.  Make sure your health care provider performs a complete foot exam at least annually or more often if you have foot problems. Report any cuts, sores, or bruises to your health care provider immediately. SEEK MEDICAL CARE IF:   You have an injury that is not healing.  You have cuts or breaks in the skin.  You have an ingrown nail.  You notice redness on your legs or feet.  You feel burning or tingling in your legs or feet.  You have pain or cramps in your legs and feet.  Your legs or feet are numb.  Your feet always feel cold. SEEK IMMEDIATE MEDICAL CARE IF:   There is increasing redness,   swelling, or pain in or around a wound.  There is a red line that goes up your leg.  Pus is coming from a wound.  You develop a fever or as directed by your health care provider.  You notice a bad smell coming from an ulcer or wound.   This information is not intended to replace advice given to you by your health care provider. Make sure you discuss any questions you have with your health care provider.   Document Released: 01/20/2000 Document Revised: 09/24/2012 Document Reviewed: 07/01/2012 Elsevier Interactive Patient Education 2016 [employer]  

## 2015-07-12 ENCOUNTER — Telehealth: Payer: Self-pay | Admitting: *Deleted

## 2015-07-12 LAB — CMP14+EGFR
A/G RATIO: 1.3 (ref 1.2–2.2)
ALBUMIN: 3.9 g/dL (ref 3.5–4.8)
ALT: 9 IU/L (ref 0–32)
AST: 19 IU/L (ref 0–40)
Alkaline Phosphatase: 76 IU/L (ref 39–117)
BILIRUBIN TOTAL: 0.5 mg/dL (ref 0.0–1.2)
BUN / CREAT RATIO: 21 (ref 12–28)
BUN: 25 mg/dL (ref 8–27)
CALCIUM: 9.4 mg/dL (ref 8.7–10.3)
CHLORIDE: 98 mmol/L (ref 96–106)
CO2: 24 mmol/L (ref 18–29)
Creatinine, Ser: 1.18 mg/dL — ABNORMAL HIGH (ref 0.57–1.00)
GFR, EST AFRICAN AMERICAN: 51 mL/min/{1.73_m2} — AB (ref >59)
GFR, EST NON AFRICAN AMERICAN: 45 mL/min/{1.73_m2} — AB (ref >59)
GLOBULIN, TOTAL: 3.1 g/dL (ref 1.5–4.5)
Glucose: 119 mg/dL — ABNORMAL HIGH (ref 65–99)
POTASSIUM: 4.5 mmol/L (ref 3.5–5.2)
SODIUM: 138 mmol/L (ref 134–144)
TOTAL PROTEIN: 7 g/dL (ref 6.0–8.5)

## 2015-07-12 LAB — LIPID PANEL
CHOL/HDL RATIO: 3.3 ratio (ref 0.0–4.4)
Cholesterol, Total: 126 mg/dL (ref 100–199)
HDL: 38 mg/dL — AB (ref >39)
LDL Calculated: 65 mg/dL (ref 0–99)
Triglycerides: 116 mg/dL (ref 0–149)
VLDL Cholesterol Cal: 23 mg/dL (ref 5–40)

## 2015-07-12 NOTE — Telephone Encounter (Signed)
-----   [facility] Martin, FNP sent at 07/12/2015  7:48 AM EDT ----- Kidney and liver function stable Blood sugar slightly elevated Cholesterol looks good Hgba1c discussed at appointment Continue current meds- low fat diet and exercise and recheck in 3 months

## 2015-07-12 NOTE — Telephone Encounter (Signed)
Left message for pt with lab results

## 2015-10-26 ENCOUNTER — Encounter: Payer: Self-pay | Admitting: Pharmacist

## 2015-10-26 ENCOUNTER — Ambulatory Visit (INDEPENDENT_AMBULATORY_CARE_PROVIDER_SITE_OTHER): Payer: Medicare Other | Admitting: Pharmacist

## 2015-10-26 VITALS — BP 142/84 | HR 76 | Ht 61.0 in | Wt 133.0 lb

## 2015-10-26 DIAGNOSIS — Z0000 Encounter for general adult medical examination without abnormal findings: Secondary | ICD-10-CM | POA: Diagnosis not present

## 2015-10-26 DIAGNOSIS — E119 Type 2 diabetes mellitus without complications: Secondary | ICD-10-CM

## 2015-10-26 DIAGNOSIS — Z23 Encounter for immunization: Secondary | ICD-10-CM

## 2015-10-26 MED ORDER — ONETOUCH DELICA LANCETS 33G MISC: 3 refills | Status: DC

## 2015-10-26 NOTE — Patient Instructions (Addendum)
Michele Walters , Thank you for taking time to come for your Medicare Wellness Visit. I appreciate your ongoing commitment to your health goals. Please review the following plan we discussed and let me know if I can assist you in the future.   These are the goals we discussed: Increase intensity of walking to exercise heart - should not be able to sing when exercising but still able to talk.     Increase non-starchy vegetables - carrots, green bean, squash, zucchini, tomatoes, onions, peppers, spinach and other green leafy vegetables, cabbage, lettuce, cucumbers, asparagus, okra (not fried), eggplant Limit sugar and processed foods (cakes, cookies, ice cream, crackers and chips) Increase fresh fruit but limit serving sizes 1/2 cup or about the size of tennis or baseball Limit red meat to no more than 1-2 times per week (serving size about the size of your palm) Choose whole grains / lean proteins - whole wheat bread, quinoa, whole grain rice (1/2 cup), fish, chicken, [place] Avoid sugar and calorie containing beverages - soda, sweet tea and juice.  Choose water or unsweetened tea instead.  Consider Lung CT - testing to detect lung cancer recommended for smokers - call me if you change your mind.  We can also set up balance assessment with physical therapy if you would like   This is a list of the screening recommended for you and due dates:  Health Maintenance  Topic Date Due  . Flu Shot  Done today  . Urine Protein Check  done today  . Tetanus Vaccine  02/07/2016*  . Hemoglobin A1C  01/10/2016  . Eye exam for diabetics  06/22/2016  . Complete foot exam   07/10/2016  . DEXA scan (bone density measurement)  07/15/2016  . Shingles Vaccine  Completed  . Pneumonia vaccines  Completed  *Topic was postponed. The date shown is not the original due date.   Health Maintenance, Female Adopting a healthy lifestyle and getting preventive care can go a long way to promote health and wellness. Talk  with your health care provider about what schedule of regular examinations is right for you. This is a good chance for you to check in with your provider about disease prevention and staying healthy. In between checkups, there are plenty of things you can do on your own. Experts have done a lot of research about which lifestyle changes and preventive measures are most likely to keep you healthy. Ask your health care provider for more information. WEIGHT AND DIET  Eat a healthy diet  Be sure to include plenty of vegetables, fruits, low-fat dairy products, and lean protein.  Do not eat a lot of foods high in solid fats, added sugars, or salt.  Get regular exercise. This is one of the most important things you can do for your health.  Most adults should exercise for at least 150 minutes each week. The exercise should increase your heart rate and make you sweat (moderate-intensity exercise).  Most adults should also do strengthening exercises at least twice a week. This is in addition to the moderate-intensity exercise.  Maintain a healthy weight  Body mass index (BMI) is a measurement that can be used to identify possible weight problems. It estimates body fat based on height and weight. Your health care provider can help determine your BMI and help you achieve or maintain a healthy weight.  For females 30 years of age and older:   A BMI below 18.5 is considered underweight.  A BMI of 18.5  to 24.9 is normal.  A BMI of 25 to 29.9 is considered overweight.  A BMI of 30 and above is considered obese.  Watch levels of cholesterol and blood lipids  You should start having your blood tested for lipids and cholesterol at 78 years of age, then have this test every 5 years.  You may need to have your cholesterol levels checked more often if:  Your lipid or cholesterol levels are high.  You are older than 78 years of age.  You are at high risk for heart disease.  CANCER SCREENING    Lung Cancer  Lung cancer screening is recommended for adults 55-50 years old who are at high risk for lung cancer because of a history of smoking.  A yearly low-dose CT scan of the lungs is recommended for people who:  Currently smoke.  Have quit within the past 15 years.  Have at least a 30-pack-year history of smoking. A pack year is smoking an average of one pack of cigarettes a day for 1 year.  Yearly screening should continue until it has been 15 years since you quit.  Yearly screening should stop if you develop a health problem that would prevent you from having lung cancer treatment.  Breast Cancer  Practice breast self-awareness. This means understanding how your breasts normally appear and feel.  It also means doing regular breast self-exams. Let your health care provider know about any changes, no matter how small.  If you are in your 20s or 30s, you should have a clinical breast exam (CBE) by a health care provider every 1-3 years as part of a regular health exam.  If you are 52 or older, have a CBE every year. Also consider having a breast X-ray (mammogram) every year.  If you have a family history of breast cancer, talk to your health care provider about genetic screening.  If you are at high risk for breast cancer, talk to your health care provider about having an MRI and a mammogram every year.  Breast cancer gene (BRCA) assessment is recommended for women who have family members with BRCA-related cancers. BRCA-related cancers include:  Breast.  Ovarian.  Tubal.  Peritoneal cancers.  Results of the assessment will determine the need for genetic counseling and BRCA1 and BRCA2 testing. Cervical Cancer Your health care provider may recommend that you be screened regularly for cancer of the pelvic organs (ovaries, uterus, and vagina). This screening involves a pelvic examination, including checking for microscopic changes to the surface of your cervix (Pap  test). You may be encouraged to have this screening done every 3 years, beginning at age 68.  For women ages 13-65, health care providers may recommend pelvic exams and Pap testing every 3 years, or they may recommend the Pap and pelvic exam, combined with testing for human papilloma virus (HPV), every 5 years. Some types of HPV increase your risk of cervical cancer. Testing for HPV may also be done on women of any age with unclear Pap test results.  Other health care providers may not recommend any screening for nonpregnant women who are considered low risk for pelvic cancer and who do not have symptoms. Ask your health care provider if a screening pelvic exam is right for you.  If you have had past treatment for cervical cancer or a condition that could lead to cancer, you need Pap tests and screening for cancer for at least 20 years after your treatment. If Pap tests have been discontinued, your  risk factors (such as having a new sexual partner) need to be reassessed to determine if screening should resume. Some women have medical problems that increase the chance of getting cervical cancer. In these cases, your health care provider may recommend more frequent screening and Pap tests. Colorectal Cancer  This type of cancer can be detected and often prevented.  Routine colorectal cancer screening usually begins at 78 years of age and continues through 78 years of age.  Your health care provider may recommend screening at an earlier age if you have risk factors for colon cancer.  Your health care provider may also recommend using home test kits to check for hidden blood in the stool.  A small camera at the end of a tube can be used to examine your colon directly (sigmoidoscopy or colonoscopy). This is done to check for the earliest forms of colorectal cancer.  Routine screening usually begins at age 39.  Direct examination of the colon should be repeated every 5-10 years through 78 years of  age. However, you may need to be screened more often if early forms of precancerous polyps or small growths are found. Skin Cancer  Check your skin from head to toe regularly.  Tell your health care provider about any new moles or changes in moles, especially if there is a change in a mole's shape or color.  Also tell your health care provider if you have a mole that is larger than the size of a pencil eraser.  Always use sunscreen. Apply sunscreen liberally and repeatedly throughout the day.  Protect yourself by wearing long sleeves, pants, a wide-brimmed hat, and sunglasses whenever you are outside. HEART DISEASE, DIABETES, AND HIGH BLOOD PRESSURE   High blood pressure causes heart disease and increases the risk of stroke. High blood pressure is more likely to develop in:  People who have blood pressure in the high end of the normal range (130-139/85-89 mm Hg).  People who are overweight or obese.  People who are African American.  If you are 3-54 years of age, have your blood pressure checked every 3-5 years. If you are 65 years of age or older, have your blood pressure checked every year. You should have your blood pressure measured twice--once when you are at a hospital or clinic, and once when you are not at a hospital or clinic. Record the average of the two measurements. To check your blood pressure when you are not at a hospital or clinic, you can use:  An automated blood pressure machine at a pharmacy.  A home blood pressure monitor.  If you are between 58 years and 24 years old, ask your health care provider if you should take aspirin to prevent strokes.  Have regular diabetes screenings. This involves taking a blood sample to check your fasting blood sugar level.  If you are at a normal weight and have a low risk for diabetes, have this test once every three years after 78 years of age.  If you are overweight and have a high risk for diabetes, consider being tested at a  younger age or more often. PREVENTING INFECTION  Hepatitis B  If you have a higher risk for hepatitis B, you should be screened for this virus. You are considered at high risk for hepatitis B if:  You were born in a country where hepatitis B is common. Ask your health care provider which countries are considered high risk.  Your parents were born in a high-risk country,  and you have not been immunized against hepatitis B (hepatitis B vaccine).  You have HIV or AIDS.  You use needles to inject street drugs.  You live with someone who has hepatitis B.  You have had sex with someone who has hepatitis B.  You get hemodialysis treatment.  You take certain medicines for conditions, including cancer, organ transplantation, and autoimmune conditions. Hepatitis C  Blood testing is recommended for:  Everyone born from [birth year] through 1963.  Anyone with known risk factors for hepatitis C. Sexually transmitted infections (STIs)  You should be screened for sexually transmitted infections (STIs) including gonorrhea and chlamydia if:  You are sexually active and are younger than 78 years of age.  You are older than 78 years of age and your health care provider tells you that you are at risk for this type of infection.  Your sexual activity has changed since you were last screened and you are at an increased risk for chlamydia or gonorrhea. Ask your health care provider if you are at risk.  If you do not have HIV, but are at risk, it may be recommended that you take a prescription medicine daily to prevent HIV infection. This is called pre-exposure prophylaxis (PrEP). You are considered at risk if:  You are sexually active and do not regularly use condoms or know the HIV status of your partner(s).  You take drugs by injection.  You are sexually active with a partner who has HIV. Talk with your health care provider about whether you are at high risk of being infected with HIV. If you choose  to begin PrEP, you should first be tested for HIV. You should then be tested every 3 months for as long as you are taking PrEP.  PREGNANCY   If you are premenopausal and you may become pregnant, ask your health care provider about preconception counseling.  If you may become pregnant, take 400 to 800 micrograms (mcg) of folic acid every day.  If you want to prevent pregnancy, talk to your health care provider about birth control (contraception). OSTEOPOROSIS AND MENOPAUSE   Osteoporosis is a disease in which the bones lose minerals and strength with aging. This can result in serious bone fractures. Your risk for osteoporosis can be identified using a bone density scan.  If you are 38 years of age or older, or if you are at risk for osteoporosis and fractures, ask your health care provider if you should be screened.  Ask your health care provider whether you should take a calcium or vitamin D supplement to lower your risk for osteoporosis.  Menopause may have certain physical symptoms and risks.  Hormone replacement therapy may reduce some of these symptoms and risks. Talk to your health care provider about whether hormone replacement therapy is right for you.  HOME CARE INSTRUCTIONS   Schedule regular health, dental, and eye exams.  Stay current with your immunizations.   Do not use any tobacco products including cigarettes, chewing tobacco, or electronic cigarettes.  If you are pregnant, do not drink alcohol.  If you are breastfeeding, limit how much and how often you drink alcohol.  Limit alcohol intake to no more than 1 drink per day for nonpregnant women. One drink equals 12 ounces of beer, 5 ounces of wine, or 1 ounces of hard liquor.  Do not use street drugs.  Do not share needles.  Ask your health care provider for help if you need support or information about quitting drugs.  Tell your health care provider if you often feel depressed.  Tell your health care  provider if you have ever been abused or do not feel safe at home.   This information is not intended to replace advice given to you by your health care provider. Make sure you discuss any questions you have with your health care provider.   Document Released: 08/07/2010 Document Revised: 02/12/2014 Document Reviewed: 12/24/2012 Elsevier Interactive Patient Education [employer].

## 2015-10-27 ENCOUNTER — Encounter: Payer: Self-pay | Admitting: Pharmacist

## 2015-10-27 LAB — MICROALBUMIN / CREATININE URINE RATIO
CREATININE, UR: 194 mg/dL
MICROALB/CREAT RATIO: 56.6 mg/g{creat} — AB (ref 0.0–30.0)
Microalbumin, Urine: 109.8 ug/mL

## 2015-10-27 NOTE — Progress Notes (Signed)
Patient ID: Michele Walters, female   DOB: 17-Oct-1935, 80 y.o.   MRN: [id number]    Subjective:   Michele Walters is a 80 y.o. female who presents for a Subsequent Medicare Annual Wellness Visit.  She lives by herself in [place] but her sister lives just next door and is very close if she need someone.  [name] is retired and previously worked in 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  She denies any acute medical concerns today.  Current Medications (verified) Outpatient Encounter Prescriptions as of 10/20/2014  Medication Sig  . aspirin 81 MG tablet Take 81 mg by mouth daily.  Marland Kitchen atorvastatin (LIPITOR) 40 MG tablet Take 1 tablet (40 mg total) by mouth daily.  . calcium carbonate (OS-CAL) 600 MG TABS tablet Take 600 mg by mouth daily.  . cetirizine (ZYRTEC) 10 MG tablet Take 10 mg by mouth daily as needed.  . [facility] Lancets Ultra Thin MISC Check blood sugar daily and PRN  . levothyroxine (SYNTHROID, LEVOTHROID) 88 MCG tablet TAKE 1 TABLET (88 MCG TOTAL) BY MOUTH DAILY BEFORE BREAKFAST.  . metFORMIN (GLUCOPHAGE-XR) 500 MG 24 hr tablet TAKE 1 TABLET (500 MG TOTAL) BY MOUTH DAILY WITH BREAKFAST.  Marland Kitchen Omega-3 Fatty Acids (FISH OIL) 1000 MG CAPS Take 1 capsule by mouth daily.  Letta Pate VERIO test strip TEST ONCE A DAY  . atenolol (TENORMIN) 50 MG tablet Take 1 tablet (50 mg total) by mouth daily. (Patient taking differently: Take 25 mg by mouth daily. )  .    .  acyclovir (ZOVIRAX) 800 MG tablet Take 1 tablet (800 mg total) by mouth 5 (five) times daily. (Patient not taking: Reported on 10/20/2014)  .  ONETOUCH VERIO test strip TEST ONCE A DAY  .  rosuvastatin (CRESTOR) 10 MG tablet Take 1 tablet (10 mg total) by mouth daily. (Patient not taking: Reported on 10/20/2014)   No facility-administered encounter medications on file as of 10/20/2014.    Allergies (verified) Review of patient's allergies indicates no known allergies.   History: Past Medical History:    Diagnosis Date  . Allergy   . Cataract   . Diabetes mellitus without complication (HCC)   . Hyperlipidemia   . Hypertension   . Thyroid disease    Past Surgical History:  Procedure Laterality Date  . ABDOMINAL HYSTERECTOMY     partial but later ovaries removed  . EYE SURGERY Bilateral    cataract  . OOPHORECTOMY Bilateral    Family History  Problem Relation Age of Onset  . Heart disease Mother   . Heart attack Mother     massive  . COPD Father   . Emphysema Father   . Down syndrome Brother    Social History   Occupational History  . Not on file.   Social History Main Topics  . Smoking status: Current Every Day Smoker    Packs/day: 0.50    Years: 55.00    Types: Cigarettes  . Smokeless tobacco: Never Used  . Alcohol use No  . Drug use: No  . Sexual activity: No    Do you feel safe at home?  Yes  Dietary issues and exercise activities: Current Exercise Habits: Home exercise routine, Type of exercise: walking, Time (Minutes): 10, Frequency (Times/Week): 5, Weekly Exercise (Minutes/Week): 50, Intensity: Mild  Current Dietary habits:  Patient tries to limit sweets due to diabetes.  She states that she has been cooking less lately because it is  just one person to cook for.  She has salads, sandwiches and sometimes cooks a baked potato.  Objective:    Today's Vitals   10/26/15 0924  BP: (!) 142/84  Pulse: 76  Weight: 133 lb (60.3 kg)  Height: 5\' 1"  (1.549 m)   Body mass index is 25.13 kg/m.  Activities of Daily Living In your present state of health, do you have any difficulty performing the following activities: 10/26/2015  Hearing? Y  Vision? N  Difficulty concentrating or making decisions? N  Walking or climbing stairs? N  Dressing or bathing? N  Doing errands, shopping? N  Preparing Food and eating ? N  Using the Toilet? N  In the past six months, have you accidently leaked urine? N  Do you have problems with loss of bowel control? N  Managing  your Medications? N  Managing your Finances? N  Housekeeping or managing your Housekeeping? N  Some recent data might be hidden    Are there smokers in your home (other than you)? No   Cardiac Risk Factors include: advanced age (>60men, >88 women);diabetes mellitus;dyslipidemia;hypertension;smoking/ tobacco exposure  Depression Screen PHQ 2/9 Scores 10/26/2015 07/11/2015 03/08/2015 11/23/2014  PHQ - 2 Score 0 0 0 0    Fall Risk Fall Risk  10/26/2015 07/11/2015 03/08/2015 11/23/2014 10/20/2014  Falls in the past year? No No No No No  Risk for fall due to : - - - - -    Cognitive Function: MMSE - Mini Mental State Exam 10/26/2015 10/20/2014  Orientation to time 5 5  Orientation to Place 5 5  Registration 3 3  Attention/ Calculation 4 5  Recall 3 3  Language- name 2 objects 2 2  Language- repeat 1 1  Language- follow 3 step command 3 3  Language- read & follow direction 1 1  Write a sentence 1 1  Copy design 1 0  Total score 29 29    Immunizations and Health Maintenance Immunization History  Administered Date(s) Administered  . Influenza, High Dose Seasonal PF 10/16/2012, 11/03/2013, 11/02/2014, 10/26/2015  . Pneumococcal Conjugate-13 07/29/2014  . Pneumococcal Polysaccharide-23 11/14/2008  . Zoster 03/14/2008   Health Maintenance Due  Topic Date Due  . URINE MICROALBUMIN  10/20/2015    Patient Care Team: Bennie Pierini, FNP as PCP - General (Nurse Practitioner) Derryl Harbor, OD as Consulting Physician (Optometry)  Indicate any recent Medical Services you may have received from other than [facility] providers in the past year (date may be approximate).    Assessment:    Annual Wellness Visit    Screening Tests Health Maintenance  Topic Date Due  . URINE MICROALBUMIN  10/20/2015  . TETANUS/TDAP  02/07/2016 (Originally 02/06/2012)  . HEMOGLOBIN A1C  01/10/2016  . OPHTHALMOLOGY EXAM  06/22/2016  . FOOT EXAM  07/10/2016  . DEXA SCAN  07/15/2016  . INFLUENZA VACCINE   Completed  . ZOSTAVAX  Completed  . PNA vac Low Risk Adult  Completed        Plan:   During the course of the visit Kayliana was educated and counseled about the following appropriate screening and preventive services:   Vaccines to include Pneumoccal, Influenza, Hepatitis B, Td, Zostavax.  Patient received influenza vaccine today.  Discussed Tdap - patient declined  Colorectal cancer screening - declined FOBT   Cardiovascular disease screening - lipids UTD, last EKG 02/2014  Diabetes - A1c UTD and looks great.  Urine microalbumin rechecked today  Bone Denisty / Osteoporosis Screening - due 2018  Mammogram - patient refuses.  She continues to perform breast exams.   PAP - no longer required  Glaucoma screening / Diabetic Eye Exam - UTD  Nutrition counseling - reviewed foods that increase BP/BG.  Patient encouraged to increase non starchy vegetables and whole grains.  Smoking cessation counseling - discussed cutting back.  Patient declined pharmacotherapy for smoking cessation  Advanced Directives - copy requested  Recommended physical therapy referral for balance assessment as patient.  Patient declined  Continue walking around home daily - increase intensity as able.   Orders Placed This Encounter  Procedures  . Flu vaccine HIGH DOSE PF  . Microalbumin / creatinine urine ratio     Patient Instructions (the written plan) were given to the patient.   Henrene PastorEckard, Neila Teem, PharmD, CPP   10/27/2015        Patient ID: Gretel AcreLaura B Stroope, female   DOB: 08/27/1935, 80 y.o.   MRN: [id number]

## 2015-11-01 ENCOUNTER — Telehealth: Payer: Self-pay | Admitting: Pharmacist

## 2015-11-01 DIAGNOSIS — I10 Essential (primary) hypertension: Secondary | ICD-10-CM

## 2015-11-01 MED ORDER — AMLODIPINE BESYLATE 5 MG PO TABS: 5.0000 mg | ORAL_TABLET | Freq: Every day | ORAL | 0 refills | Status: DC

## 2015-11-01 NOTE — Telephone Encounter (Signed)
Recent microalbumin was elevated - BP was also elevated at last visit.  Patient has history of CKD but last serum creatinine was OK at 1.18 (GRF = 45).  She is taking amlodipine 5mg  1/2 tablet daily. Recommend increase to 5mg  qd.  Recheck BP in 2-3 weeks and recheck urine microalbumin in 3 months. If still elevated that consider low dose ACE or ARB. patient aware

## 2015-11-14 ENCOUNTER — Ambulatory Visit: Payer: Medicare Other | Admitting: Physician Assistant

## 2015-11-15 ENCOUNTER — Encounter: Payer: Self-pay | Admitting: Family Medicine

## 2015-11-15 ENCOUNTER — Ambulatory Visit (INDEPENDENT_AMBULATORY_CARE_PROVIDER_SITE_OTHER): Payer: Medicare Other | Admitting: Family Medicine

## 2015-11-15 VITALS — BP 166/80 | HR 74 | Temp 96.8°F | Ht 61.0 in | Wt 128.5 lb

## 2015-11-15 DIAGNOSIS — J40 Bronchitis, not specified as acute or chronic: Secondary | ICD-10-CM | POA: Diagnosis not present

## 2015-11-15 DIAGNOSIS — J329 Chronic sinusitis, unspecified: Secondary | ICD-10-CM | POA: Diagnosis not present

## 2015-11-15 DIAGNOSIS — H8303 Labyrinthitis, bilateral: Secondary | ICD-10-CM | POA: Diagnosis not present

## 2015-11-15 DIAGNOSIS — R42 Dizziness and giddiness: Secondary | ICD-10-CM | POA: Diagnosis not present

## 2015-11-15 MED ORDER — AMOXICILLIN-POT CLAVULANATE 875-125 MG PO TABS: 1.0000 | ORAL_TABLET | Freq: Two times a day (BID) | ORAL | 0 refills | Status: DC

## 2015-11-15 MED ORDER — MECLIZINE HCL 25 MG PO TABS: 25.0000 mg | ORAL_TABLET | Freq: Four times a day (QID) | ORAL | 1 refills | Status: DC | PRN

## 2015-11-15 NOTE — Progress Notes (Signed)
Subjective:  Patient ID: Michele Walters, female    DOB: 1935-11-09  Age: 80 y.o. MRN: [id number]  CC: Dizziness (pt here for what she thinks is vertigo as she has had it in the past. She is also c/o cough and congestion.)   HPI CHRISTELLE IGOE presents for Patient presents with upper respiratory congestion. She describes a sensation of room moving spinning making her feel off balance and a sensation that things were waving or moving back and forth in addition to spinning. No rhinorrhea. There is no sore throat. Patient reports coughing frequently as well. White-colored/purulent sputum noted. There is no fever no chills no sweats. The patient denies being short of breath. Onset was 1 week ago. Gradually worsening in spite of OTC meds.   History Arneta has a past medical history of Allergy; Cataract; Diabetes mellitus without complication (HCC); Hyperlipidemia; Hypertension; and Thyroid disease.   She has a past surgical history that includes Abdominal hysterectomy; Oophorectomy (Bilateral); and Eye surgery (Bilateral).   Her family history includes COPD in her father; Down syndrome in her brother; Emphysema in her father; Heart attack in her mother; Heart disease in her mother.She reports that she has been smoking Cigarettes.  She has a 27.50 pack-year smoking history. She has never used smokeless tobacco. She reports that she does not drink alcohol or use drugs.    ROS Review of Systems  Constitutional: Negative for activity change, appetite change and fever.  HENT: Negative for congestion, rhinorrhea and sore throat.   Eyes: Negative for visual disturbance.  Respiratory: Negative for cough and shortness of breath.   Cardiovascular: Negative for chest pain and palpitations.  Gastrointestinal: Negative for abdominal pain, diarrhea and nausea.  Genitourinary: Negative for dysuria.  Musculoskeletal: Negative for arthralgias and myalgias.  Neurological: Positive for dizziness and  light-headedness. Negative for headaches.    Objective:  BP (!) 176/88   Pulse 74   Temp (!) 96.8 F (36 C) (Oral)   Ht 5\' 1"  (1.549 m)   Wt 128 lb 8 oz (58.3 kg)   BMI 24.28 kg/m   BP Readings from Last 3 Encounters:  11/15/15 (!) 176/88  10/26/15 (!) 142/84  07/11/15 (!) 152/88    Wt Readings from Last 3 Encounters:  11/15/15 128 lb 8 oz (58.3 kg)  10/26/15 133 lb (60.3 kg)  07/11/15 139 lb (63 kg)     Physical Exam  Constitutional: She is oriented to person, place, and time. She appears well-developed and well-nourished.  HENT:  Head: Normocephalic and atraumatic.  Right Ear: Tympanic membrane and external ear normal. No decreased hearing is noted.  Left Ear: Tympanic membrane and external ear normal. No decreased hearing is noted.  Nose: Mucosal edema present. Right sinus exhibits no frontal sinus tenderness. Left sinus exhibits no frontal sinus tenderness.  Mouth/Throat: No oropharyngeal exudate or posterior oropharyngeal erythema.  Neck: No Brudzinski's sign noted.  Pulmonary/Chest: Breath sounds normal. No respiratory distress.  Musculoskeletal: Normal range of motion.  Lymphadenopathy:       Head (right side): No preauricular adenopathy present.       Head (left side): No preauricular adenopathy present.       Right cervical: No superficial cervical adenopathy present.      Left cervical: No superficial cervical adenopathy present.  Neurological: She is alert and oriented to person, place, and time. She displays normal reflexes. No cranial nerve deficit. She exhibits normal muscle tone. Coordination (omewhat unsteady due to the dizziness. However she is able  to walk with minimal deficit) abnormal.  Skin: Skin is warm and dry.  Psychiatric: She has a normal mood and affect.     Lab Results  Component Value Date   GLUCOSE 119 (H) 07/11/2015   CHOL 126 07/11/2015   TRIG 116 07/11/2015   HDL 38 (L) 07/11/2015   LDLCALC 65 07/11/2015   ALT 9 07/11/2015   AST  19 07/11/2015   NA 138 07/11/2015   K 4.5 07/11/2015   CL 98 07/11/2015   CREATININE 1.18 (H) 07/11/2015   BUN 25 07/11/2015   CO2 24 07/11/2015   TSH 0.793 10/20/2014   HGBA1C 6.0 03/08/2015    No results found.  Assessment & Plan:   Vernona RiegerLaura was seen today for dizziness.  Diagnoses and all orders for this visit:  Vertigo  Labyrinthitis of both ears  Sinobronchitis  Other orders -     amoxicillin-clavulanate (AUGMENTIN) 875-125 MG tablet; Take 1 tablet by mouth 2 (two) times daily. Take all of this medication -     meclizine (ANTIVERT) 25 MG tablet; Take 1 tablet (25 mg total) by mouth 4 (four) times daily as needed for dizziness.    The vertigo is likely secondary to infectious labyrinthitis which is related to her respiratory symptoms in the sinal bronchial cavities  I am having Ms. Bierlein start on amoxicillin-clavulanate and meclizine. I am also having her maintain her aspirin, Fish Oil, calcium carbonate, ONETOUCH VERIO, atenolol, atorvastatin, levothyroxine, metFORMIN, ONETOUCH DELICA LANCETS 33G, and amLODipine.  Meds ordered this encounter  Medications  . amoxicillin-clavulanate (AUGMENTIN) 875-125 MG tablet    Sig: Take 1 tablet by mouth 2 (two) times daily. Take all of this medication    Dispense:  20 tablet    Refill:  0  . meclizine (ANTIVERT) 25 MG tablet    Sig: Take 1 tablet (25 mg total) by mouth 4 (four) times daily as needed for dizziness.    Dispense:  50 tablet    Refill:  1     Follow-up: No Follow-up on file.  Mechele ClaudeWarren Layla Kesling, M.D.

## 2015-11-22 ENCOUNTER — Encounter: Payer: Self-pay | Admitting: Nurse Practitioner

## 2015-11-22 ENCOUNTER — Ambulatory Visit (INDEPENDENT_AMBULATORY_CARE_PROVIDER_SITE_OTHER): Payer: Medicare Other | Admitting: Nurse Practitioner

## 2015-11-22 VITALS — BP 158/88 | HR 71 | Temp 96.8°F | Ht 61.0 in | Wt 130.0 lb

## 2015-11-22 DIAGNOSIS — N183 Chronic kidney disease, stage 3 unspecified: Secondary | ICD-10-CM

## 2015-11-22 DIAGNOSIS — F17200 Nicotine dependence, unspecified, uncomplicated: Secondary | ICD-10-CM

## 2015-11-22 DIAGNOSIS — I10 Essential (primary) hypertension: Secondary | ICD-10-CM

## 2015-11-22 DIAGNOSIS — E119 Type 2 diabetes mellitus without complications: Secondary | ICD-10-CM

## 2015-11-22 DIAGNOSIS — E034 Atrophy of thyroid (acquired): Secondary | ICD-10-CM

## 2015-11-22 DIAGNOSIS — E785 Hyperlipidemia, unspecified: Secondary | ICD-10-CM | POA: Diagnosis not present

## 2015-11-22 LAB — BAYER DCA HB A1C WAIVED: HB A1C (BAYER DCA - WAIVED): 5.5 % (ref <7.0)

## 2015-11-22 MED ORDER — AMLODIPINE BESYLATE 10 MG PO TABS: 10.0000 mg | ORAL_TABLET | Freq: Every day | ORAL | 3 refills | Status: DC

## 2015-11-22 NOTE — Progress Notes (Signed)
Subjective:    Patient ID: Michele Walters, female    DOB: 10-25-1935, 81 y.o.   MRN: [id number]   HPI:  Patient here today for follow up of chronic medical problems.  Outpatient Encounter Prescriptions as of 07/11/2015  Medication Sig  . amLODipine (NORVASC) 5 MG tablet Take 0.5 tablets (2.5 mg total) by mouth daily.  Marland Kitchen aspirin 81 MG tablet Take 81 mg by mouth daily.  Marland Kitchen atenolol (TENORMIN) 50 MG tablet TAKE 1 TABLET (50 MG TOTAL) BY MOUTH DAILY.  Marland Kitchen atorvastatin (LIPITOR) 40 MG tablet TAKE 1 TABLET (40 MG TOTAL) BY MOUTH DAILY.  . calcium carbonate (OS-CAL) 600 MG TABS tablet Take 600 mg by mouth daily.  . [facility] Lancets Ultra Thin MISC Check blood sugar daily and PRN  . levothyroxine (SYNTHROID, LEVOTHROID) 88 MCG tablet TAKE 1 TABLET (88 MCG TOTAL) BY MOUTH DAILY BEFORE BREAKFAST.  . metFORMIN (GLUCOPHAGE-XR) 500 MG 24 hr tablet TAKE 1 TABLET (500 MG TOTAL) BY MOUTH DAILY WITH BREAKFAST.  Marland Kitchen Omega-3 Fatty Acids (FISH OIL) 1000 MG CAPS Take 1 capsule by mouth daily.  Michele Walters test strip TEST ONCE A DAY              Hyperlipidemia  This is a chronic problem. The current episode started more than 1 year ago. The problem is controlled. Exacerbating diseases include diabetes. She has no history of hypothyroidism. Pertinent negatives include no chest pain or shortness of breath. Current antihyperlipidemic treatment includes statins. The current treatment provides moderate improvement of lipids. Compliance problems include adherence to diet and adherence to exercise.  Risk factors for coronary artery disease include diabetes mellitus, dyslipidemia, hypertension and obesity.  Thyroid Problem  Visit type: hypothyroidism. Patient reports no constipation, diaphoresis, diarrhea, heat intolerance, menstrual problem or palpitations. The symptoms have been stable. Her past medical history is significant for diabetes and hyperlipidemia.  Hypertension  This is a chronic problem. The current episode  started more than 1 year ago. The problem is controlled. Pertinent negatives include no chest pain, headaches, neck pain, palpitations or shortness of breath. Risk factors for coronary artery disease include dyslipidemia, diabetes mellitus, obesity and post-menopausal state. Past treatments include beta blockers. The current treatment provides moderate improvement. Compliance problems include diet and exercise.  Hypertensive end-organ damage includes a thyroid problem.  Diabetes  She presents for her follow-up diabetic visit. She has type 2 diabetes mellitus. No MedicAlert identification noted. There are no hypoglycemic associated symptoms. Pertinent negatives for hypoglycemia include no headaches. Pertinent negatives for diabetes include no chest pain. There are no hypoglycemic complications. Symptoms are stable. Risk factors for coronary artery disease include tobacco exposure, hypertension and dyslipidemia. Her weight is stable. When asked about meal planning, she reported none. She has not had a previous visit with a dietitian. She rarely participates in exercise. Her breakfast blood glucose is taken between 8-9 am. Her breakfast blood glucose range is generally 110-130 mg/dl. Her highest blood glucose is >200 mg/dl. Her overall blood glucose range is 130-140 mg/dl. An ACE inhibitor/angiotensin II receptor blocker is not being taken. She does not see a podiatrist.Eye exam is not current.     Review of Systems  Constitutional: Negative for diaphoresis.  HENT: Positive for hearing loss.   Respiratory: Negative for shortness of breath.   Cardiovascular: Negative for chest pain and palpitations.  Gastrointestinal: Negative for constipation and diarrhea.  Endocrine: Negative for heat intolerance.  Genitourinary: Negative for menstrual problem.  Musculoskeletal: Negative for neck pain.  Neurological:  Negative for headaches.       Objective:   Physical Exam  Constitutional: She is oriented to  person, place, and time. She appears well-developed and well-nourished.  HENT:  Nose: Nose normal.  Mouth/Throat: Oropharynx is clear and moist.  Eyes: EOM are normal.  Neck: Trachea normal, normal range of motion and full passive range of motion without pain. Neck supple. No JVD present. Carotid bruit is not present. No thyromegaly present.  Cardiovascular: Normal rate, regular rhythm, normal heart sounds and intact distal pulses.  Exam reveals no gallop and no friction rub.   No murmur heard. Pulmonary/Chest: Effort normal and breath sounds normal.  Abdominal: Soft. Bowel sounds are normal. She exhibits no distension and no mass. There is no tenderness.  Musculoskeletal: Normal range of motion.  Lymphadenopathy:    She has no cervical adenopathy.  Neurological: She is alert and oriented to person, place, and time. She has normal reflexes.  Skin: Skin is warm and dry.  Psychiatric: She has a normal mood and affect. Her behavior is normal. Judgment and thought content normal.   BP (!) 158/88 (BP Location: Left Arm, Cuff Size: Normal)   Pulse 71   Temp (!) 96.8 F (36 C) (Oral)   Ht 5' 1"  (1.549 m)   Wt 130 lb (59 kg)   BMI 24.56 kg/m      HgbA1c 5.5% down from 5.9% at last visit     Assessment & Plan:  1. Type 2 diabetes mellitus without complication, without long-term current use of insulin (HCC) Continue low carb diet - Bayer DCA Hb A1c Waived  2. Hyperlipidemia, unspecified hyperlipidemia type Low fat diet encouraged - Lipid panel  3. Essential hypertension Low salt diet, do not add extra slat to meal Increased norvasc from 81m to 156mdaily - CMP14+EGFR - amLODipine (NORVASC) 10 MG tablet; Take 1 tablet (10 mg total) by mouth daily.  Dispense: 90 tablet; Refill: 3  4. Smoker Smoking cessation education provided  5. Kidney disease, chronic, stage III (moderate, EGFR 30-59 ml/min) Will continue to watch with lab work  6. Hypothyroidism due to acquired atrophy of  thyroid     Labs pending Health maintenance reviewed Diet and exercise encouraged Continue all meds Follow up  in 3 months   QuJari FavreRN, NP Student  MaChevis PrettyFNP

## 2015-11-22 NOTE — Patient Instructions (Signed)
Smoking Cessation, Tips for Success If you are ready to quit smoking, congratulations! You have chosen to help yourself be healthier. Cigarettes bring nicotine, tar, carbon monoxide, and other irritants into your body. Your lungs, heart, and blood vessels will be able to work better without these poisons. There are many different ways to quit smoking. Nicotine gum, nicotine patches, a nicotine inhaler, or nicotine nasal spray can help with physical craving. Hypnosis, support groups, and medicines help break the habit of smoking. WHAT THINGS CAN I DO TO MAKE QUITTING EASIER?  Here are some tips to help you quit for good:  Pick a date when you will quit smoking completely. Tell all of your friends and family about your plan to quit on that date.  Do not try to slowly cut down on the number of cigarettes you are smoking. Pick a quit date and quit smoking completely starting on that day.  Throw away all cigarettes.   Clean and remove all ashtrays from your home, work, and car.  On a card, write down your reasons for quitting. Carry the card with you and read it when you get the urge to smoke.  Cleanse your body of nicotine. Drink enough water and fluids to keep your urine clear or pale yellow. Do this after quitting to flush the nicotine from your body.  Learn to predict your moods. Do not let a bad situation be your excuse to have a cigarette. Some situations in your life might tempt you into wanting a cigarette.  Never have "just one" cigarette. It leads to wanting another and another. Remind yourself of your decision to quit.  Change habits associated with smoking. If you smoked while driving or when feeling stressed, try other activities to replace smoking. Stand up when drinking your coffee. Brush your teeth after eating. Sit in a different chair when you read the paper. Avoid alcohol while trying to quit, and try to drink fewer caffeinated beverages. Alcohol and caffeine may urge you to  smoke.  Avoid foods and drinks that can trigger a desire to smoke, such as sugary or spicy foods and alcohol.  Ask people who smoke not to smoke around you.  Have something planned to do right after eating or having a cup of coffee. For example, plan to take a walk or exercise.  Try a relaxation exercise to calm you down and decrease your stress. Remember, you may be tense and nervous for the first 2 weeks after you quit, but this will pass.  Find new activities to keep your hands busy. Play with a pen, coin, or rubber band. Doodle or draw things on paper.  Brush your teeth right after eating. This will help cut down on the craving for the taste of tobacco after meals. You can also try mouthwash.   Use oral substitutes in place of cigarettes. Try using lemon drops, carrots, cinnamon sticks, or chewing gum. Keep them handy so they are available when you have the urge to smoke.  When you have the urge to smoke, try deep breathing.  Designate your home as a nonsmoking area.  If you are a heavy smoker, ask your health care provider about a prescription for nicotine chewing gum. It can ease your withdrawal from nicotine.  Reward yourself. Set aside the cigarette money you save and buy yourself something nice.  Look for support from others. Join a support group or smoking cessation program. Ask someone at home or at work to help you with your plan   to quit smoking.  Always ask yourself, "Do I need this cigarette or is this just a reflex?" Tell yourself, "Today, I choose not to smoke," or "I do not want to smoke." You are reminding yourself of your decision to quit.  Do not replace cigarette smoking with electronic cigarettes (commonly called e-cigarettes). The safety of e-cigarettes is unknown, and some may contain harmful chemicals.  If you relapse, do not give up! Plan ahead and think about what you will do the next time you get the urge to smoke. HOW WILL I FEEL WHEN I QUIT SMOKING? You  may have symptoms of withdrawal because your body is used to nicotine (the addictive substance in cigarettes). You may crave cigarettes, be irritable, feel very hungry, cough often, get headaches, or have difficulty concentrating. The withdrawal symptoms are only temporary. They are strongest when you first quit but will go away within 10-14 days. When withdrawal symptoms occur, stay in control. Think about your reasons for quitting. Remind yourself that these are signs that your body is healing and getting used to being without cigarettes. Remember that withdrawal symptoms are easier to treat than the major diseases that smoking can cause.  Even after the withdrawal is over, expect periodic urges to smoke. However, these cravings are generally short lived and will go away whether you smoke or not. Do not smoke! WHAT RESOURCES ARE AVAILABLE TO HELP ME QUIT SMOKING? Your health care provider can direct you to community resources or hospitals for support, which may include:  Group support.  Education.  Hypnosis.  Therapy.   This information is not intended to replace advice given to you by your health care provider. Make sure you discuss any questions you have with your health care provider.   Document Released: 10/21/2003 Document Revised: 02/12/2014 Document Reviewed: 07/10/2012 Elsevier Interactive Patient Education 2016 [employer]  

## 2015-11-23 LAB — CMP14+EGFR
ALK PHOS: 84 IU/L (ref 39–117)
ALT: 5 IU/L (ref 0–32)
AST: 14 IU/L (ref 0–40)
Albumin/Globulin Ratio: 1.2 (ref 1.2–2.2)
Albumin: 4.3 g/dL (ref 3.5–4.8)
BUN/Creatinine Ratio: 22 (ref 12–28)
BUN: 35 mg/dL — AB (ref 8–27)
Bilirubin Total: 0.5 mg/dL (ref 0.0–1.2)
CHLORIDE: 100 mmol/L (ref 96–106)
CO2: 23 mmol/L (ref 18–29)
CREATININE: 1.61 mg/dL — AB (ref 0.57–1.00)
Calcium: 9.7 mg/dL (ref 8.7–10.3)
GFR calc Af Amer: 35 mL/min/{1.73_m2} — ABNORMAL LOW (ref >59)
GFR calc non Af Amer: 30 mL/min/{1.73_m2} — ABNORMAL LOW (ref >59)
GLUCOSE: 109 mg/dL — AB (ref 65–99)
Globulin, Total: 3.5 g/dL (ref 1.5–4.5)
Potassium: 4.6 mmol/L (ref 3.5–5.2)
SODIUM: 139 mmol/L (ref 134–144)
Total Protein: 7.8 g/dL (ref 6.0–8.5)

## 2015-11-23 LAB — LIPID PANEL
CHOLESTEROL TOTAL: 132 mg/dL (ref 100–199)
Chol/HDL Ratio: 3.3 ratio units (ref 0.0–4.4)
HDL: 40 mg/dL (ref >39)
LDL CALC: 64 mg/dL (ref 0–99)
TRIGLYCERIDES: 138 mg/dL (ref 0–149)
VLDL CHOLESTEROL CAL: 28 mg/dL (ref 5–40)

## 2015-12-06 ENCOUNTER — Ambulatory Visit: Payer: Self-pay | Admitting: Nurse Practitioner

## 2016-01-21 ENCOUNTER — Other Ambulatory Visit: Payer: Self-pay | Admitting: Nurse Practitioner

## 2016-02-02 ENCOUNTER — Other Ambulatory Visit: Payer: Self-pay | Admitting: Pharmacist

## 2016-02-02 ENCOUNTER — Other Ambulatory Visit: Payer: Self-pay | Admitting: Nurse Practitioner

## 2016-02-02 DIAGNOSIS — E785 Hyperlipidemia, unspecified: Secondary | ICD-10-CM

## 2016-02-02 DIAGNOSIS — I10 Essential (primary) hypertension: Secondary | ICD-10-CM

## 2016-02-02 DIAGNOSIS — E119 Type 2 diabetes mellitus without complications: Secondary | ICD-10-CM

## 2016-02-03 ENCOUNTER — Other Ambulatory Visit: Payer: Self-pay | Admitting: Nurse Practitioner

## 2016-02-03 DIAGNOSIS — E034 Atrophy of thyroid (acquired): Secondary | ICD-10-CM

## 2016-03-17 ENCOUNTER — Other Ambulatory Visit: Payer: Self-pay | Admitting: Nurse Practitioner

## 2016-03-17 DIAGNOSIS — I10 Essential (primary) hypertension: Secondary | ICD-10-CM

## 2016-03-23 ENCOUNTER — Other Ambulatory Visit: Payer: Self-pay | Admitting: Nurse Practitioner

## 2016-03-23 DIAGNOSIS — I10 Essential (primary) hypertension: Secondary | ICD-10-CM

## 2016-04-23 ENCOUNTER — Ambulatory Visit (INDEPENDENT_AMBULATORY_CARE_PROVIDER_SITE_OTHER): Payer: Medicare Other | Admitting: Nurse Practitioner

## 2016-04-23 ENCOUNTER — Encounter: Payer: Self-pay | Admitting: Nurse Practitioner

## 2016-04-23 VITALS — BP 155/86 | HR 73 | Temp 96.8°F | Ht 61.0 in | Wt 120.0 lb

## 2016-04-23 DIAGNOSIS — E785 Hyperlipidemia, unspecified: Secondary | ICD-10-CM | POA: Diagnosis not present

## 2016-04-23 DIAGNOSIS — R42 Dizziness and giddiness: Secondary | ICD-10-CM

## 2016-04-23 DIAGNOSIS — E782 Mixed hyperlipidemia: Secondary | ICD-10-CM | POA: Diagnosis not present

## 2016-04-23 DIAGNOSIS — I10 Essential (primary) hypertension: Secondary | ICD-10-CM | POA: Diagnosis not present

## 2016-04-23 DIAGNOSIS — N183 Chronic kidney disease, stage 3 unspecified: Secondary | ICD-10-CM

## 2016-04-23 DIAGNOSIS — F17200 Nicotine dependence, unspecified, uncomplicated: Secondary | ICD-10-CM | POA: Diagnosis not present

## 2016-04-23 DIAGNOSIS — E119 Type 2 diabetes mellitus without complications: Secondary | ICD-10-CM

## 2016-04-23 DIAGNOSIS — E034 Atrophy of thyroid (acquired): Secondary | ICD-10-CM

## 2016-04-23 LAB — BAYER DCA HB A1C WAIVED: HB A1C: 5.1 % (ref <7.0)

## 2016-04-23 MED ORDER — ATORVASTATIN CALCIUM 40 MG PO TABS: 40.0000 mg | ORAL_TABLET | Freq: Every day | ORAL | 1 refills | Status: DC

## 2016-04-23 MED ORDER — ATENOLOL 50 MG PO TABS: 50.0000 mg | ORAL_TABLET | Freq: Every day | ORAL | 1 refills | Status: DC

## 2016-04-23 MED ORDER — METFORMIN HCL ER 500 MG PO TB24: ORAL_TABLET | ORAL | 1 refills | Status: DC

## 2016-04-23 MED ORDER — AMLODIPINE BESYLATE 5 MG PO TABS: 5.0000 mg | ORAL_TABLET | Freq: Every day | ORAL | 0 refills | Status: DC

## 2016-04-23 MED ORDER — LEVOTHYROXINE SODIUM 88 MCG PO TABS: 88.0000 ug | ORAL_TABLET | Freq: Every day | ORAL | 2 refills | Status: AC

## 2016-04-23 NOTE — Progress Notes (Signed)
Subjective:    Patient ID: Michele Walters, female    DOB: 03-19-1935, 81 y.o.   MRN: [id number]   HPI:  Patient here today for follow up of chronic medical problems. No chnages to medication since last visit.   Outpatient Encounter Prescriptions as of 07/11/2015  Medication Sig  . amLODipine (NORVASC) 5 MG tablet Take 0.5 tablets (2.5 mg total) by mouth daily.  Marland Kitchen aspirin 81 MG tablet Take 81 mg by mouth daily.  Marland Kitchen atenolol (TENORMIN) 50 MG tablet TAKE 1 TABLET (50 MG TOTAL) BY MOUTH DAILY.  Marland Kitchen atorvastatin (LIPITOR) 40 MG tablet TAKE 1 TABLET (40 MG TOTAL) BY MOUTH DAILY.  . calcium carbonate (OS-CAL) 600 MG TABS tablet Take 600 mg by mouth daily.  . [facility] Lancets Ultra Thin MISC Check blood sugar daily and PRN  . levothyroxine (SYNTHROID, LEVOTHROID) 88 MCG tablet TAKE 1 TABLET (88 MCG TOTAL) BY MOUTH DAILY BEFORE BREAKFAST.  . metFORMIN (GLUCOPHAGE-XR) 500 MG 24 hr tablet TAKE 1 TABLET (500 MG TOTAL) BY MOUTH DAILY WITH BREAKFAST.  Marland Kitchen Omega-3 Fatty Acids (FISH OIL) 1000 MG CAPS Take 1 capsule by mouth daily.  Glory Rosebush VERIO test strip TEST ONCE A DAY           * she is c/o of dizziness that has been going on intermittently for several days. Lsight nausea with it at times. Has meclizine at home which has helped.   Hyperlipidemia  This is a chronic problem. The current episode started more than 1 year ago. The problem is controlled. Exacerbating diseases include diabetes. She has no history of hypothyroidism. Pertinent negatives include no chest pain or shortness of breath. Current antihyperlipidemic treatment includes statins. The current treatment provides moderate improvement of lipids. Compliance problems include adherence to diet and adherence to exercise.  Risk factors for coronary artery disease include diabetes mellitus, dyslipidemia, hypertension and obesity.  Thyroid Problem  Visit type: hypothyroidism. Patient reports no constipation, diaphoresis, diarrhea, heat intolerance,  menstrual problem or palpitations. The symptoms have been stable. Her past medical history is significant for diabetes and hyperlipidemia.  Hypertension  This is a chronic problem. The current episode started more than 1 year ago. The problem is controlled. Pertinent negatives include no chest pain, headaches, neck pain, palpitations or shortness of breath. Risk factors for coronary artery disease include dyslipidemia, diabetes mellitus, obesity and post-menopausal state. Past treatments include beta blockers. The current treatment provides moderate improvement. Compliance problems include diet and exercise.  Identifiable causes of hypertension include a thyroid problem.  Diabetes  She presents for her follow-up diabetic visit. She has type 2 diabetes mellitus. No MedicAlert identification noted. There are no hypoglycemic associated symptoms. Pertinent negatives for hypoglycemia include no headaches. Pertinent negatives for diabetes include no chest pain. There are no hypoglycemic complications. Symptoms are stable. Risk factors for coronary artery disease include tobacco exposure, hypertension and dyslipidemia. Her weight is stable. When asked about meal planning, she reported none. She has not had a previous visit with a dietitian. She rarely participates in exercise. Her breakfast blood glucose is taken between 8-9 am. Her breakfast blood glucose range is generally 110-130 mg/dl. Her highest blood glucose is >200 mg/dl. Her overall blood glucose range is 130-140 mg/dl. An ACE inhibitor/angiotensin II receptor blocker is not being taken. She does not see a podiatrist.Eye exam is not current.     Review of Systems  Constitutional: Negative for diaphoresis.  HENT: Positive for hearing loss.   Respiratory: Negative for shortness of breath.  Cardiovascular: Negative for chest pain and palpitations.  Gastrointestinal: Negative for constipation and diarrhea.  Endocrine: Negative for heat intolerance.   Genitourinary: Negative for menstrual problem.  Musculoskeletal: Negative for neck pain.  Neurological: Negative for headaches.       Objective:   Physical Exam  Constitutional: She is oriented to person, place, and time. She appears well-developed and well-nourished.  HENT:  Nose: Nose normal.  Mouth/Throat: Oropharynx is clear and moist.  Eyes: EOM are normal.  Neck: Trachea normal, normal range of motion and full passive range of motion without pain. Neck supple. No JVD present. Carotid bruit is not present. No thyromegaly present.  Cardiovascular: Normal rate, regular rhythm, normal heart sounds and intact distal pulses.  Exam reveals no gallop and no friction rub.   No murmur heard. Pulmonary/Chest: Effort normal and breath sounds normal.  Abdominal: Soft. Bowel sounds are normal. She exhibits no distension and no mass. There is no tenderness.  Musculoskeletal: Normal range of motion.  Lymphadenopathy:    She has no cervical adenopathy.  Neurological: She is alert and oriented to person, place, and time. She has normal reflexes.  Skin: Skin is warm and dry.  Psychiatric: She has a normal mood and affect. Her behavior is normal. Judgment and thought content normal.   BP (!) 155/86   Pulse 73   Temp (!) 96.8 F (36 C) (Oral)   Ht 5' 1"  (1.549 m)   Wt 120 lb (54.4 kg)   BMI 22.67 kg/m      HgbA1c 5.1% down from 5.5% at last visit     Assessment & Plan:  1. Type 2 diabetes mellitus without complication, without long-term current use of insulin (HCC) Continue to wathc arbs in diet - Bayer DCA Hb A1c Waived - metFORMIN (GLUCOPHAGE-XR) 500 MG 24 hr tablet; TAKE 1 TABLET (500 MG TOTAL) BY MOUTH DAILY WITH BEAKFAST.  Dispense: 90 tablet; Refill: 1  2. Hyperlipidemia, unspecified hyperlipidemia type Low fat diet - Lipid panel - lipitor as rx  3. Essential hypertension Low sodium diet - CMP14+EGFR - amLODipine (NORVASC) 5 MG tablet; Take 1 tablet (5 mg total) by mouth  daily.  Dispense: 90 tablet; Refill: 0 - atenolol (TENORMIN) 50 MG tablet; Take 1 tablet (50 mg total) by mouth daily.  Dispense: 90 tablet; Refill: 1  4. Hypothyroidism due to acquired atrophy of thyroid - levothyroxine (SYNTHROID, LEVOTHROID) 88 MCG tablet; Take 1 tablet (88 mcg total) by mouth daily before breakfast.  Dispense: 90 tablet; Refill: 2  5. Kidney disease, chronic, stage III (moderate, EGFR 30-59 ml/min)  6. Smoker Smoking cessation encouraged  7. Vertigo Continue meclizine as needed Force fluids    Labs pending Health maintenance reviewed Diet and exercise encouraged Continue all meds Follow up  In 6 months   [place], FNP

## 2016-04-23 NOTE — Patient Instructions (Signed)
Fall Prevention in the Home Falls can cause injuries. They can happen to people of all ages. There are many things you can do to make your home safe and to help prevent falls. What can I do on the outside of my home?  Regularly fix the edges of walkways and driveways and fix any cracks.  Remove anything that might make you trip as you walk through a door, such as a raised step or threshold.  Trim any bushes or trees on the path to your home.  Use bright outdoor lighting.  Clear any walking paths of anything that might make someone trip, such as rocks or tools.  Regularly check to see if handrails are loose or broken. Make sure that both sides of any steps have handrails.  Any raised decks and porches should have guardrails on the edges.  Have any leaves, snow, or ice cleared regularly.  Use sand or salt on walking paths during winter.  Clean up any spills in your garage right away. This includes oil or grease spills. What can I do in the bathroom?  Use night lights.  Install grab bars by the toilet and in the tub and shower. Do not use towel bars as grab bars.  Use non-skid mats or decals in the tub or shower.  If you need to sit down in the shower, use a plastic, non-slip stool.  Keep the floor dry. Clean up any water that spills on the floor as soon as it happens.  Remove soap buildup in the tub or shower regularly.  Attach bath mats securely with double-sided non-slip rug tape.  Do not have throw rugs and other things on the floor that can make you trip. What can I do in the bedroom?  Use night lights.  Make sure that you have a light by your bed that is easy to reach.  Do not use any sheets or blankets that are too big for your bed. They should not hang down onto the floor.  Have a firm chair that has side arms. You can use this for support while you get dressed.  Do not have throw rugs and other things on the floor that can make you trip. What can I do in the  kitchen?  Clean up any spills right away.  Avoid walking on wet floors.  Keep items that you use a lot in easy-to-reach places.  If you need to reach something above you, use a strong step stool that has a grab bar.  Keep electrical cords out of the way.  Do not use floor polish or wax that makes floors slippery. If you must use wax, use non-skid floor wax.  Do not have throw rugs and other things on the floor that can make you trip. What can I do with my stairs?  Do not leave any items on the stairs.  Make sure that there are handrails on both sides of the stairs and use them. Fix handrails that are broken or loose. Make sure that handrails are as long as the stairways.  Check any carpeting to make sure that it is firmly attached to the stairs. Fix any carpet that is loose or worn.  Avoid having throw rugs at the top or bottom of the stairs. If you do have throw rugs, attach them to the floor with carpet tape.  Make sure that you have a light switch at the top of the stairs and the bottom of the stairs. If you do   not have them, ask someone to add them for you. What else can I do to help prevent falls?  Wear shoes that:  Do not have high heels.  Have rubber bottoms.  Are comfortable and fit you well.  Are closed at the toe. Do not wear sandals.  If you use a stepladder:  Make sure that it is fully opened. Do not climb a closed stepladder.  Make sure that both sides of the stepladder are locked into place.  Ask someone to hold it for you, if possible.  Clearly mark and make sure that you can see:  Any grab bars or handrails.  First and last steps.  Where the edge of each step is.  Use tools that help you move around (mobility aids) if they are needed. These include:  Canes.  Walkers.  Scooters.  Crutches.  Turn on the lights when you go into a dark area. Replace any light bulbs as soon as they burn out.  Set up your furniture so you have a clear path.  Avoid moving your furniture around.  If any of your floors are uneven, fix them.  If there are any pets around you, be aware of where they are.  Review your medicines with your doctor. Some medicines can make you feel dizzy. This can increase your chance of falling. Ask your doctor what other things that you can do to help prevent falls. This information is not intended to replace advice given to you by your health care provider. Make sure you discuss any questions you have with your health care provider. Document Released: 11/18/2008 Document Revised: 06/30/2015 Document Reviewed: 02/26/2014 Elsevier Interactive Patient Education  2017 Elsevier Inc.  

## 2016-04-24 LAB — CMP14+EGFR
A/G RATIO: 1.4 (ref 1.2–2.2)
ALK PHOS: 76 IU/L (ref 39–117)
ALT: 8 IU/L (ref 0–32)
AST: 18 IU/L (ref 0–40)
Albumin: 4 g/dL (ref 3.5–4.8)
BILIRUBIN TOTAL: 0.6 mg/dL (ref 0.0–1.2)
BUN/Creatinine Ratio: 18 (ref 12–28)
BUN: 27 mg/dL (ref 8–27)
CHLORIDE: 102 mmol/L (ref 96–106)
CO2: 22 mmol/L (ref 18–29)
Calcium: 9.2 mg/dL (ref 8.7–10.3)
Creatinine, Ser: 1.47 mg/dL — ABNORMAL HIGH (ref 0.57–1.00)
GFR calc Af Amer: 39 mL/min/{1.73_m2} — ABNORMAL LOW (ref >59)
GFR calc non Af Amer: 34 mL/min/{1.73_m2} — ABNORMAL LOW (ref >59)
GLUCOSE: 110 mg/dL — AB (ref 65–99)
Globulin, Total: 2.9 g/dL (ref 1.5–4.5)
POTASSIUM: 4.8 mmol/L (ref 3.5–5.2)
Sodium: 141 mmol/L (ref 134–144)
Total Protein: 6.9 g/dL (ref 6.0–8.5)

## 2016-04-24 LAB — LIPID PANEL
CHOLESTEROL TOTAL: 129 mg/dL (ref 100–199)
Chol/HDL Ratio: 2.9 ratio units (ref 0.0–4.4)
HDL: 45 mg/dL (ref >39)
LDL Calculated: 62 mg/dL (ref 0–99)
Triglycerides: 108 mg/dL (ref 0–149)
VLDL Cholesterol Cal: 22 mg/dL (ref 5–40)

## 2016-05-02 ENCOUNTER — Other Ambulatory Visit: Payer: Self-pay | Admitting: Nurse Practitioner

## 2016-05-02 DIAGNOSIS — I10 Essential (primary) hypertension: Secondary | ICD-10-CM

## 2016-05-02 DIAGNOSIS — E119 Type 2 diabetes mellitus without complications: Secondary | ICD-10-CM

## 2016-05-02 DIAGNOSIS — E785 Hyperlipidemia, unspecified: Secondary | ICD-10-CM

## 2016-05-07 ENCOUNTER — Ambulatory Visit (INDEPENDENT_AMBULATORY_CARE_PROVIDER_SITE_OTHER): Payer: Medicare Other | Admitting: Family

## 2016-05-07 ENCOUNTER — Encounter: Payer: Self-pay | Admitting: Family

## 2016-05-07 VITALS — BP 137/77 | HR 70 | Temp 96.8°F | Ht 61.0 in | Wt 122.0 lb

## 2016-05-07 DIAGNOSIS — F17200 Nicotine dependence, unspecified, uncomplicated: Secondary | ICD-10-CM

## 2016-05-07 DIAGNOSIS — H8110 Benign paroxysmal vertigo, unspecified ear: Secondary | ICD-10-CM

## 2016-05-07 MED ORDER — MECLIZINE HCL 25 MG PO TABS: 25.0000 mg | ORAL_TABLET | Freq: Four times a day (QID) | ORAL | 1 refills | Status: DC | PRN

## 2016-05-07 NOTE — Progress Notes (Signed)
   Subjective:    Patient ID: Michele Walters, female    DOB: 1935/08/11, 81 y.o.   MRN: [id number]  Dizziness  This is a new problem. The current episode started 1 to 4 weeks ago. The problem occurs intermittently. The problem has been waxing and waning. Associated symptoms include coughing, headaches and weakness. Pertinent negatives include no nausea or vomiting. The symptoms are aggravated by bending. Treatments tried: antivert. The treatment provided mild relief.   Pt fell on Tuesday outside on her home. Pt states she landed on her chin, knees, and hands. Pt has bruising, but denies any pain at this time.    Review of Systems  Respiratory: Positive for cough.   Gastrointestinal: Negative for nausea and vomiting.  Neurological: Positive for dizziness, weakness and headaches.  All other systems reviewed and are negative.      Objective:   Physical Exam  Constitutional: She is oriented to person, place, and time. She appears well-developed and well-nourished. No distress.  HENT:  Head: Normocephalic and atraumatic.  Right Ear: External ear normal.  Left Ear: External ear normal.  Mouth/Throat: Posterior oropharyngeal erythema present.  Eyes: Pupils are equal, round, and reactive to light.  Neck: Normal range of motion. Neck supple. No thyromegaly present.  Cardiovascular: Normal rate, regular rhythm, normal heart sounds and intact distal pulses.   No murmur heard. Pulmonary/Chest: Effort normal and breath sounds normal. No respiratory distress. She has no wheezes.  Abdominal: Soft. Bowel sounds are normal. She exhibits no distension. There is no tenderness.  Musculoskeletal: Normal range of motion. She exhibits no edema or tenderness.  Neurological: She is alert and oriented to person, place, and time. She has normal reflexes. No cranial nerve deficit.  Skin: Skin is warm and dry. Ecchymosis (chin) noted.  Psychiatric: She has a normal mood and affect. Her behavior is normal.  Judgment and thought content normal.  Vitals reviewed.     BP 137/77   Pulse 70   Temp (!) 96.8 F (36 C) (Oral)   Ht  (1.549 m)   Wt 122 lb (55.3 kg)   BMI 23.05 kg/m      Assessment & Plan:  1. Benign paroxysmal positional vertigo, unspecified laterality -Falls precaution discussed -Take Antivert as needed -[place] exercises discussed- Handout given RTO prn, pt states her vertigo improved - meclizine (ANTIVERT) 25 MG tablet; Take 1 tablet (25 mg total) by mouth 4 (four) times daily as needed for dizziness.  Dispense: 50 tablet; Refill: 1  2. Smoker -Smoking cessation discussed    Jannifer Rodney, FNP

## 2016-05-07 NOTE — Patient Instructions (Signed)

## 2016-07-10 ENCOUNTER — Encounter: Payer: Self-pay | Admitting: Physician Assistant

## 2016-07-10 ENCOUNTER — Ambulatory Visit (INDEPENDENT_AMBULATORY_CARE_PROVIDER_SITE_OTHER): Payer: Medicare Other | Admitting: Physician Assistant

## 2016-07-10 VITALS — BP 162/85 | HR 78 | Temp 96.9°F | Ht 61.0 in | Wt 123.8 lb

## 2016-07-10 DIAGNOSIS — M545 Low back pain, unspecified: Secondary | ICD-10-CM

## 2016-07-10 MED ORDER — IBUPROFEN 600 MG PO TABS: 600.0000 mg | ORAL_TABLET | Freq: Two times a day (BID) | ORAL | 0 refills | Status: DC

## 2016-07-10 NOTE — Progress Notes (Signed)
BP (!) 162/85   Pulse 78   Temp (!) 96.9 F (36.1 C) (Oral)   Ht 5\' 1"  (1.549 m)   Wt 123 lb 12.8 oz (56.2 kg)   BMI 23.39 kg/m    Subjective:    Patient ID: Michele Walters, female    DOB: 10/08/1935, 81 y.o.   MRN: [id number]  HPI: Michele Walters is a 81 y.o. female presenting on 07/10/2016 for Back Pain (3-4 days, no pain when urinating, sometimes stumbles getting up to go to bathroom at night) and Chest Pain (sometimes not very often at night, lives by self, is very active, walks. fx history - mom aneurysm, denies needing ekg  usually eats about 4-5 pm,  pains usually don't last long, usually when she last longs)  She denies any GERD or difficulty with swallowing. She has not had any change in her bowel movements. The pain is primarily on the lower thoracic upper lumbar area to the right. It is not affected her ability to move and walk. She has taken a little bit of over-the-counter medication without any relief.  This is been going on for about week.  Relevant past medical, surgical, family and social history reviewed and updated as indicated. Allergies and medications reviewed and updated.  Past Medical History:  Diagnosis Date  . Allergy   . Cataract   . Diabetes mellitus without complication (HCC)   . Hyperlipidemia   . Hypertension   . Thyroid disease     Past Surgical History:  Procedure Laterality Date  . ABDOMINAL HYSTERECTOMY     partial but later ovaries removed  . EYE SURGERY Bilateral    cataract  . OOPHORECTOMY Bilateral     Review of Systems  Constitutional: Negative.   HENT: Negative.   Eyes: Negative.   Respiratory: Negative.   Gastrointestinal: Negative.   Genitourinary: Negative.   Musculoskeletal: Positive for arthralgias, back pain and myalgias. Negative for gait problem and joint swelling.    Allergies as of 07/10/2016   No Known Allergies     Medication List       Accurate as of 07/10/16 11:10 AM. Always use your most recent med list.            amLODipine 5 MG tablet Commonly known as:  NORVASC TAKE 1 TABLET (5 MG TOTAL) BY MOUTH DAILY.   aspirin 81 MG tablet Take 81 mg by mouth daily.   atenolol 50 MG tablet Commonly known as:  TENORMIN Take 1 tablet (50 mg total) by mouth daily.   atorvastatin 40 MG tablet Commonly known as:  LIPITOR Take 1 tablet (40 mg total) by mouth daily at 6 PM.   calcium carbonate 600 MG Tabs tablet Commonly known as:  OS-CAL Take 600 mg by mouth daily.   Fish Oil 1000 MG Caps Take 1 capsule by mouth daily.   ibuprofen 600 MG tablet Commonly known as:  ADVIL,MOTRIN Take 1 tablet (600 mg total) by mouth 2 (two) times daily.   levothyroxine 88 MCG tablet Commonly known as:  SYNTHROID, LEVOTHROID Take 1 tablet (88 mcg total) by mouth daily before breakfast.   meclizine 25 MG tablet Commonly known as:  ANTIVERT Take 1 tablet (25 mg total) by mouth 4 (four) times daily as needed for dizziness.   metFORMIN 500 MG 24 hr tablet Commonly known as:  GLUCOPHAGE-XR TAKE 1 TABLET (500 MG TOTAL) BY MOUTH DAILY WITH BREAKFAST.   ONETOUCH DELICA LANCETS 33G Misc Use to check BG once  a day.  Dx:E11.9 type 2 DM.   ONETOUCH VERIO test strip Generic drug:  glucose blood TEST ONCE A DAY          Objective:    BP (!) 162/85   Pulse 78   Temp (!) 96.9 F (36.1 C) (Oral)   Ht 5\' 1"  (1.549 m)   Wt 123 lb 12.8 oz (56.2 kg)   BMI 23.39 kg/m   No Known Allergies  Physical Exam  Constitutional: She is oriented to person, place, and time. She appears well-developed and well-nourished.  HENT:  Head: Normocephalic and atraumatic.  Eyes: Conjunctivae and EOM are normal. Pupils are equal, round, and reactive to light.  Cardiovascular: Normal rate, regular rhythm, normal heart sounds and intact distal pulses.   Pulmonary/Chest: Effort normal and breath sounds normal.  Abdominal: Soft. Bowel sounds are normal.  Musculoskeletal:       Thoracic back: She exhibits tenderness and pain. She  exhibits normal range of motion, no swelling and no deformity.       Back:  Neurological: She is alert and oriented to person, place, and time. She has normal reflexes.  Skin: Skin is warm and dry. No rash noted.  Psychiatric: She has a normal mood and affect. Her behavior is normal. Judgment and thought content normal.  Nursing note and vitals reviewed.       Assessment & Plan:   1. Acute right-sided low back pain without sciatica - ibuprofen (ADVIL,MOTRIN) 600 MG tablet; Take 1 tablet (600 mg total) by mouth 2 (two) times daily.  Dispense: 40 tablet; Refill: 0   Current Outpatient Prescriptions:  .  amLODipine (NORVASC) 5 MG tablet, TAKE 1 TABLET (5 MG TOTAL) BY MOUTH DAILY., Disp: 90 tablet, Rfl: 1 .  aspirin 81 MG tablet, Take 81 mg by mouth daily., Disp: , Rfl:  .  atenolol (TENORMIN) 50 MG tablet, Take 1 tablet (50 mg total) by mouth daily., Disp: 90 tablet, Rfl: 1 .  atorvastatin (LIPITOR) 40 MG tablet, Take 1 tablet (40 mg total) by mouth daily at 6 PM., Disp: 90 tablet, Rfl: 1 .  calcium carbonate (OS-CAL) 600 MG TABS tablet, Take 600 mg by mouth daily., Disp: , Rfl:  .  levothyroxine (SYNTHROID, LEVOTHROID) 88 MCG tablet, Take 1 tablet (88 mcg total) by mouth daily before breakfast., Disp: 90 tablet, Rfl: 2 .  meclizine (ANTIVERT) 25 MG tablet, Take 1 tablet (25 mg total) by mouth 4 (four) times daily as needed for dizziness., Disp: 50 tablet, Rfl: 1 .  metFORMIN (GLUCOPHAGE-XR) 500 MG 24 hr tablet, TAKE 1 TABLET (500 MG TOTAL) BY MOUTH DAILY WITH BREAKFAST., Disp: 90 tablet, Rfl: 1 .  Omega-3 Fatty Acids (FISH OIL) 1000 MG CAPS, Take 1 capsule by mouth daily., Disp: , Rfl:  .  ONETOUCH DELICA LANCETS 33G MISC, Use to check BG once a day.  Dx:E11.9 type 2 DM., Disp: 100 each, Rfl: 3 .  ONETOUCH VERIO test strip, TEST ONCE A DAY, Disp: 100 each, Rfl: 2 .  ibuprofen (ADVIL,MOTRIN) 600 MG tablet, Take 1 tablet (600 mg total) by mouth 2 (two) times daily., Disp: 40 tablet, Rfl:  0  Continue all other maintenance medications as listed above.  Follow up plan: Return if symptoms worsen or fail to improve.  Educational handout given for low back pain  Remus Loffler PA-C [facility] [address]  [place], [place] [address] [phone]   07/10/2016, 11:10 AM

## 2016-07-10 NOTE — Patient Instructions (Signed)

## 2016-08-04 ENCOUNTER — Emergency Department (HOSPITAL_COMMUNITY): Payer: Medicare Other

## 2016-08-04 ENCOUNTER — Encounter (HOSPITAL_COMMUNITY): Payer: Self-pay

## 2016-08-04 ENCOUNTER — Inpatient Hospital Stay (HOSPITAL_COMMUNITY)
Admission: EM | Admit: 2016-08-04 | Discharge: 2016-08-08 | DRG: 291 | Disposition: A | Discharge status: Home / Self Care | Payer: Medicare Other | Attending: Internal Medicine | Admitting: Internal Medicine

## 2016-08-04 DIAGNOSIS — Z7982 Long term (current) use of aspirin: Secondary | ICD-10-CM | POA: Diagnosis not present

## 2016-08-04 DIAGNOSIS — I5043 Acute on chronic combined systolic (congestive) and diastolic (congestive) heart failure: Secondary | ICD-10-CM | POA: Diagnosis not present

## 2016-08-04 DIAGNOSIS — R079 Chest pain, unspecified: Secondary | ICD-10-CM | POA: Diagnosis not present

## 2016-08-04 DIAGNOSIS — N183 Chronic kidney disease, stage 3 unspecified: Secondary | ICD-10-CM | POA: Diagnosis present

## 2016-08-04 DIAGNOSIS — I340 Nonrheumatic mitral (valve) insufficiency: Secondary | ICD-10-CM | POA: Diagnosis not present

## 2016-08-04 DIAGNOSIS — I5033 Acute on chronic diastolic (congestive) heart failure: Secondary | ICD-10-CM | POA: Diagnosis present

## 2016-08-04 DIAGNOSIS — I509 Heart failure, unspecified: Secondary | ICD-10-CM

## 2016-08-04 DIAGNOSIS — I110 Hypertensive heart disease with heart failure: Secondary | ICD-10-CM | POA: Diagnosis present

## 2016-08-04 DIAGNOSIS — I447 Left bundle-branch block, unspecified: Secondary | ICD-10-CM | POA: Diagnosis not present

## 2016-08-04 DIAGNOSIS — J9602 Acute respiratory failure with hypercapnia: Secondary | ICD-10-CM | POA: Diagnosis present

## 2016-08-04 DIAGNOSIS — R0789 Other chest pain: Secondary | ICD-10-CM | POA: Diagnosis not present

## 2016-08-04 DIAGNOSIS — N189 Chronic kidney disease, unspecified: Secondary | ICD-10-CM | POA: Diagnosis not present

## 2016-08-04 DIAGNOSIS — E785 Hyperlipidemia, unspecified: Secondary | ICD-10-CM | POA: Diagnosis present

## 2016-08-04 DIAGNOSIS — E1100 Type 2 diabetes mellitus with hyperosmolarity without nonketotic hyperglycemic-hyperosmolar coma (NKHHC): Secondary | ICD-10-CM | POA: Diagnosis not present

## 2016-08-04 DIAGNOSIS — N184 Chronic kidney disease, stage 4 (severe): Secondary | ICD-10-CM | POA: Diagnosis present

## 2016-08-04 DIAGNOSIS — R0602 Shortness of breath: Secondary | ICD-10-CM

## 2016-08-04 DIAGNOSIS — E782 Mixed hyperlipidemia: Secondary | ICD-10-CM

## 2016-08-04 DIAGNOSIS — N179 Acute kidney failure, unspecified: Secondary | ICD-10-CM

## 2016-08-04 DIAGNOSIS — I5041 Acute combined systolic (congestive) and diastolic (congestive) heart failure: Secondary | ICD-10-CM | POA: Diagnosis not present

## 2016-08-04 DIAGNOSIS — F17200 Nicotine dependence, unspecified, uncomplicated: Secondary | ICD-10-CM | POA: Diagnosis not present

## 2016-08-04 DIAGNOSIS — E030 Congenital hypothyroidism with diffuse goiter: Secondary | ICD-10-CM | POA: Diagnosis not present

## 2016-08-04 DIAGNOSIS — F17210 Nicotine dependence, cigarettes, uncomplicated: Secondary | ICD-10-CM | POA: Diagnosis present

## 2016-08-04 DIAGNOSIS — E039 Hypothyroidism, unspecified: Secondary | ICD-10-CM | POA: Diagnosis not present

## 2016-08-04 DIAGNOSIS — I130 Hypertensive heart and chronic kidney disease with heart failure and stage 1 through stage 4 chronic kidney disease, or unspecified chronic kidney disease: Principal | ICD-10-CM | POA: Diagnosis present

## 2016-08-04 DIAGNOSIS — J9601 Acute respiratory failure with hypoxia: Secondary | ICD-10-CM

## 2016-08-04 DIAGNOSIS — E119 Type 2 diabetes mellitus without complications: Secondary | ICD-10-CM

## 2016-08-04 DIAGNOSIS — Z79899 Other long term (current) drug therapy: Secondary | ICD-10-CM

## 2016-08-04 DIAGNOSIS — I5021 Acute systolic (congestive) heart failure: Secondary | ICD-10-CM | POA: Diagnosis not present

## 2016-08-04 DIAGNOSIS — I360 Nonrheumatic tricuspid (valve) stenosis: Secondary | ICD-10-CM | POA: Diagnosis not present

## 2016-08-04 LAB — BASIC METABOLIC PANEL
ANION GAP: 8 (ref 5–15)
BUN: 37 mg/dL — ABNORMAL HIGH (ref 6–20)
CO2: 23 mmol/L (ref 22–32)
Calcium: 9.1 mg/dL (ref 8.9–10.3)
Chloride: 107 mmol/L (ref 101–111)
Creatinine, Ser: 1.74 mg/dL — ABNORMAL HIGH (ref 0.44–1.00)
GFR, EST AFRICAN AMERICAN: 31 mL/min — AB (ref >60)
GFR, EST NON AFRICAN AMERICAN: 27 mL/min — AB (ref >60)
Glucose, Bld: 115 mg/dL — ABNORMAL HIGH (ref 65–99)
POTASSIUM: 4.5 mmol/L (ref 3.5–5.1)
SODIUM: 138 mmol/L (ref 135–145)

## 2016-08-04 LAB — CBC
HCT: 27.6 % — ABNORMAL LOW (ref 36.0–46.0)
HEMOGLOBIN: 9.9 g/dL — AB (ref 12.0–15.0)
MCH: 30.6 pg (ref 26.0–34.0)
MCHC: 35.9 g/dL (ref 30.0–36.0)
MCV: 85.2 fL (ref 78.0–100.0)
Platelets: 270 10*3/uL (ref 150–400)
RBC: 3.24 MIL/uL — ABNORMAL LOW (ref 3.87–5.11)
RDW: 15.5 % (ref 11.5–15.5)
WBC: 7.8 10*3/uL (ref 4.0–10.5)

## 2016-08-04 LAB — D-DIMER, QUANTITATIVE: D-Dimer, Quant: 2.35 ug/mL-FEU — ABNORMAL HIGH (ref 0.00–0.50)

## 2016-08-04 LAB — PROTIME-INR
INR: 1.13
PROTHROMBIN TIME: 14.6 s (ref 11.4–15.2)

## 2016-08-04 LAB — I-STAT TROPONIN, ED: TROPONIN I, POC: 0.03 ng/mL (ref 0.00–0.08)

## 2016-08-04 LAB — GLUCOSE, CAPILLARY: Glucose-Capillary: 134 mg/dL — ABNORMAL HIGH (ref 65–99)

## 2016-08-04 LAB — TROPONIN I: Troponin I: <0.03 ng/mL (ref <0.03)

## 2016-08-04 LAB — TSH: TSH: 1.986 u[IU]/mL (ref 0.350–4.500)

## 2016-08-04 LAB — BRAIN NATRIURETIC PEPTIDE: B NATRIURETIC PEPTIDE 5: 2738.8 pg/mL — AB (ref 0.0–100.0)

## 2016-08-04 LAB — CBG MONITORING, ED: GLUCOSE-CAPILLARY: 104 mg/dL — AB (ref 65–99)

## 2016-08-04 MED ORDER — NITROGLYCERIN 2 % TD OINT
0.5000 [in_us] | TOPICAL_OINTMENT | Freq: Four times a day (QID) | TRANSDERMAL | Status: DC
  Administered 2016-08-04 – 2016-08-06 (×8): 0.5 [in_us] via TOPICAL
  Filled 2016-08-04: qty 1
  Filled 2016-08-04: qty 30

## 2016-08-04 MED ORDER — ACETAMINOPHEN 325 MG PO TABS: 650.0000 mg | ORAL_TABLET | ORAL | Status: DC | PRN

## 2016-08-04 MED ORDER — SODIUM CHLORIDE 0.9% FLUSH
3.0000 mL | Freq: Two times a day (BID) | INTRAVENOUS | Status: DC
  Administered 2016-08-04 – 2016-08-07 (×6): 3 mL via INTRAVENOUS

## 2016-08-04 MED ORDER — FUROSEMIDE 10 MG/ML IJ SOLN: 40.0000 mg | Freq: Once | INTRAMUSCULAR | Status: DC

## 2016-08-04 MED ORDER — ATORVASTATIN CALCIUM 40 MG PO TABS
40.0000 mg | ORAL_TABLET | Freq: Every day | ORAL | Status: DC
  Administered 2016-08-05 – 2016-08-06 (×2): 40 mg via ORAL
  Filled 2016-08-04 (×2): qty 1

## 2016-08-04 MED ORDER — INSULIN ASPART 100 UNIT/ML ~~LOC~~ SOLN: 0.0000 [IU] | Freq: Every day | SUBCUTANEOUS | Status: DC

## 2016-08-04 MED ORDER — ONDANSETRON HCL 4 MG/2ML IJ SOLN: 4.0000 mg | Freq: Four times a day (QID) | INTRAMUSCULAR | Status: DC | PRN

## 2016-08-04 MED ORDER — NICOTINE 21 MG/24HR TD PT24
21.0000 mg | MEDICATED_PATCH | Freq: Every day | TRANSDERMAL | Status: DC
  Administered 2016-08-04 – 2016-08-08 (×5): 21 mg via TRANSDERMAL
  Filled 2016-08-04 (×5): qty 1

## 2016-08-04 MED ORDER — INSULIN ASPART 100 UNIT/ML ~~LOC~~ SOLN
0.0000 [IU] | Freq: Three times a day (TID) | SUBCUTANEOUS | Status: DC
  Administered 2016-08-06: 3 [IU] via SUBCUTANEOUS
  Administered 2016-08-06 – 2016-08-07 (×2): 2 [IU] via SUBCUTANEOUS
  Administered 2016-08-07: 5 [IU] via SUBCUTANEOUS
  Administered 2016-08-07: 1 [IU] via SUBCUTANEOUS
  Administered 2016-08-08: 2 [IU] via SUBCUTANEOUS

## 2016-08-04 MED ORDER — FUROSEMIDE 10 MG/ML IJ SOLN
60.0000 mg | Freq: Two times a day (BID) | INTRAMUSCULAR | Status: DC
  Administered 2016-08-04 – 2016-08-06 (×4): 60 mg via INTRAVENOUS
  Filled 2016-08-04 (×2): qty 6
  Filled 2016-08-04: qty 8
  Filled 2016-08-04: qty 6

## 2016-08-04 MED ORDER — ORAL CARE MOUTH RINSE
15.0000 mL | Freq: Two times a day (BID) | OROMUCOSAL | Status: DC
  Administered 2016-08-05 – 2016-08-07 (×4): 15 mL via OROMUCOSAL

## 2016-08-04 MED ORDER — CARVEDILOL 6.25 MG PO TABS
6.2500 mg | ORAL_TABLET | Freq: Two times a day (BID) | ORAL | Status: DC
  Administered 2016-08-04 – 2016-08-08 (×7): 6.25 mg via ORAL
  Filled 2016-08-04 (×7): qty 1

## 2016-08-04 MED ORDER — AMLODIPINE BESYLATE 5 MG PO TABS: 5.0000 mg | ORAL_TABLET | Freq: Every day | ORAL | Status: DC

## 2016-08-04 MED ORDER — ASPIRIN 81 MG PO CHEW
81.0000 mg | CHEWABLE_TABLET | Freq: Every day | ORAL | Status: DC
  Administered 2016-08-04 – 2016-08-08 (×5): 81 mg via ORAL
  Filled 2016-08-04 (×5): qty 1

## 2016-08-04 MED ORDER — HYDRALAZINE HCL 50 MG PO TABS
50.0000 mg | ORAL_TABLET | Freq: Three times a day (TID) | ORAL | Status: DC
Start: 2016-08-04 — End: 2016-08-08
  Administered 2016-08-04 – 2016-08-08 (×10): 50 mg via ORAL
  Filled 2016-08-04 (×10): qty 1

## 2016-08-04 MED ORDER — SODIUM CHLORIDE 0.9% FLUSH: 3.0000 mL | INTRAVENOUS | Status: DC | PRN

## 2016-08-04 MED ORDER — ATENOLOL 50 MG PO TABS: 50.0000 mg | ORAL_TABLET | Freq: Every day | ORAL | Status: DC

## 2016-08-04 MED ORDER — MECLIZINE HCL 25 MG PO TABS: 25.0000 mg | ORAL_TABLET | Freq: Four times a day (QID) | ORAL | Status: DC | PRN

## 2016-08-04 MED ORDER — LEVOTHYROXINE SODIUM 88 MCG PO TABS
88.0000 ug | ORAL_TABLET | Freq: Every day | ORAL | Status: DC
  Administered 2016-08-05 – 2016-08-08 (×4): 88 ug via ORAL
  Filled 2016-08-04 (×4): qty 1

## 2016-08-04 MED ORDER — CALCIUM CARBONATE 1250 (500 CA) MG PO TABS
1.0000 | ORAL_TABLET | Freq: Every day | ORAL | Status: DC
  Administered 2016-08-05 – 2016-08-08 (×4): 500 mg via ORAL
  Filled 2016-08-04 (×4): qty 1

## 2016-08-04 MED ORDER — TECHNETIUM TC 99M DIETHYLENETRIAME-PENTAACETIC ACID
30.2000 | Freq: Once | INTRAVENOUS | Status: AC | PRN
  Administered 2016-08-04: 30.2 via INTRAVENOUS

## 2016-08-04 MED ORDER — TECHNETIUM TO 99M ALBUMIN AGGREGATED
4.1000 | Freq: Once | INTRAVENOUS | Status: AC | PRN
  Administered 2016-08-04: 4.1 via INTRAVENOUS

## 2016-08-04 MED ORDER — ENOXAPARIN SODIUM 30 MG/0.3ML ~~LOC~~ SOLN
30.0000 mg | SUBCUTANEOUS | Status: DC
  Administered 2016-08-04 – 2016-08-07 (×4): 30 mg via SUBCUTANEOUS
  Filled 2016-08-04 (×4): qty 0.3

## 2016-08-04 MED ORDER — SODIUM CHLORIDE 0.9 % IV SOLN: 250.0000 mL | INTRAVENOUS | Status: DC | PRN

## 2016-08-04 NOTE — ED Triage Notes (Signed)
Patient here with c/o left side chest pain started a bout a week ago. Pt rate her chest pain 2/10 at this time.

## 2016-08-04 NOTE — ED Notes (Signed)
Pt ambulated to restroom.  Pt was unable to walk back to room.  Pt stated that she was weak.  Pt's O2 was 84% when arrived back in room.   Informed RN that oxygen was increased to 3.5L to help obtain normal limit.

## 2016-08-04 NOTE — ED Provider Notes (Signed)
Pt presents to the ED today with sob and CP.  She was signed out by Dr. Erin HearingMessner pending result of VQ scan.  VQ scan showed low probability of PE.  Pt given lasix for CHF.  She was d/w Dr. Burney GauzeP. Singh (triad) for admission for hypoxia and CHF.   Jacalyn LefevreHaviland, Siri Buege, MD 08/04/16 [phone]

## 2016-08-04 NOTE — ED Notes (Signed)
Ambulated w/ near by assistance.  PT's O2 was at 82% when arrived back in room.     Pt was placed on 3L of O2 regained to 97%.

## 2016-08-04 NOTE — ED Notes (Signed)
Patient transported to CT 

## 2016-08-04 NOTE — ED Provider Notes (Signed)
[facility] DEPT Provider Note   CSN: [id number] Arrival date & time: 08/04/16  1042     History   Chief Complaint Chief Complaint  Patient presents with  . Chest Pain    HPI LORALEI RADCLIFFE is a 81 y.o. female.   Chest Pain   This is a new problem. The current episode started more than 1 week ago. The problem occurs constantly. The problem has not changed since onset.The pain is moderate. The pain does not radiate.    Past Medical History:  Diagnosis Date  . Allergy   . Cataract   . Diabetes mellitus without complication ([place])   . Hyperlipidemia   . Hypertension   . Thyroid disease     Patient Active Problem List   Diagnosis Date Noted  . Smoker 11/23/2014  . Kidney disease, chronic, stage III (moderate, EGFR 30-59 ml/min) 10/20/2014  . Diabetes ([place]) 10/15/2012  . HTN (hypertension) 05/06/2010  . Hypothyroidism 05/06/2010  . Hyperlipidemia 05/06/2010    Past Surgical History:  Procedure Laterality Date  . ABDOMINAL HYSTERECTOMY     partial but later ovaries removed  . EYE SURGERY Bilateral    cataract  . OOPHORECTOMY Bilateral     OB History    No data available       Home Medications    Prior to Admission medications   Medication Sig Start Date End Date Taking? Authorizing Provider  amLODipine (NORVASC) 5 MG tablet TAKE 1 TABLET (5 MG TOTAL) BY MOUTH DAILY. 05/02/16  Yes Hassell Done, Mary-Margaret, FNP  aspirin 81 MG tablet Take 81 mg by mouth daily.   Yes [provider]  atenolol (TENORMIN) 50 MG tablet Take 1 tablet (50 mg total) by mouth daily. 04/23/16  Yes Hassell Done, Mary-Margaret, FNP  atorvastatin (LIPITOR) 40 MG tablet Take 1 tablet (40 mg total) by mouth daily at 6 PM. 04/23/16  Yes Hassell Done, Mary-Margaret, FNP  calcium carbonate (OS-CAL) 600 MG TABS tablet Take 600 mg by mouth daily.   Yes [provider]  ibuprofen (ADVIL,MOTRIN) 600 MG tablet Take 1 tablet (600 mg total) by mouth 2 (two) times daily. 07/10/16  Yes Terald Sleeper, PA-C  levothyroxine (SYNTHROID, LEVOTHROID) 88 MCG tablet Take 1 tablet (88 mcg total) by mouth daily before breakfast. 04/23/16  Yes Hassell Done, Mary-Margaret, FNP  meclizine (ANTIVERT) 25 MG tablet Take 1 tablet (25 mg total) by mouth 4 (four) times daily as needed for dizziness. 05/07/16  Yes Hawks, Christy A, FNP  metFORMIN (GLUCOPHAGE-XR) 500 MG 24 hr tablet TAKE 1 TABLET (500 MG TOTAL) BY MOUTH DAILY WITH BREAKFAST. 04/23/16  Yes Martin, Mary-Margaret, FNP  Omega-3 Fatty Acids (FISH OIL) 1000 MG CAPS Take 1 capsule by mouth daily.   Yes [provider]  [facility] DELICA LANCETS 34L MISC Use to check BG once a day.  Dx:E11.9 type 2 DM. 10/26/15   Cherre Robins, PharmD  [facility] VERIO test strip TEST ONCE A DAY 05/02/16   Chevis Pretty, FNP    Family History Family History  Problem Relation Age of Onset  . Heart disease Mother   . Heart attack Mother        massive  . COPD Father   . Emphysema Father   . Down syndrome Brother     Social History Social History  Substance Use Topics  . Smoking status: Current Every Day Smoker    Packs/day: 0.50    Years: 55.00    Types: Cigarettes  . Smokeless tobacco: Never Used  .  Alcohol use No     Allergies   Patient has no known allergies.   Review of Systems Review of Systems  Cardiovascular: Positive for chest pain.  All other systems reviewed and are negative.    Physical Exam Updated Vital Signs BP (!) 156/96 (BP Location: Right Arm)   Pulse 85   Temp 98 F (36.7 C) (Oral)   Resp 20   Ht 5' 2.5" (1.588 m)   Wt 55.8 kg (123 lb)   SpO2 94%   BMI 22.14 kg/m   Physical Exam  Constitutional: She is oriented to person, place, and time. She appears well-developed and well-nourished.  HENT:  Head: Normocephalic and atraumatic.  Eyes: Conjunctivae and EOM are normal.  Neck: Normal range of motion.  Cardiovascular: Normal rate and regular rhythm.   Pulmonary/Chest: No stridor. No respiratory distress. She has  rales.  Abdominal: She exhibits no distension.  Musculoskeletal: She exhibits no edema or deformity.  Neurological: She is alert and oriented to person, place, and time.  Nursing note and vitals reviewed.    ED Treatments / Results  Labs (all labs ordered are listed, but only abnormal results are displayed) Labs Reviewed  BASIC METABOLIC PANEL - Abnormal; Notable for the following:       Result Value   Glucose, Bld 115 (*)    BUN 37 (*)    Creatinine, Ser 1.74 (*)    GFR calc non Af Amer 27 (*)    GFR calc Af Amer 31 (*)    All other components within normal limits  CBC - Abnormal; Notable for the following:    RBC 3.24 (*)    Hemoglobin 9.9 (*)    HCT 27.6 (*)    All other components within normal limits  D-DIMER, QUANTITATIVE (NOT AT [facility]) - Abnormal; Notable for the following:    D-Dimer, Quant 2.35 (*)    All other components within normal limits  BRAIN NATRIURETIC PEPTIDE - Abnormal; Notable for the following:    B Natriuretic Peptide 2,738.8 (*)    All other components within normal limits  CBG MONITORING, ED - Abnormal; Notable for the following:    Glucose-Capillary 104 (*)    All other components within normal limits  I-STAT TROPOININ, ED    EKG  EKG Interpretation  Date/Time:  Saturday August 04 2016 10:54:13 EDT Ventricular Rate:  85 PR Interval:    QRS Duration: 144 QT Interval:  431 QTC Calculation: 513 R Axis:   -62 Text Interpretation:  Age not entered, assumed to be  81 years old for purpose of ECG interpretation Sinus rhythm Ventricular premature complex Left bundle branch block No old tracing to compare Confirmed by Merrily Pew [phone]) on 08/04/2016 11:19:24 AM       Radiology Dg Chest 2 View  Result Date: 08/04/2016 CLINICAL DATA:  81 year old female with left-sided chest pain for the past week EXAM: CHEST  2 VIEW COMPARISON:  Prior chest x-ray 11/23/2014 FINDINGS: Stable cardiac and mediastinal contours. Atherosclerotic calcifications present  in the transverse aorta. Significantly increased pulmonary vascular congestion with diffuse interstitial prominence throughout both lungs. Imaging findings are most consistent with mild pulmonary edema. The lungs remain hyperinflated with central bronchitic changes consistent with COPD. Small amount of fluid tracks along the major and minor fissures. No pneumothorax. No acute osseous abnormality. IMPRESSION: Radiographic findings are most consistent with mild CHF. Background pulmonary parenchymal changes suggest underlying COPD. Aortic Atherosclerosis (ICD10-170.0) Electronically Signed   By: Dellis Filbert.D.  On: 08/04/2016 12:14    Procedures Procedures (including critical care time)  Medications Ordered in ED Medications - No data to display   Initial Impression / Assessment and Plan / ED Course  I have reviewed the triage vital signs and the nursing notes.  Pertinent labs & imaging results that were available during my care of the patient were reviewed by me and considered in my medical decision making (see chart for details).    Labs/xr supporting new onset HF. Awaiting VQ scan but spoke with cardiology who will see patient in AM to help manage new onset HF. Recommend diuresing/echo if VQ is normal.    Care transferred pending results of VQ scan, suspect likely new onset CHF.   Final Clinical Impressions(s) / ED Diagnoses   Final diagnoses:  Chest pain  Acute respiratory failure with hypoxia ([place])  Acute congestive heart failure, unspecified heart failure type [facility])     Charnetta Wulff, Corene Cornea, MD 08/05/16 1553

## 2016-08-04 NOTE — H&P (Addendum)
TRH H&P   Patient Demographics:    Maimuna Leaman, is a 81 y.o. female  MRN: [id number]   DOB - [birth year]  Admit Date - 08/04/2016  Outpatient Primary MD for the patient is Bennie Pierini, FNP   Patient coming from: Home  Chief Complaint  Patient presents with  . Chest Pain      HPI:    Jasiyah Poland  is a 81 y.o. female, History of hypertension, DM type II, dyslipidemia, hypothyroidism, smoking which is ongoing, CK D for baseline creatinine close to 1.5 who lives at home and comes to the hospital with 1 week history of left-sided chest pain which at times radiates to her left shoulder and arm, pain is dull, it is intermittent, usually brought in by her rest and lying flat and gets better with exertion. She has minimal shortness of breath associated with it and a mild cough productive of clear frothy sputum. She denies any fever chills, no exposure to sick contacts or recent travel. Came to the ER where she was diagnosed with CHF and I was called to admit.    Review of systems:    In addition to the HPI above,   No Fever-chills, No Headache, No changes with Vision or hearing, No problems swallowing food or Liquids, Chest symptoms as above, No Abdominal pain, No Nausea or Vommitting, Bowel movements are regular, No Blood in stool or Urine, No dysuria, No new skin rashes or bruises, No new joints pains-aches,  No new weakness, tingling, numbness in any extremity, No recent weight gain or loss, No polyuria, polydypsia or polyphagia, No significant Mental Stressors.  A full 10 point Review of Systems was done, except as stated above, all other Review of Systems were negative.   With Past History of  the following :    Past Medical History:  Diagnosis Date  . Allergy   . Cataract   . Diabetes mellitus without complication (HCC)   . Hyperlipidemia   . Hypertension   . Thyroid disease       Past Surgical History:  Procedure Laterality Date  . ABDOMINAL HYSTERECTOMY     partial but later ovaries removed  . EYE SURGERY Bilateral    cataract  . OOPHORECTOMY Bilateral       Social History:     Social History  Substance  Use Topics  . Smoking status: Current Every Day Smoker    Packs/day: 0.50    Years: 55.00    Types: Cigarettes  . Smokeless tobacco: Never Used  . Alcohol use No         Family History :     Family History  Problem Relation Age of Onset  . Heart disease Mother   . Heart attack Mother        massive  . COPD Father   . Emphysema Father   . Down syndrome Brother        Home Medications:   Prior to Admission medications   Medication Sig Start Date End Date Taking? Authorizing Provider  amLODipine (NORVASC) 5 MG tablet TAKE 1 TABLET (5 MG TOTAL) BY MOUTH DAILY. 05/02/16  Yes Daphine Deutscher, Mary-Margaret, FNP  aspirin 81 MG tablet Take 81 mg by mouth daily.   Yes [provider]  atenolol (TENORMIN) 50 MG tablet Take 1 tablet (50 mg total) by mouth daily. 04/23/16  Yes Daphine Deutscher, Mary-Margaret, FNP  atorvastatin (LIPITOR) 40 MG tablet Take 1 tablet (40 mg total) by mouth daily at 6 PM. 04/23/16  Yes Daphine Deutscher, Mary-Margaret, FNP  calcium carbonate (OS-CAL) 600 MG TABS tablet Take 600 mg by mouth daily.   Yes [provider]  ibuprofen (ADVIL,MOTRIN) 600 MG tablet Take 1 tablet (600 mg total) by mouth 2 (two) times daily. 07/10/16  Yes Remus Loffler, PA-C  levothyroxine (SYNTHROID, LEVOTHROID) 88 MCG tablet Take 1 tablet (88 mcg total) by mouth daily before breakfast. 04/23/16  Yes Daphine Deutscher, Mary-Margaret, FNP  meclizine (ANTIVERT) 25 MG tablet Take 1 tablet (25 mg total) by mouth 4 (four) times daily as needed for dizziness. 05/07/16  Yes Hawks,  Christy A, FNP  metFORMIN (GLUCOPHAGE-XR) 500 MG 24 hr tablet TAKE 1 TABLET (500 MG TOTAL) BY MOUTH DAILY WITH BREAKFAST. 04/23/16  Yes Martin, Mary-Margaret, FNP  Omega-3 Fatty Acids (FISH OIL) 1000 MG CAPS Take 1 capsule by mouth daily.   Yes [provider]  [facility] DELICA LANCETS 33G MISC Use to check BG once a day.  Dx:E11.9 type 2 DM. 10/26/15   Henrene Pastor, PharmD  [facility] VERIO test strip TEST ONCE A DAY 05/02/16   Daphine Deutscher Mary-Margaret, FNP     Allergies:    No Known Allergies   Physical Exam:   Vitals  Blood pressure (!) 156/96, pulse 85, temperature 98 F (36.7 C), temperature source Oral, resp. rate 20, height 5' 2.5" (1.588 m), weight 55.8 kg (123 lb), SpO2 99 %.   1. General White elderly thin female lying in bed in NAD,    2. Normal affect and insight, Not Suicidal or Homicidal, Awake Alert, Oriented X 3.  3. No F.N deficits, ALL C.Nerves Intact, Strength 5/5 all 4 extremities, Sensation intact all 4 extremities, Plantars down going.  4. Ears and Eyes appear Normal, Conjunctivae clear, PERRLA. Moist Oral Mucosa.  5. Supple Neck, elevated JVD, No cervical lymphadenopathy appriciated, No Carotid Bruits.  6. Symmetrical Chest wall movement, Good air movement bilaterally, positive bilateral rales which are diffuse,  7. RRR, No Gallops, Rubs or Murmurs, No Parasternal Heave.  8. Positive Bowel Sounds, Abdomen Soft, No tenderness, No organomegaly appriciated,No rebound -guarding or rigidity.  9.  No Cyanosis, Normal Skin Turgor, No Skin Rash or Bruise.  10. Good muscle tone,  joints appear normal , no effusions, Normal ROM.  11. No Palpable Lymph Nodes in Neck or Axillae      Data Review:  CBC  Recent Labs Lab 08/04/16 1135  WBC 7.8  HGB 9.9*  HCT 27.6*  PLT 270  MCV 85.2  MCH 30.6  MCHC 35.9  RDW 15.5   ------------------------------------------------------------------------------------------------------------------  Chemistries    Recent Labs Lab 08/04/16 1135  NA 138  K 4.5  CL 107  CO2 23  GLUCOSE 115*  BUN 37*  CREATININE 1.74*  CALCIUM 9.1   ------------------------------------------------------------------------------------------------------------------ estimated creatinine clearance is 21.6 mL/min (A) (by C-G formula based on SCr of 1.74 mg/dL (H)). ------------------------------------------------------------------------------------------------------------------ No results for input(s): TSH, T4TOTAL, T3FREE, THYROIDAB in the last 72 hours.  Invalid input(s): FREET3  Coagulation profile No results for input(s): INR, PROTIME in the last 168 hours. -------------------------------------------------------------------------------------------------------------------  Recent Labs  08/04/16 1143  DDIMER 2.35*   -------------------------------------------------------------------------------------------------------------------  Cardiac Enzymes No results for input(s): CKMB, TROPONINI, MYOGLOBIN in the last 168 hours.  Invalid input(s): CK ------------------------------------------------------------------------------------------------------------------    Component Value Date/Time   BNP 2,738.8 (H) 08/04/2016 1253     ---------------------------------------------------------------------------------------------------------------  Urinalysis No results found for: COLORURINE, APPEARANCEUR, LABSPEC, PHURINE, GLUCOSEU, HGBUR, BILIRUBINUR, KETONESUR, PROTEINUR, UROBILINOGEN, NITRITE, LEUKOCYTESUR  ----------------------------------------------------------------------------------------------------------------   Imaging Results:    Dg Chest 2 View  Result Date: 08/04/2016 CLINICAL DATA:  81 year old female with left-sided chest pain for the past week EXAM: CHEST  2 VIEW COMPARISON:  Prior chest x-ray 11/23/2014 FINDINGS: Stable cardiac and mediastinal contours. Atherosclerotic calcifications present  in the transverse aorta. Significantly increased pulmonary vascular congestion with diffuse interstitial prominence throughout both lungs. Imaging findings are most consistent with mild pulmonary edema. The lungs remain hyperinflated with central bronchitic changes consistent with COPD. Small amount of fluid tracks along the major and minor fissures. No pneumothorax. No acute osseous abnormality. IMPRESSION: Radiographic findings are most consistent with mild CHF. Background pulmonary parenchymal changes suggest underlying COPD. Aortic Atherosclerosis (ICD10-170.0) Electronically Signed   By: Malachy MoanHeath  McCullough M.D.   On: 08/04/2016 12:14   Nm Pulmonary Perf And Vent  Result Date: 08/04/2016 CLINICAL DATA:  Elevated D-dimer, shortness of breath, chest pain, symptoms for 1 week EXAM: NUCLEAR MEDICINE VENTILATION - PERFUSION LUNG SCAN TECHNIQUE: Ventilation images were obtained in multiple projections using inhaled aerosol Tc-2874m DTPA. Perfusion images were obtained in multiple projections after intravenous injection of Tc-5674m MAA. RADIOPHARMACEUTICALS:  30.2 mCi Technetium-5174m DTPA aerosol inhalation and 4.1 mCi Technetium-6674m MAA IV COMPARISON:  None Radiographic correlation:  Chest radiograph 08/04/2016 FINDINGS: Ventilation: Central airway deposition of tracer. Patchy areas of decreased perfusion in the RIGHT upper lobe and RIGHT middle lobe. Less well defined diminished ventilation at the apices. Perfusion: Diminished perfusion RIGHT mid lung, with overall better perfusion than ventilation. No additional segmental or subsegmental perfusion defects identified. Chest radiograph:  COPD changes with superimposed CHF IMPRESSION: Low probability for pulmonary embolism. Electronically Signed   By: Ulyses SouthwardMark  Boles M.D.   On: 08/04/2016 16:19    My personal review of EKG: Rhythm NSR, left bundle branch block   Assessment & Plan:     1. Intermittent left-sided chest pain with mild shortness of breath for 1 week,  elevated proBNP, negative troponin - patient has acute on chronic nonspecific CHF no previous to echocardiogram on file, she will be admitted to telemetry bed, ASA, IV Lasix, Nitropaste, Coreg, hydralazine, fluid and salt restriction, cycle troponin, CHF pathway, obtain echocardiogram. Case discussed with cardiologist Dr. Rennis GoldenHilty they will see the patient in the morning he agrees with the plan and management. Patient is currently chest pain-free and comfortable.  2. CK D stage III to 4. Baseline  creatinine close to 1.5, avoid ACE/ARB, monitor with diuretics.  3. Dyslipidemia. Continue statin.  4. Essential hypertension. Placed on combination of Coreg, hydralazine and Nitropaste along with diuretic. Will monitor.  5. Smoking. Counseled to quit smoking, nicotine patch for now.  6. Hypothyroidism. Continue home dose Synthroid check TSH.  7. DM type II. Check A1c, hold Glucophage, sliding scale.  8.++ D Dimer - checked in the ER for SOB, -ve VQ, check Leg [place], no leg swelling clinical suspicion for DVT very low.   DVT Prophylaxis  Lovenox   AM Labs Ordered, also please review Full Orders  Family Communication: Admission, patients condition and plan of care including tests being ordered have been discussed with the patient and sister who indicate understanding and agree with the plan and Code Status.  Code Status Full  Likely DC to  TBD  Condition GUARDED    Consults called: Cards    Admission status: Inpt    Time spent in minutes : 35   Susa Raring M.D on 08/04/2016 at 5:00 PM  Between 7am to 7pm - Pager - [phone] ( page via [employer], text pages only, please mention full 10 digit call back number).  After 7pm go to [email or web address] - password [facility]  [facility] - Office  [phone]

## 2016-08-05 ENCOUNTER — Inpatient Hospital Stay (HOSPITAL_COMMUNITY): Payer: Medicare Other

## 2016-08-05 DIAGNOSIS — I360 Nonrheumatic tricuspid (valve) stenosis: Secondary | ICD-10-CM

## 2016-08-05 DIAGNOSIS — I447 Left bundle-branch block, unspecified: Secondary | ICD-10-CM

## 2016-08-05 DIAGNOSIS — I509 Heart failure, unspecified: Secondary | ICD-10-CM

## 2016-08-05 DIAGNOSIS — R079 Chest pain, unspecified: Secondary | ICD-10-CM

## 2016-08-05 DIAGNOSIS — R0789 Other chest pain: Secondary | ICD-10-CM

## 2016-08-05 DIAGNOSIS — I340 Nonrheumatic mitral (valve) insufficiency: Secondary | ICD-10-CM

## 2016-08-05 LAB — ECHOCARDIOGRAM COMPLETE
Height: 62.5 in
Weight: 1908.3 oz

## 2016-08-05 LAB — BASIC METABOLIC PANEL
ANION GAP: 10 (ref 5–15)
BUN: 37 mg/dL — ABNORMAL HIGH (ref 6–20)
CALCIUM: 8.9 mg/dL (ref 8.9–10.3)
CO2: 23 mmol/L (ref 22–32)
CREATININE: 1.64 mg/dL — AB (ref 0.44–1.00)
Chloride: 105 mmol/L (ref 101–111)
GFR, EST AFRICAN AMERICAN: 33 mL/min — AB (ref >60)
GFR, EST NON AFRICAN AMERICAN: 29 mL/min — AB (ref >60)
Glucose, Bld: 86 mg/dL (ref 65–99)
Potassium: 3.8 mmol/L (ref 3.5–5.1)
SODIUM: 138 mmol/L (ref 135–145)

## 2016-08-05 LAB — GLUCOSE, CAPILLARY
GLUCOSE-CAPILLARY: 100 mg/dL — AB (ref 65–99)
GLUCOSE-CAPILLARY: 131 mg/dL — AB (ref 65–99)
Glucose-Capillary: 140 mg/dL — ABNORMAL HIGH (ref 65–99)
Glucose-Capillary: 117 mg/dL — ABNORMAL HIGH (ref 65–99)

## 2016-08-05 LAB — TROPONIN I
Troponin I: <0.03 ng/mL (ref <0.03)
Troponin I: 0.03 ng/mL (ref <0.03)

## 2016-08-05 LAB — MAGNESIUM: Magnesium: 1.7 mg/dL (ref 1.7–2.4)

## 2016-08-05 MED ORDER — REGADENOSON 0.4 MG/5ML IV SOLN
0.4000 mg | Freq: Once | INTRAVENOUS | Status: DC
  Filled 2016-08-05: qty 5

## 2016-08-05 MED ORDER — POTASSIUM CHLORIDE CRYS ER 20 MEQ PO TBCR
40.0000 meq | EXTENDED_RELEASE_TABLET | Freq: Once | ORAL | Status: AC
  Administered 2016-08-05: 40 meq via ORAL
  Filled 2016-08-05: qty 2

## 2016-08-05 NOTE — Progress Notes (Signed)
CRITICAL VALUE ALERT  Critical Value:  Trop- 0.03  Date & Time Notied:  7:19 AM 08/05/2016  Provider Notified: Thedore MinsSingh  Orders Received/Actions taken:

## 2016-08-05 NOTE — Consult Note (Signed)
Cardiology Consultation:   Patient ID: Michele Walters; [id number]; 1935/07/15   Admit date: 08/04/2016 Date of Consult: 08/05/2016  Primary Care Provider: Bennie Pierini, FNP Primary Cardiologist: new , Saina Waage Primary Electrophysiologist:  None    Patient Profile:   Michele Walters is a 81 y.o. female with a hx of HTN, DM type 2, hypothyroidism  who is being seen today for the evaluation of increasing dyspnea  chest pain , arm pain  at the request of Dr. Thedore Mins. .  History of Present Illness:   Michele Walters is a 81 year old female with a history of hypertension and type 2 diabetes mellitus. He has chronic kidney disease with a baseline creatinine of 1.5. She now presents with a one-week history of chest discomfort that radiates to her left shoulder and arm. It is a dull and intermittent chest pain.  She also planes of shortness of breath that seems to be somewhat progressive. She's had a mild cough with clear sputum.  The pain seems to be worse when she lies flat. It's better when she sitting forward. She denies any pain with a deep breath.  She continues to smoke. Her troponin levels are negative. B natruretic peptide is 2738.  CXR shows mild CHF D-dimer was 2.35.  VQ scan was low probability for pulmonary embolus.  Past Medical History:  Diagnosis Date  . Allergy   . Cataract   . Diabetes mellitus without complication (HCC)   . Hyperlipidemia   . Hypertension   . Thyroid disease     Past Surgical History:  Procedure Laterality Date  . ABDOMINAL HYSTERECTOMY     partial but later ovaries removed  . EYE SURGERY Bilateral    cataract  . OOPHORECTOMY Bilateral      Inpatient Medications: Scheduled Meds: . aspirin  81 mg Oral Daily  . atorvastatin  40 mg Oral q1800  . calcium carbonate  1 tablet Oral Daily  . carvedilol  6.25 mg Oral BID WC  . enoxaparin (LOVENOX) injection  30 mg Subcutaneous Q24H  . furosemide  60 mg Intravenous Q12H  . hydrALAZINE  50 mg  Oral Q8H  . insulin aspart  0-5 Units Subcutaneous QHS  . insulin aspart  0-9 Units Subcutaneous TID WC  . levothyroxine  88 mcg Oral QAC breakfast  . mouth rinse  15 mL Mouth Rinse BID  . nicotine  21 mg Transdermal Daily  . nitroGLYCERIN  0.5 inch Topical Q6H  . sodium chloride flush  3 mL Intravenous Q12H   Continuous Infusions: . sodium chloride     PRN Meds: sodium chloride, acetaminophen, meclizine, ondansetron (ZOFRAN) IV, sodium chloride flush  Allergies:   No Known Allergies  Social History:   Social History   Social History  . Marital status: Single    Spouse name: N/A  . Number of children: N/A  . Years of education: N/A   Occupational History  . Not on file.   Social History Main Topics  . Smoking status: Current Every Day Smoker    Packs/day: 0.50    Years: 55.00    Types: Cigarettes  . Smokeless tobacco: Never Used  . Alcohol use No  . Drug use: No  . Sexual activity: No   Other Topics Concern  . Not on file   Social History Narrative  . No narrative on file    Family History:   The patient's family history includes COPD in her father; Down syndrome in her brother; Emphysema in her father;  Heart attack in her mother; Heart disease in her mother.  ROS:  Please see the history of present illness.  ROS  All other ROS reviewed and negative.     Physical Exam/Data:   Vitals:   08/04/16 1700 08/04/16 1800 08/04/16 2052 08/05/16 0536  BP: (!) 170/92 (!) 157/75 135/68 127/71  Pulse: 68 63 69 67  Resp: (!) 22  20 18   Temp:   97.7 F (36.5 C) 97.5 F (36.4 C)  TempSrc:   Oral Oral  SpO2: 100% 94% 96% 98%  Weight:    119 lb 4.3 oz (54.1 kg)  Height:        Intake/Output Summary (Last 24 hours) at 08/05/16 0950 Last data filed at 08/05/16 [id number]  Gross per 24 hour  Intake                0 ml  Output             2650 ml  Net            -2650 ml   Filed Weights   08/04/16 1051 08/05/16 0536  Weight: 123 lb (55.8 kg) 119 lb 4.3 oz (54.1 kg)     Body mass index is 21.47 kg/m.  General:  Very thin, chronically ill-appearing elderly female. HEENT: normal Lymph: no adenopathy Neck: no JVD Endocrine:  No thryomegaly Vascular: No carotid bruits; FA pulses 2+ bilaterally without bruits  Cardiac:  Regular rate, S1 and S2. There is no murmur. There is no rub. Lungs:  Mostly clear. She has a few rales and rhonchi. Abd:  She is very thin. soft, nontender, no hepatomegaly  Ext: no edema Musculoskeletal:  No deformities, BUE and BLE strength normal and equal Skin: warm and dry  Neuro:  CNs 2-12 intact, no focal abnormalities noted Psych:  Normal affect   EKG:  The EKG was personally reviewed and demonstrates:   NSR at 70.   LBBB ( new)   Telemetry:  Telemetry was personally reviewed and demonstrates:  NSR   Relevant CV Studies:   Laboratory Data:  Chemistry Recent Labs Lab 08/04/16 1135 08/05/16 0512  NA 138 138  K 4.5 3.8  CL 107 105  CO2 23 23  GLUCOSE 115* 86  BUN 37* 37*  CREATININE 1.74* 1.64*  CALCIUM 9.1 8.9  GFRNONAA 27* 29*  GFRAA 31* 33*  ANIONGAP 8 10    No results for input(s): PROT, ALBUMIN, AST, ALT, ALKPHOS, BILITOT in the last 168 hours. Hematology Recent Labs Lab 08/04/16 1135  WBC 7.8  RBC 3.24*  HGB 9.9*  HCT 27.6*  MCV 85.2  MCH 30.6  MCHC 35.9  RDW 15.5  PLT 270   Cardiac Enzymes Recent Labs Lab 08/04/16 1729 08/04/16 2245 08/05/16 0512  TROPONINI <0.03 <0.03 0.03*    Recent Labs Lab 08/04/16 1141  TROPIPOC 0.03    BNP Recent Labs Lab 08/04/16 1253  BNP 2,738.8*    DDimer  Recent Labs Lab 08/04/16 1143  DDIMER 2.35*    Radiology/Studies:  Dg Chest 2 View  Result Date: 08/04/2016 CLINICAL DATA:  81 year old female with left-sided chest pain for the past week EXAM: CHEST  2 VIEW COMPARISON:  Prior chest x-ray 11/23/2014 FINDINGS: Stable cardiac and mediastinal contours. Atherosclerotic calcifications present in the transverse aorta. Significantly increased  pulmonary vascular congestion with diffuse interstitial prominence throughout both lungs. Imaging findings are most consistent with mild pulmonary edema. The lungs remain hyperinflated with central bronchitic changes consistent with COPD. Small amount  of fluid tracks along the major and minor fissures. No pneumothorax. No acute osseous abnormality. IMPRESSION: Radiographic findings are most consistent with mild CHF. Background pulmonary parenchymal changes suggest underlying COPD. Aortic Atherosclerosis (ICD10-170.0) Electronically Signed   By: Malachy Moan M.D.   On: 08/04/2016 12:14   Nm Pulmonary Perf And Vent  Result Date: 08/04/2016 CLINICAL DATA:  Elevated D-dimer, shortness of breath, chest pain, symptoms for 1 week EXAM: NUCLEAR MEDICINE VENTILATION - PERFUSION LUNG SCAN TECHNIQUE: Ventilation images were obtained in multiple projections using inhaled aerosol Tc-62m DTPA. Perfusion images were obtained in multiple projections after intravenous injection of Tc-27m MAA. RADIOPHARMACEUTICALS:  30.2 mCi Technetium-14m DTPA aerosol inhalation and 4.1 mCi Technetium-72m MAA IV COMPARISON:  None Radiographic correlation:  Chest radiograph 08/04/2016 FINDINGS: Ventilation: Central airway deposition of tracer. Patchy areas of decreased perfusion in the RIGHT upper lobe and RIGHT middle lobe. Less well defined diminished ventilation at the apices. Perfusion: Diminished perfusion RIGHT mid lung, with overall better perfusion than ventilation. No additional segmental or subsegmental perfusion defects identified. Chest radiograph:  COPD changes with superimposed CHF IMPRESSION: Low probability for pulmonary embolism. Electronically Signed   By: Ulyses Southward M.D.   On: 08/04/2016 16:19    Assessment and Plan:   1. Shortness breath: This could be due to exacerbation of COPD but also could be congestive heart failure. An echocardiogram has been ordered and should be done today. Her BNP  is over 2000.  She has  been given Lasix and has diuresed 2.6 L is feeling a little bit better.  2. Chest pain: Her chest pain is somewhat atypical. It seems to be worse with lying back and better with leaning forward or standing up. She does not have a rub on exam to suggest pericarditis. There is not a  pleural  component to the pain. She does have a new left bundle-branch block compared to her previous EKG in 2016.  I've advised her to stop smoking.  We will schedule her for a Lexiscan Myoview study tomorrow.  Signed, Kristeen Miss, MD  08/05/2016 9:50 AM

## 2016-08-05 NOTE — Progress Notes (Signed)
@IPLOG @        PROGRESS NOTE                                                                                                                                                                                                             Patient Demographics:    Michele Walters, is a 81 y.o. female, DOB - [birth year], ZOX:[id number]  Admit date - 08/04/2016   Admitting Physician Leroy Sea, MD  Outpatient Primary MD for the patient is Bennie Pierini, FNP  LOS - 1  Chief Complaint  Patient presents with  . Chest Pain       Brief Narrative  Michele Walters  is a 81 y.o. female, History of hypertension, DM type II, dyslipidemia, hypothyroidism, smoking which is ongoing, CK D for baseline creatinine close to 1.5 who lives at home and comes to the hospital with 1 week history of left-sided chest pain which at times radiates to her left shoulder and arm, pain is dull, it is intermittent, usually brought in by her rest and lying flat and gets better with exertion. She has minimal shortness of breath associated with it and a mild cough productive of clear frothy sputum. She denies any fever chills, no exposure to sick contacts or recent travel. Came to the ER where she was diagnosed with CHF and I was called to admit.    Subjective:    [name] today has, No headache, No chest pain, No abdominal pain - No Nausea, No new weakness tingling or numbness, No Cough - Improved SOB.     Assessment  & Plan :     1. Intermittent left-sided chest pain with mild shortness of breath for 1 week -Had significantly elevated BNP upon admission suggesting acute on chronic nonspecific CHF, no previous echocardiogram on file, EKG did show left bundle branch block however her troponin 3 were unremarkable, her symptoms are somewhat atypical, she was treated with IV Lasix along with Nitropaste, Coreg, hydralazine and fluid and salt restriction. She is so far about 2.5 L negative and now relatively  symptom-free, shortness of breath and drains have improved. Continue diuresis. Echocardiogram pending. Seen by cardiology and will get a Lexiscan tomorrow as well.  2. 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 Close to her baseline creatinine of 1.5 we'll continue to monitor with diuresis, no ACE/ARB due to underlying renal insufficiency.  3. Dyslipidemia. On statin continue.  4. Essential hypertension. Continue on comminution of Coreg, hydralazine, Nitropaste and diuretic. Blood pressure now stable.    5.  Smoking. Counseled to quit, continue nicotine patch.  6. Hypothyroidism. Continue Synthroid TSH is stable.  7. Mildly elevated D Dimer upon admission - checked in the ER for SOB, -ve VQ, check Leg US, no leg swelling clinical suspicion for DVT very low.  8. DM type II. Check A1c, hold Glucophage, sliding scale.  Lab Results  Component Value Date   HGBA1C 6.0 03/08/2015   CBG (last 3)   Recent Labs  08/04/16 1057 08/04/16 2227 08/05/16 0731  GLUCAP 104* 134* 100*     Diet : Diet heart healthy/carb modified Room service appropriate? Yes; Fluid consistency: Thin; Fluid restriction: 1500 mL Fluid Diet NPO time specified    Family Communication  :  None  Code Status :  Full  Disposition Plan  :  TBD  Consults  :  Cards  Procedures  :    TTE  Lexiscan  DVT Prophylaxis  :  Lovenox   Lab Results  Component Value Date   PLT 270 08/04/2016    Inpatient Medications  Scheduled Meds: . aspirin  81 mg Oral Daily  . atorvastatin  40 mg Oral q1800  . calcium carbonate  1 tablet Oral Daily  . carvedilol  6.25 mg Oral BID WC  . enoxaparin (LOVENOX) injection  30 mg Subcutaneous Q24H  . furosemide  60 mg Intravenous Q12H  . hydrALAZINE  50 mg Oral Q8H  . insulin aspart  0-5 Units Subcutaneous QHS  . insulin aspart  0-9 Units Subcutaneous TID WC  . levothyroxine  88 mcg Oral QAC breakfast  . mouth rinse  15 mL Mouth Rinse BID  . nicotine  21 mg Transdermal Daily  . nitroGLYCERIN   0.5 inch Topical Q6H  . [START ON 08/06/2016] regadenoson  0.4 mg Intravenous Once  . sodium chloride flush  3 mL Intravenous Q12H   Continuous Infusions: . sodium chloride     PRN Meds:.sodium chloride, acetaminophen, meclizine, ondansetron (ZOFRAN) IV, sodium chloride flush  Antibiotics  :    Anti-infectives    None         Objective:   Vitals:   08/04/16 1700 08/04/16 1800 08/04/16 2052 08/05/16 0536  BP: (!) 170/92 (!) 157/75 135/68 127/71  Pulse: 68 63 69 67  Resp: (!) 22  20 18   Temp:   97.7 F (36.5 C) 97.5 F (36.4 C)  TempSrc:   Oral Oral  SpO2: 100% 94% 96% 98%  Weight:    54.1 kg (119 lb 4.3 oz)  Height:        Wt Readings from Last 3 Encounters:  08/05/16 54.1 kg (119 lb 4.3 oz)  07/10/16 56.2 kg (123 lb 12.8 oz)  05/07/16 55.3 kg (122 lb)     Intake/Output Summary (Last 24 hours) at 08/05/16 1110 Last data filed at 08/05/16 [id number]  Gross per 24 hour  Intake                0 ml  Output             2650 ml  Net            -2650 ml     Physical Exam  Awake Alert, Oriented X 3, No new F.N deficits, Normal affect [place].AT,PERRAL Supple Neck,No JVD, No cervical lymphadenopathy appriciated.  Symmetrical Chest wall movement, Good air movement bilaterally, Few crackles RRR,No Gallops,Rubs or new Murmurs, No Parasternal Heave +ve B.Sounds, Abd Soft, No tenderness, No organomegaly appriciated, No rebound - guarding or rigidity. No Cyanosis, Clubbing  or edema, No new Rash or bruise      Data Review:    CBC  Recent Labs Lab 08/04/16 1135  WBC 7.8  HGB 9.9*  HCT 27.6*  PLT 270  MCV 85.2  MCH 30.6  MCHC 35.9  RDW 15.5    Chemistries   Recent Labs Lab 08/04/16 1135 08/05/16 0512 08/05/16 0621  NA 138 138  --   K 4.5 3.8  --   CL 107 105  --   CO2 23 23  --   GLUCOSE 115* 86  --   BUN 37* 37*  --   CREATININE 1.74* 1.64*  --   CALCIUM 9.1 8.9  --   MG  --   --  1.7    ------------------------------------------------------------------------------------------------------------------ No results for input(s): CHOL, HDL, LDLCALC, TRIG, CHOLHDL, LDLDIRECT in the last 72 hours.  Lab Results  Component Value Date   HGBA1C 6.0 03/08/2015   ------------------------------------------------------------------------------------------------------------------  Recent Labs  08/04/16 2036  TSH 1.986   ------------------------------------------------------------------------------------------------------------------ No results for input(s): VITAMINB12, FOLATE, FERRITIN, TIBC, IRON, RETICCTPCT in the last 72 hours.  Coagulation profile  Recent Labs Lab 08/04/16 2036  INR 1.13     Recent Labs  08/04/16 1143  DDIMER 2.35*    Cardiac Enzymes  Recent Labs Lab 08/04/16 1729 08/04/16 2245 08/05/16 0512  TROPONINI <0.03 <0.03 0.03*   ------------------------------------------------------------------------------------------------------------------    Component Value Date/Time   BNP 2,738.8 (H) 08/04/2016 1253    Micro Results No results found for this or any previous visit (from the past 240 hour(s)).  Radiology Reports Dg Chest 2 View  Result Date: 08/04/2016 CLINICAL DATA:  81 year old female with left-sided chest pain for the past week EXAM: CHEST  2 VIEW COMPARISON:  Prior chest x-ray 11/23/2014 FINDINGS: Stable cardiac and mediastinal contours. Atherosclerotic calcifications present in the transverse aorta. Significantly increased pulmonary vascular congestion with diffuse interstitial prominence throughout both lungs. Imaging findings are most consistent with mild pulmonary edema. The lungs remain hyperinflated with central bronchitic changes consistent with COPD. Small amount of fluid tracks along the major and minor fissures. No pneumothorax. No acute osseous abnormality. IMPRESSION: Radiographic findings are most consistent with mild CHF.  Background pulmonary parenchymal changes suggest underlying COPD. Aortic Atherosclerosis (ICD10-170.0) Electronically Signed   By: Malachy Moan M.D.   On: 08/04/2016 12:14   Nm Pulmonary Perf And Vent  Result Date: 08/04/2016 CLINICAL DATA:  Elevated D-dimer, shortness of breath, chest pain, symptoms for 1 week EXAM: NUCLEAR MEDICINE VENTILATION - PERFUSION LUNG SCAN TECHNIQUE: Ventilation images were obtained in multiple projections using inhaled aerosol Tc-92m DTPA. Perfusion images were obtained in multiple projections after intravenous injection of Tc-88m MAA. RADIOPHARMACEUTICALS:  30.2 mCi Technetium-64m DTPA aerosol inhalation and 4.1 mCi Technetium-19m MAA IV COMPARISON:  None Radiographic correlation:  Chest radiograph 08/04/2016 FINDINGS: Ventilation: Central airway deposition of tracer. Patchy areas of decreased perfusion in the RIGHT upper lobe and RIGHT middle lobe. Less well defined diminished ventilation at the apices. Perfusion: Diminished perfusion RIGHT mid lung, with overall better perfusion than ventilation. No additional segmental or subsegmental perfusion defects identified. Chest radiograph:  COPD changes with superimposed CHF IMPRESSION: Low probability for pulmonary embolism. Electronically Signed   By: Ulyses Southward M.D.   On: 08/04/2016 16:19    Time Spent in minutes  30   Susa Raring M.D on 08/05/2016 at 11:10 AM  Between 7am to 7pm - Pager - [phone] ( page via [email or web address], text pages only, please mention full 10 digit call back  number). After 7pm go to [email or web address] - password [email or web address]

## 2016-08-05 NOTE — Progress Notes (Signed)
  Echocardiogram 2D Echocardiogram has been performed.  Leta JunglingCooper, Reeder Brisby M 08/05/2016, 12:06 PM

## 2016-08-05 NOTE — Progress Notes (Signed)
Called [facility] at [phone], spoke with Maralyn SagoSarah, [facility], will pick up at 0730, patient is to be NPO after MN.

## 2016-08-06 ENCOUNTER — Inpatient Hospital Stay (HOSPITAL_COMMUNITY): Payer: Medicare Other

## 2016-08-06 ENCOUNTER — Ambulatory Visit (HOSPITAL_COMMUNITY)
Admission: RE | Admit: 2016-08-06 | Discharge: 2016-08-06 | Disposition: A | Discharge status: Home / Self Care | Payer: Medicare Other | Source: Ambulatory Visit | Attending: Cardiovascular Disease | Admitting: Cardiovascular Disease

## 2016-08-06 DIAGNOSIS — R079 Chest pain, unspecified: Secondary | ICD-10-CM | POA: Insufficient documentation

## 2016-08-06 DIAGNOSIS — R9439 Abnormal result of other cardiovascular function study: Secondary | ICD-10-CM | POA: Insufficient documentation

## 2016-08-06 DIAGNOSIS — I517 Cardiomegaly: Secondary | ICD-10-CM

## 2016-08-06 DIAGNOSIS — I5021 Acute systolic (congestive) heart failure: Secondary | ICD-10-CM

## 2016-08-06 DIAGNOSIS — R0602 Shortness of breath: Secondary | ICD-10-CM

## 2016-08-06 LAB — GLUCOSE, CAPILLARY
GLUCOSE-CAPILLARY: 124 mg/dL — AB (ref 65–99)
GLUCOSE-CAPILLARY: 212 mg/dL — AB (ref 65–99)
Glucose-Capillary: 182 mg/dL — ABNORMAL HIGH (ref 65–99)
Glucose-Capillary: 131 mg/dL — ABNORMAL HIGH (ref 65–99)

## 2016-08-06 LAB — NM MYOCAR MULTI W/SPECT W/WALL MOTION / EF
CSEPED: 5 min
CSEPEDS: 13 s
CSEPPHR: 85 {beats}/min
Estimated workload: 1 METS
MPHR: 142 {beats}/min
Percent HR: 59 %
Rest HR: 72 {beats}/min

## 2016-08-06 LAB — BASIC METABOLIC PANEL
Anion gap: 12 (ref 5–15)
BUN: 47 mg/dL — AB (ref 6–20)
CHLORIDE: 102 mmol/L (ref 101–111)
CO2: 24 mmol/L (ref 22–32)
CREATININE: 2.25 mg/dL — AB (ref 0.44–1.00)
Calcium: 9.4 mg/dL (ref 8.9–10.3)
GFR calc Af Amer: 23 mL/min — ABNORMAL LOW (ref >60)
GFR calc non Af Amer: 20 mL/min — ABNORMAL LOW (ref >60)
GLUCOSE: 118 mg/dL — AB (ref 65–99)
POTASSIUM: 4.4 mmol/L (ref 3.5–5.1)
Sodium: 138 mmol/L (ref 135–145)

## 2016-08-06 LAB — HEMOGLOBIN A1C
Hgb A1c MFr Bld: 4.8 % (ref 4.8–5.6)
Mean Plasma Glucose: 91 mg/dL

## 2016-08-06 MED ORDER — REGADENOSON 0.4 MG/5ML IV SOLN
0.4000 mg | Freq: Once | INTRAVENOUS | Status: AC
Start: 2016-08-06 — End: 2016-08-06
  Administered 2016-08-06: 0.4 mg via INTRAVENOUS

## 2016-08-06 MED ORDER — REGADENOSON 0.4 MG/5ML IV SOLN
INTRAVENOUS | Status: AC
  Filled 2016-08-06: qty 5

## 2016-08-06 MED ORDER — FUROSEMIDE 10 MG/ML IJ SOLN: 60.0000 mg | Freq: Every day | INTRAMUSCULAR | Status: DC

## 2016-08-06 MED ORDER — TECHNETIUM TC 99M TETROFOSMIN IV KIT
30.0000 | PACK | Freq: Once | INTRAVENOUS | Status: AC | PRN
  Administered 2016-08-06: 30 via INTRAVENOUS

## 2016-08-06 MED ORDER — ISOSORBIDE MONONITRATE ER 30 MG PO TB24
30.0000 mg | ORAL_TABLET | Freq: Every day | ORAL | Status: DC
  Administered 2016-08-06 – 2016-08-08 (×3): 30 mg via ORAL
  Filled 2016-08-06 (×2): qty 1

## 2016-08-06 MED ORDER — TECHNETIUM TC 99M TETROFOSMIN IV KIT
10.0000 | PACK | Freq: Once | INTRAVENOUS | Status: AC | PRN
  Administered 2016-08-06: 10 via INTRAVENOUS

## 2016-08-06 NOTE — Plan of Care (Signed)
Problem: Safety: Goal: Ability to remain free from injury will improve Outcome: Completed/Met Date Met: 08/06/16 Bed alarm in place.

## 2016-08-06 NOTE — Discharge Instructions (Signed)
Follow with Primary MD Michele Walters, Mary-Margaret, FNP in 7 days   Get CBC, CMP, 2 view Chest X ray checked  by Primary MD or SNF MD in 5-7 days ( we routinely change or add medications that can affect your baseline labs and fluid status, therefore we recommend that you get the mentioned basic workup next visit with your PCP, your PCP may decide not to get them or add new tests based on their clinical decision)  Activity: As tolerated with Full fall precautions use walker/cane & assistance as needed  Disposition Home   Diet:  Heart Healthy    For Heart failure patients - Check your Weight same time everyday, if you gain over 2 pounds, or you develop in leg swelling, experience more shortness of breath or chest pain, call your Primary MD immediately. Follow Cardiac Low Salt Diet and 1.5 lit/day fluid restriction.  On your next visit with your primary care physician please Get Medicines reviewed and adjusted.  Please request your Prim.MD to go over all Hospital Tests and Procedure/Radiological results at the follow up, please get all Hospital records sent to your Prim MD by signing hospital release before you go home.  If you experience worsening of your admission symptoms, develop shortness of breath, life threatening emergency, suicidal or homicidal thoughts you must seek medical attention immediately by calling 911 or calling your MD immediately  if symptoms less severe.  You Must read complete instructions/literature along with all the possible adverse reactions/side effects for all the Medicines you take and that have been prescribed to you. Take any new Medicines after you have completely understood and accpet all the possible adverse reactions/side effects.   Do not drive, operate heavy machinery, perform activities at heights, swimming or participation in water activities or provide baby sitting services if your were admitted for syncope or siezures until you have seen by Primary MD or a  Neurologist and advised to do so again.  Do not drive when taking Pain medications.    Do not take more than prescribed Pain, Sleep and Anxiety Medications  Special Instructions: If you have smoked or chewed Tobacco  in the last 2 yrs please stop smoking, stop any regular Alcohol  and or any Recreational drug use.  Wear Seat belts while driving.   Please note  You were cared for by a hospitalist during your hospital stay. If you have any questions about your discharge medications or the care you received while you were in the hospital after you are discharged, you can call the unit and asked to speak with the hospitalist on call if the hospitalist that took care of you is not available. Once you are discharged, your primary care physician will handle any further medical issues. Please note that NO REFILLS for any discharge medications will be authorized once you are discharged, as it is imperative that you return to your primary care physician (or establish a relationship with a primary care physician if you do not have one) for your aftercare needs so that they can reassess your need for medications and monitor your lab values.

## 2016-08-06 NOTE — Progress Notes (Signed)
**  Preliminary report by tech**  Bilateral lower extremity venous duplex completed. There is no evidence of deep or superficial vein thrombosis involving the right and left lower extremities. All visualized vessels appear patent and compressible. There is no evidence of Baker's cysts bilaterally.  08/06/16 2:09 PM Olen CordialGreg Dean Goldner RVT

## 2016-08-06 NOTE — Progress Notes (Signed)
Patient presented for Lexiscan. Tolerated procedure well. Pending final stress imaging result.  

## 2016-08-06 NOTE — Progress Notes (Signed)
@IPLOG @        PROGRESS NOTE                                                                                                                                                                                                             Patient Demographics:    Michele Walters, is a 81 y.o. female, DOB - 05-May-1935, ZOX:[id number]  Admit date - 08/04/2016   Admitting Physician Leroy Sea, MD  Outpatient Primary MD for the patient is Bennie Pierini, FNP  LOS - 2  Chief Complaint  Patient presents with  . Chest Pain       Brief Narrative  Michele Walters  is a 81 y.o. female, History of hypertension, DM type II, dyslipidemia, hypothyroidism, smoking which is ongoing, CK D for baseline creatinine close to 1.5 who lives at home and comes to the hospital with 1 week history of left-sided chest pain which at times radiates to her left shoulder and arm, pain is dull, it is intermittent, usually brought in by her rest and lying flat and gets better with exertion. She has minimal shortness of breath associated with it and a mild cough productive of clear frothy sputum. She denies any fever chills, no exposure to sick contacts or recent travel. Came to the ER where she was diagnosed with CHF and I was called to admit.    Subjective:    Michele Walters today has, No headache, No chest pain, No abdominal pain - No Nausea, No new weakness tingling or numbness, No Cough - improved SOB.     Assessment  & Plan :    1. Intermittent left-sided chest pain with mild shortness of breath for 1 week, elevated proBNP, negative troponin .Patient has acute on chronic combined systolic and diastolic CHF EF 81% with evidence of moderate size reversible ischemia on Lexiscan suggestive of ischemic cardiomyopathy.  She has been placed on combination of Coreg, hydralazine, Nitropaste, diuretics and aspirin along with statin, currently symptoms much improved and she is chest pain-free, cardiology on board,  echocardiogram reviewed, Lexiscan shows moderate reversible ischemia for which she might require left heart cath once renal function has stabilized. Continue medical management for now.   2. ARF on CK D stage III to 4. Baseline creatinine close to 1.5, Continue to hold ACE/ARB, monitor with diuresis, renal function mildly worse, will check renal ultrasound as well, diuretic dose has been reduced.  3. Dyslipidemia. On statin continue..  4. Essential hypertension. Placed on combination of Coreg, hydralazine and Nitropaste along with  diuretic. That pressure stable we'll continue to monitor.  5. Smoking. Counseled to quit smoking, nicotine patch for now.  6. Hypothyroidism. Continue home dose Synthroid , stable TSH.  7. ++ D Dimer - checked in the ER for SOB, Negative V/Q and lower extremity venous ultrasound.  8. DM type II. Check A1c, hold Glucophage, on ISS.   Lab Results  Component Value Date   HGBA1C 4.8 08/04/2016   CBG (last 3)   Recent Labs  08/05/16 2329 08/06/16 0750 08/06/16 1211  GLUCAP 131* 124* 212*     Diet : Diet Heart Room service appropriate? Yes; Fluid consistency: Thin    Family Communication  :  sister  Code Status :  Full  Disposition Plan  :  TBD  Consults  :  Cards  Procedures  :    TTE    Left ventricle: The cavity size was normal. Wall thickness was increased in a pattern of moderate to severe LVH. There was focalbasal hypertrophy. Systolic function was moderately reduced. The estimated ejection fraction was in the range of 35% to 40%. Doppler parameters are consistent with abnormal left ventricular   relaxation (grade 1 diastolic dysfunction). - Aortic valve: There was very mild stenosis. Valve area (VTI): 1.29 cm^2. Valve area (Vmax): 1.3 cm^2. Valve area (Vmean): 1.35  cm^2. - Mitral valve: There was mild regurgitation. - Left atrium: The atrium was moderately dilated. - Tricuspid valve: There was mild-moderate regurgitation. -  Pulmonary arteries: Systolic pressure was moderately increased.   PA peak pressure: 44 mm Hg (S).   VQ - no significant VQ mismatch.  Lexiscan - moderate reversible ischemia.  Bilateral lower extremity venous duplex  There is no evidence of deep or superficial vein thrombosis involving the right and left lower extremities. All visualized vessels appear patent and compressible. There is no evidence of Baker's cysts bilaterally.    DVT Prophylaxis  :  Lovenox    Lab Results  Component Value Date   PLT 270 08/04/2016    Inpatient Medications  Scheduled Meds: . aspirin  81 mg Oral Daily  . atorvastatin  40 mg Oral q1800  . calcium carbonate  1 tablet Oral Daily  . carvedilol  6.25 mg Oral BID WC  . enoxaparin (LOVENOX) injection  30 mg Subcutaneous Q24H  . [START ON 08/07/2016] furosemide  60 mg Intravenous Daily  . hydrALAZINE  50 mg Oral Q8H  . insulin aspart  0-5 Units Subcutaneous QHS  . insulin aspart  0-9 Units Subcutaneous TID WC  . levothyroxine  88 mcg Oral QAC breakfast  . mouth rinse  15 mL Mouth Rinse BID  . nicotine  21 mg Transdermal Daily  . nitroGLYCERIN  0.5 inch Topical Q6H  . regadenoson  0.4 mg Intravenous Once  . sodium chloride flush  3 mL Intravenous Q12H   Continuous Infusions: . sodium chloride     PRN Meds:.sodium chloride, acetaminophen, meclizine, ondansetron (ZOFRAN) IV, sodium chloride flush  Antibiotics  :    Anti-infectives    None         Objective:   Vitals:   08/05/16 2031 08/06/16 0508 08/06/16 1214 08/06/16 1412  BP: 131/71 107/61 (!) 146/69 (!) 97/51  Pulse: 69 73  73  Resp: 18 18  18   Temp: 98 F (36.7 C) 98.2 F (36.8 C) 97.7 F (36.5 C) 97.8 F (36.6 C)  TempSrc: Oral Oral Oral Oral  SpO2: 98% 99% 96% 96%  Weight:  50.4 kg (111 lb 1.6 oz)  Height:        Wt Readings from Last 3 Encounters:  08/06/16 50.4 kg (111 lb 1.6 oz)  07/10/16 56.2 kg (123 lb 12.8 oz)  05/07/16 55.3 kg (122 lb)     Intake/Output  Summary (Last 24 hours) at 08/06/16 1626 Last data filed at 08/06/16 0800  Gross per 24 hour  Intake                0 ml  Output             1550 ml  Net            -1550 ml     Physical Exam  Awake Alert, Oriented X 3, No new F.N deficits, Normal affect [place].AT,PERRAL Supple Neck,No JVD, No cervical lymphadenopathy appriciated.  Symmetrical Chest wall movement, Good air movement bilaterally, few rales RRR,No Gallops,Rubs or new Murmurs, No Parasternal Heave +ve B.Sounds, Abd Soft, No tenderness, No organomegaly appriciated, No rebound - guarding or rigidity. No Cyanosis, Clubbing or edema, No new Rash or bruise       Data Review:    CBC  Recent Labs Lab 08/04/16 1135  WBC 7.8  HGB 9.9*  HCT 27.6*  PLT 270  MCV 85.2  MCH 30.6  MCHC 35.9  RDW 15.5    Chemistries   Recent Labs Lab 08/04/16 1135 08/05/16 0512 08/05/16 0621 08/06/16 0545  NA 138 138  --  138  K 4.5 3.8  --  4.4  CL 107 105  --  102  CO2 23 23  --  24  GLUCOSE 115* 86  --  118*  BUN 37* 37*  --  47*  CREATININE 1.74* 1.64*  --  2.25*  CALCIUM 9.1 8.9  --  9.4  MG  --   --  1.7  --    ------------------------------------------------------------------------------------------------------------------ No results for input(s): CHOL, HDL, LDLCALC, TRIG, CHOLHDL, LDLDIRECT in the last 72 hours.  Lab Results  Component Value Date   HGBA1C 4.8 08/04/2016   ------------------------------------------------------------------------------------------------------------------  Recent Labs  08/04/16 2036  TSH 1.986   ------------------------------------------------------------------------------------------------------------------ No results for input(s): VITAMINB12, FOLATE, FERRITIN, TIBC, IRON, RETICCTPCT in the last 72 hours.  Coagulation profile  Recent Labs Lab 08/04/16 2036  INR 1.13     Recent Labs  08/04/16 1143  DDIMER 2.35*    Cardiac Enzymes  Recent Labs Lab 08/04/16 1729  08/04/16 2245 08/05/16 0512  TROPONINI <0.03 <0.03 0.03*   ------------------------------------------------------------------------------------------------------------------    Component Value Date/Time   BNP 2,738.8 (H) 08/04/2016 1253    Micro Results No results found for this or any previous visit (from the past 240 hour(s)).  Radiology Reports Dg Chest 2 View  Result Date: 08/04/2016 CLINICAL DATA:  81 year old female with left-sided chest pain for the past week EXAM: CHEST  2 VIEW COMPARISON:  Prior chest x-ray 11/23/2014 FINDINGS: Stable cardiac and mediastinal contours. Atherosclerotic calcifications present in the transverse aorta. Significantly increased pulmonary vascular congestion with diffuse interstitial prominence throughout both lungs. Imaging findings are most consistent with mild pulmonary edema. The lungs remain hyperinflated with central bronchitic changes consistent with COPD. Small amount of fluid tracks along the major and minor fissures. No pneumothorax. No acute osseous abnormality. IMPRESSION: Radiographic findings are most consistent with mild CHF. Background pulmonary parenchymal changes suggest underlying COPD. Aortic Atherosclerosis (ICD10-170.0) Electronically Signed   By: Malachy MoanHeath  McCullough M.D.   On: 08/04/2016 12:14   Nm Myocar Multi W/spect W/wall Motion / Ef  Result Date: 08/06/2016  No T wave inversion was noted during stress.  Defect 1: There is a large defect of moderate severity present in the basal inferoseptal, mid anteroseptal, mid inferoseptal, mid inferior, apical septal and apical inferior location.  Nuclear stress EF: 39%.  Findings consistent with ischemia.  This is an intermediate risk study.  Mild RV uptake noted, suggesting elevated pulmonary pressure  Large size, moderate intensity partially reversible anteroseptal, septal and inferoseptal perfusion defect. Findings suggestive of ischemia or less likely partially reversible LBBB-related  defect. LVEF 39% with global hypokinesis and incoordinate septal motion. Mild RV tracer uptake noted. This is an intermediate risk study.   Nm Pulmonary Perf And Vent  Result Date: 08/04/2016 CLINICAL DATA:  Elevated D-dimer, shortness of breath, chest pain, symptoms for 1 week EXAM: NUCLEAR MEDICINE VENTILATION - PERFUSION LUNG SCAN TECHNIQUE: Ventilation images were obtained in multiple projections using inhaled aerosol Tc-96m DTPA. Perfusion images were obtained in multiple projections after intravenous injection of Tc-19m MAA. RADIOPHARMACEUTICALS:  30.2 mCi Technetium-73m DTPA aerosol inhalation and 4.1 mCi Technetium-59m MAA IV COMPARISON:  None Radiographic correlation:  Chest radiograph 08/04/2016 FINDINGS: Ventilation: Central airway deposition of tracer. Patchy areas of decreased perfusion in the RIGHT upper lobe and RIGHT middle lobe. Less well defined diminished ventilation at the apices. Perfusion: Diminished perfusion RIGHT mid lung, with overall better perfusion than ventilation. No additional segmental or subsegmental perfusion defects identified. Chest radiograph:  COPD changes with superimposed CHF IMPRESSION: Low probability for pulmonary embolism. Electronically Signed   By: Ulyses Southward M.D.   On: 08/04/2016 16:19    Time Spent in minutes  30   Susa Raring M.D on 08/06/2016 at 4:26 PM  Between 7am to 7pm - Pager - [phone] ( page via [email or web address], text pages only, please mention full 10 digit call back number). After 7pm go to [email or web address] - password [email or web address]

## 2016-08-06 NOTE — Progress Notes (Signed)
Reviewed patients myovue I am less impressed with reversible defect than interpreted Perfusion actually seems better in most segments on stress images. There is a small area Of reversibility in the basal inferolateral wall only seen on HLA images Given Cr over 2 and DM high risk of worsening renal function with dye load especially in setting of acute CHF Favor initial medical Rx with titration of meds. Now on coreg, hydralazine and nitrates. Will arrange outpatient F/u with Dr Nahser can consider cath once CHF stabilized or if medical Rx failure Some of her defects are Likely due to artifact and LBBB.      

## 2016-08-06 NOTE — Progress Notes (Signed)
Progress Note  Patient Name: KYLIE SIMMONDS Date of Encounter: 08/06/2016  Primary Cardiologist: Nahser  Subjective   Going to [facility] for myovue per Dr Elease Hashimoto   Inpatient Medications    Scheduled Meds: . aspirin  81 mg Oral Daily  . atorvastatin  40 mg Oral q1800  . calcium carbonate  1 tablet Oral Daily  . carvedilol  6.25 mg Oral BID WC  . enoxaparin (LOVENOX) injection  30 mg Subcutaneous Q24H  . furosemide  60 mg Intravenous Q12H  . hydrALAZINE  50 mg Oral Q8H  . insulin aspart  0-5 Units Subcutaneous QHS  . insulin aspart  0-9 Units Subcutaneous TID WC  . levothyroxine  88 mcg Oral QAC breakfast  . mouth rinse  15 mL Mouth Rinse BID  . nicotine  21 mg Transdermal Daily  . nitroGLYCERIN  0.5 inch Topical Q6H  . regadenoson  0.4 mg Intravenous Once  . sodium chloride flush  3 mL Intravenous Q12H   Continuous Infusions: . sodium chloride     PRN Meds: sodium chloride, acetaminophen, meclizine, ondansetron (ZOFRAN) IV, sodium chloride flush   Vital Signs    Vitals:   08/05/16 0536 08/05/16 1301 08/05/16 2031 08/06/16 0508  BP: 127/71 112/60 131/71 107/61  Pulse: 67 65 69 73  Resp: 18 18 18 18   Temp: 97.5 F (36.4 C) 97.7 F (36.5 C) 98 F (36.7 C) 98.2 F (36.8 C)  TempSrc: Oral Oral Oral Oral  SpO2: 98% 100% 98% 99%  Weight: 54.1 kg (119 lb 4.3 oz)   50.4 kg (111 lb 1.6 oz)  Height:        Intake/Output Summary (Last 24 hours) at 08/06/16 0823 Last data filed at 08/06/16 0747  Gross per 24 hour  Intake              240 ml  Output             1950 ml  Net            -1710 ml   Filed Weights   08/04/16 1051 08/05/16 0536 08/06/16 0508  Weight: 55.8 kg (123 lb) 54.1 kg (119 lb 4.3 oz) 50.4 kg (111 lb 1.6 oz)    Telemetry    NSR  - Personally Reviewed  ECG    NSR LBBB  - Personally Reviewed  Physical Exam   GEN: No acute distress.   Neck: No JVD Cardiac: RRR,SEM  murmurs, rubs, or gallops.  Respiratory: Clear to auscultation  bilaterally. GI: Soft, nontender, non-distended  MS: No edema; No deformity. Neuro:  Nonfocal  Psych: Normal affect   Labs    Chemistry Recent Labs Lab 08/04/16 1135 08/05/16 0512 08/06/16 0545  NA 138 138 138  K 4.5 3.8 4.4  CL 107 105 102  CO2 23 23 24   GLUCOSE 115* 86 118*  BUN 37* 37* 47*  CREATININE 1.74* 1.64* 2.25*  CALCIUM 9.1 8.9 9.4  GFRNONAA 27* 29* 20*  GFRAA 31* 33* 23*  ANIONGAP 8 10 12      Hematology Recent Labs Lab 08/04/16 1135  WBC 7.8  RBC 3.24*  HGB 9.9*  HCT 27.6*  MCV 85.2  MCH 30.6  MCHC 35.9  RDW 15.5  PLT 270    Cardiac Enzymes Recent Labs Lab 08/04/16 1729 08/04/16 2245 08/05/16 0512  TROPONINI <0.03 <0.03 0.03*    Recent Labs Lab 08/04/16 1141  TROPIPOC 0.03     BNP Recent Labs Lab 08/04/16 1253  BNP 2,738.8*  DDimer  Recent Labs Lab 08/04/16 1143  DDIMER 2.35*     Radiology    Dg Chest 2 View  Result Date: 08/04/2016 CLINICAL DATA:  81 year old female with left-sided chest pain for the past week EXAM: CHEST  2 VIEW COMPARISON:  Prior chest x-ray 11/23/2014 FINDINGS: Stable cardiac and mediastinal contours. Atherosclerotic calcifications present in the transverse aorta. Significantly increased pulmonary vascular congestion with diffuse interstitial prominence throughout both lungs. Imaging findings are most consistent with mild pulmonary edema. The lungs remain hyperinflated with central bronchitic changes consistent with COPD. Small amount of fluid tracks along the major and minor fissures. No pneumothorax. No acute osseous abnormality. IMPRESSION: Radiographic findings are most consistent with mild CHF. Background pulmonary parenchymal changes suggest underlying COPD. Aortic Atherosclerosis (ICD10-170.0) Electronically Signed   By: Malachy MoanHeath  McCullough M.D.   On: 08/04/2016 12:14   Nm Pulmonary Perf And Vent  Result Date: 08/04/2016 CLINICAL DATA:  Elevated D-dimer, shortness of breath, chest pain, symptoms for  1 week EXAM: NUCLEAR MEDICINE VENTILATION - PERFUSION LUNG SCAN TECHNIQUE: Ventilation images were obtained in multiple projections using inhaled aerosol Tc-4042m DTPA. Perfusion images were obtained in multiple projections after intravenous injection of Tc-6742m MAA. RADIOPHARMACEUTICALS:  30.2 mCi Technetium-10242m DTPA aerosol inhalation and 4.1 mCi Technetium-3542m MAA IV COMPARISON:  None Radiographic correlation:  Chest radiograph 08/04/2016 FINDINGS: Ventilation: Central airway deposition of tracer. Patchy areas of decreased perfusion in the RIGHT upper lobe and RIGHT middle lobe. Less well defined diminished ventilation at the apices. Perfusion: Diminished perfusion RIGHT mid lung, with overall better perfusion than ventilation. No additional segmental or subsegmental perfusion defects identified. Chest radiograph:  COPD changes with superimposed CHF IMPRESSION: Low probability for pulmonary embolism. Electronically Signed   By: Ulyses SouthwardMark  Boles M.D.   On: 08/04/2016 16:19    Cardiac Studies   Echo:  EF 35-40% moderate to severe LVH mild AS mild MR moderate LAE PA estimate 44 mmHg  Patient Profile     81 y.o. female admitted with SSCP and dyspnea. New LBBB R/O BNP elevated 2738 Echo with EF 35-40%  Progressive azotemia with Cr now 2.25   Assessment & Plan    DCM:  Not ideal cath candidate due to azotemia. For myovue today to risk stratify. Add nitrates to hydralazine Decrease lasix To daily follow Cr Hopefully myovue will be non ischemic  Signed, Charlton HawsPeter Mansel Strother, MD  08/06/2016, 8:23 AM

## 2016-08-07 LAB — GLUCOSE, CAPILLARY
GLUCOSE-CAPILLARY: 163 mg/dL — AB (ref 65–99)
Glucose-Capillary: 138 mg/dL — ABNORMAL HIGH (ref 65–99)
Glucose-Capillary: 296 mg/dL — ABNORMAL HIGH (ref 65–99)
Glucose-Capillary: 164 mg/dL — ABNORMAL HIGH (ref 65–99)

## 2016-08-07 LAB — BASIC METABOLIC PANEL
Anion gap: 12 (ref 5–15)
Anion gap: 12 (ref 5–15)
BUN: 55 mg/dL — AB (ref 6–20)
BUN: 53 mg/dL — ABNORMAL HIGH (ref 6–20)
CALCIUM: 9.3 mg/dL (ref 8.9–10.3)
CHLORIDE: 98 mmol/L — AB (ref 101–111)
CO2: 25 mmol/L (ref 22–32)
CO2: 25 mmol/L (ref 22–32)
CREATININE: 2.39 mg/dL — AB (ref 0.44–1.00)
CREATININE: 2.39 mg/dL — AB (ref 0.44–1.00)
Calcium: 9.3 mg/dL (ref 8.9–10.3)
Chloride: 97 mmol/L — ABNORMAL LOW (ref 101–111)
GFR calc Af Amer: 21 mL/min — ABNORMAL LOW (ref >60)
GFR calc non Af Amer: 18 mL/min — ABNORMAL LOW (ref >60)
GFR calc non Af Amer: 18 mL/min — ABNORMAL LOW (ref >60)
GFR, EST AFRICAN AMERICAN: 21 mL/min — AB (ref >60)
GLUCOSE: 133 mg/dL — AB (ref 65–99)
Glucose, Bld: 135 mg/dL — ABNORMAL HIGH (ref 65–99)
Potassium: 3.8 mmol/L (ref 3.5–5.1)
Potassium: 3.8 mmol/L (ref 3.5–5.1)
SODIUM: 135 mmol/L (ref 135–145)
Sodium: 134 mmol/L — ABNORMAL LOW (ref 135–145)

## 2016-08-07 MED ORDER — BISACODYL 5 MG PO TBEC
10.0000 mg | DELAYED_RELEASE_TABLET | Freq: Every day | ORAL | Status: DC
Start: 2016-08-07 — End: 2016-08-08
  Administered 2016-08-07 – 2016-08-08 (×2): 10 mg via ORAL
  Filled 2016-08-07 (×2): qty 2

## 2016-08-07 MED ORDER — ATORVASTATIN CALCIUM 40 MG PO TABS
80.0000 mg | ORAL_TABLET | Freq: Every day | ORAL | Status: DC
  Administered 2016-08-07: 80 mg via ORAL
  Filled 2016-08-07: qty 2

## 2016-08-07 MED ORDER — FUROSEMIDE 40 MG PO TABS
40.0000 mg | ORAL_TABLET | Freq: Every day | ORAL | Status: DC
  Administered 2016-08-07: 40 mg via ORAL
  Filled 2016-08-07: qty 1

## 2016-08-07 MED ORDER — POLYETHYLENE GLYCOL 3350 17 G PO PACK
17.0000 g | PACK | Freq: Two times a day (BID) | ORAL | Status: DC
  Administered 2016-08-07 – 2016-08-08 (×3): 17 g via ORAL
  Filled 2016-08-07 (×3): qty 1

## 2016-08-07 MED ORDER — POTASSIUM CHLORIDE CRYS ER 20 MEQ PO TBCR
20.0000 meq | EXTENDED_RELEASE_TABLET | Freq: Every day | ORAL | Status: AC
  Administered 2016-08-07 – 2016-08-08 (×2): 20 meq via ORAL
  Filled 2016-08-07 (×2): qty 1

## 2016-08-07 MED ORDER — DOCUSATE SODIUM 100 MG PO CAPS
200.0000 mg | ORAL_CAPSULE | Freq: Two times a day (BID) | ORAL | Status: DC
Start: 2016-08-07 — End: 2016-08-08
  Administered 2016-08-07 – 2016-08-08 (×3): 200 mg via ORAL
  Filled 2016-08-07 (×3): qty 2

## 2016-08-07 NOTE — Plan of Care (Signed)
Problem: Activity: Goal: Capacity to carry out activities will improve Outcome: Not Progressing Pt preferred to use [facility] instead of walking to [facility].

## 2016-08-07 NOTE — Progress Notes (Addendum)
Progress Note  Patient Name: Michele Walters Date of Encounter: 08/07/2016  Primary Cardiologist: New to Dr. Acie Fredrickson (she will likely follow up in [place] as she lives in [facility])  Subjective   Chest pain and dyspnea has been resolved.   Inpatient Medications    Scheduled Meds: . aspirin  81 mg Oral Daily  . atorvastatin  40 mg Oral q1800  . calcium carbonate  1 tablet Oral Daily  . carvedilol  6.25 mg Oral BID WC  . enoxaparin (LOVENOX) injection  30 mg Subcutaneous Q24H  . furosemide  40 mg Oral Daily  . hydrALAZINE  50 mg Oral Q8H  . insulin aspart  0-5 Units Subcutaneous QHS  . insulin aspart  0-9 Units Subcutaneous TID WC  . isosorbide mononitrate  30 mg Oral Daily  . levothyroxine  88 mcg Oral QAC breakfast  . mouth rinse  15 mL Mouth Rinse BID  . nicotine  21 mg Transdermal Daily  . potassium chloride  20 mEq Oral Daily  . regadenoson  0.4 mg Intravenous Once  . sodium chloride flush  3 mL Intravenous Q12H   Continuous Infusions: . sodium chloride     PRN Meds: sodium chloride, acetaminophen, meclizine, ondansetron (ZOFRAN) IV, sodium chloride flush   Vital Signs    Vitals:   08/06/16 1412 08/06/16 1700 08/06/16 2226 08/07/16 0547  BP: (!) 97/51 115/72 113/79 125/67  Pulse: 73 75 88 73  Resp: _0 Temp: 97.8 F (36.6 C)  98 F (36.7 C) 97.8 F (36.6 C)  TempSrc: Oral  Oral Oral  SpO2: 96%  95% 92%  Weight:    108 lb 3.9 oz (49.1 kg)  Height:       No intake or output data in the 24 hours ending 08/07/16 0949 Filed Weights   08/05/16 0536 08/06/16 0508 08/07/16 0547  Weight: 119 lb 4.3 oz (54.1 kg) 111 lb 1.6 oz (50.4 kg) 108 lb 3.9 oz (49.1 kg)    Telemetry    NSR  - Personally Reviewed  ECG    None today   Physical Exam   GEN: No acute distress.   Neck: No JVD Cardiac: RRR, no murmurs, rubs, or gallops.  Respiratory: Clear to auscultation bilaterally. GI: Soft, nontender, non-distended  MS: No edema; No  deformity. Neuro:  Nonfocal  Psych: Normal affect   Labs    Chemistry Recent Labs Lab 08/05/16 0512 08/06/16 0545 08/07/16 0807  NA 138 138 134*  135  K 3.8 4.4 3.8  3.8  CL 105 102 97*  98*  CO2 _1 GLUCOSE 86 118* 135*  133*  BUN 37* 47* 53*  55*  CREATININE 1.64* 2.25* 2.39*  2.39*  CALCIUM 8.9 9.4 9.3  9.3  GFRNONAA 29* 20* 18*  18*  GFRAA 33* 23* 21*  21*  ANIONGAP _2 Hematology Recent Labs Lab 08/04/16 1135  WBC 7.8  RBC 3.24*  HGB 9.9*  HCT 27.6*  MCV 85.2  MCH 30.6  MCHC 35.9  RDW 15.5  PLT 270    Cardiac Enzymes Recent Labs Lab 08/04/16 1729 08/04/16 2245 08/05/16 0512  TROPONINI <0.03 <0.03 0.03*    Recent Labs Lab 08/04/16 1141  TROPIPOC 0.03     BNP Recent Labs Lab 08/04/16 1253  BNP 2,738.8*     DDimer  Recent Labs Lab 08/04/16 1143  DDIMER 2.35*     Radiology  US Renal  Result Date: 08/06/2016 CLINICAL DATA:  Acute onset of renal insufficiency. Initial encounter. EXAM: RENAL / URINARY TRACT ULTRASOUND COMPLETE COMPARISON:  None. FINDINGS: Right Kidney: Length: 9.2 cm. Echogenicity within normal limits. No mass or hydronephrosis visualized. Left Kidney: Length: 10.1 cm. Echogenicity within normal limits. No mass or hydronephrosis visualized. Bladder: Decompressed and not well assessed. IMPRESSION: Unremarkable renal ultrasound.  No evidence of hydronephrosis. Electronically Signed   By: Garald Balding M.D.   On: 08/06/2016 21:39   Nm Myocar Multi W/spect W/wall Motion / Ef  Result Date: 08/06/2016  No T wave inversion was noted during stress.  Defect 1: There is a large defect of moderate severity present in the basal inferoseptal, mid anteroseptal, mid inferoseptal, mid inferior, apical septal and apical inferior location.  Nuclear stress EF: 39%.  Findings consistent with ischemia.  This is an intermediate risk study.  Mild RV uptake noted, suggesting elevated pulmonary pressure  Large  size, moderate intensity partially reversible anteroseptal, septal and inferoseptal perfusion defect. Findings suggestive of ischemia or less likely partially reversible LBBB-related defect. LVEF 39% with global hypokinesis and incoordinate septal motion. Mild RV tracer uptake noted. This is an intermediate risk study.    Cardiac Studies   Myoview  No T wave inversion was noted during stress.  Defect 1: There is a large defect of moderate severity present in the basal inferoseptal, mid anteroseptal, mid inferoseptal, mid inferior, apical septal and apical inferior location.  Nuclear stress EF: 39%.  Findings consistent with ischemia.  This is an intermediate risk study.  Mild RV uptake noted, suggesting elevated pulmonary pressure   Large size, moderate intensity partially reversible anteroseptal, septal and inferoseptal perfusion defect. Findings suggestive of ischemia or less likely partially reversible LBBB-related defect. LVEF 39% with global hypokinesis and incoordinate septal motion. Mild RV tracer uptake noted. This is an intermediate risk study.  Echo 08/05/16 Study Conclusions  - Left ventricle: The cavity size was normal. Wall thickness was   increased in a pattern of moderate to severe LVH. There was focal   basal hypertrophy. Systolic function was moderately reduced. The   estimated ejection fraction was in the range of 35% to 40%.   Doppler parameters are consistent with abnormal left ventricular   relaxation (grade 1 diastolic dysfunction). - Aortic valve: There was very mild stenosis. Valve area (VTI):   1.29 cm^2. Valve area (Vmax): 1.3 cm^2. Valve area (Vmean): 1.35   cm^2. - Mitral valve: There was mild regurgitation. - Left atrium: The atrium was moderately dilated. - Tricuspid valve: There was mild-moderate regurgitation. - Pulmonary arteries: Systolic pressure was moderately increased.   PA peak pressure: 44 mm Hg (S).  Patient Profile     81 y.o. 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   Assessment & Plan    1. Chest pain with high risk factors - Hx of intermittent chest pain radiating got left arm with SOB. Troponin negative x 3. EKG with new LBBB. Stress test as above. Per Dr. Johnsie Cancel there is a small area of reversibility in the basal inferolateral wall only seen on HLA images. Plan to treat medically given worsen kidney function and DM. Consider cath as outpatient once CHF stabilized or if medical Rx failure. Some of her defects are likely due to artifact and LBBB.  - Continue ASA, Statin and BB.   2.  Acute systolic CHF with concern of ICM - BNP elevated 2738.  Echo with EF 35-40%. Net I & O negative 4.3 L. She was treated with IV lasix 90m BID. Scr progressively worsen to 2.39 today from 1.74 on admission.  -Given PO lasix 450mtoday. Check BMET tomorrow. IF improved renal function, continue current dose. IF worsen, consider holding for few days and then resume at lasix 2053md. She actually looks dry.  - Continue medical therapy with coreg, hydralazine and nitrates. No ACE/ARB due to CKD.   3. HLD - 04/23/2016: Cholesterol, Total 129; HDL 45; LDL Calculated 62; Triglycerides 108  - Continue statin  4. HTN - BP stable  5. Acute on CKD, stage III-IV - AS above   Will sign off. Call with question. Will make follow up appointment next week.   SigJarrett SohoA  08/07/2016, 9:49 AM    Patient examined chart reviewed. See my note regarding myovue study. She is on dry side now Change to once daily PO lasix Will arrange outpatient f/u with either Dr HocPercival Spanish MadFerguson ReiMcCammonctors at patient request  Ok to d/c in am if Cr stable    PetJenkins Rouge

## 2016-08-07 NOTE — Progress Notes (Signed)
@IPLOG @        PROGRESS NOTE                                                                                                                                                                                                             Patient Demographics:    Michele Walters, is a 81 y.o. female, DOB - 1935-12-26, ZOX:[id number]  Admit date - 08/04/2016   Admitting Physician Leroy Sea, MD  Outpatient Primary MD for the patient is Bennie Pierini, FNP  LOS - 3  Chief Complaint  Patient presents with  . Chest Pain       Brief Narrative  Michele Walters  is a 81 y.o. female, History of hypertension, DM type II, dyslipidemia, hypothyroidism, smoking which is ongoing, CK D for baseline creatinine close to 1.5 who lives at home and comes to the hospital with 1 week history of left-sided chest pain which at times radiates to her left shoulder and arm, pain is dull, it is intermittent, usually brought in by her rest and lying flat and gets better with exertion. She has minimal shortness of breath associated with it and a mild cough productive of clear frothy sputum. She denies any fever chills, no exposure to sick contacts or recent travel. Came to the ER where she was diagnosed with CHF and I was called to admit.    Subjective:   Patient in bed, appears comfortable, denies any headache, no fever, no chest pain or pressure, no shortness of breath , no abdominal pain. No focal weakness.     Assessment  & Plan :    1. Intermittent left-sided chest pain with mild shortness of breath for 1 week, elevated proBNP, negative troponin .Patient has acute on chronic combined systolic and diastolic CHF EF 81% with evidence of moderate size reversible ischemia on Lexiscan suggestive of ischemic cardiomyopathy.  She has been placed on combination of Coreg, hydralazine, Imdur, diuretics and aspirin along with statin, currently symptoms much improved and she is chest pain-free.  She was seen by  cardiology, echocardiogram showed both systolic and diastolic CHF as above, she also had reversible ischemia small-to-moderate in size on Lexiscan, she likely has undiagnosed ischemic cardiomyopathy. For now she has improved and stabilized on above dictated medical regimen and currently symptom-free. Plan is if renal function remains stable to place her on 40 mg daily Lasix along with above dictated heart medications and then discharge her home with outpatient cardiology and PCP follow-up.   2. ARF on CK D stage III  to 4. Baseline creatinine close to 1.5, Continue to hold ACE/ARB/interest to, and ultrasound is stable, diuretic dose has been cut down to 40 mg daily orally, renal function seems to be plateauing, repeat BMP tomorrow if creatinine remains stable discharge her on 40 mg of Lasix with outpatient cardiology and renal follow-up.  3. Dyslipidemia. In poor control LDL above goal, statin dose maximized.  4. Essential hypertension. Placed on combination of Coreg, hydralazine and Imdur along with diuretic. That pressure stable we'll continue to monitor.  5. Smoking. Counseled to quit smoking, nicotine patch for now.  6. Hypothyroidism. Continue home dose Synthroid , stable TSH.  7. ++ D Dimer - checked in the ER for SOB, Negative V/Q and lower extremity venous ultrasound.  8. DM type II. Table A1c, here on sliding scale, resume home dose Glucophage upon discharge.   Lab Results  Component Value Date   HGBA1C 4.8 08/04/2016   CBG (last 3)   Recent Labs  08/06/16 1654 08/06/16 2312 08/07/16 0734  GLUCAP 182* 131* 138*     Diet : Diet Heart Room service appropriate? Yes; Fluid consistency: Thin    Family Communication  :  sister  Code Status :  Full  Disposition Plan  :  TBD  Consults  :  Cards  Procedures  :    TTE    Left ventricle: The cavity size was normal. Wall thickness was increased in a pattern of moderate to severe LVH. There was focalbasal hypertrophy.  Systolic function was moderately reduced. The estimated ejection fraction was in the range of 35% to 40%. Doppler parameters are consistent with abnormal left ventricular   relaxation (grade 1 diastolic dysfunction). - Aortic valve: There was very mild stenosis. Valve area (VTI): 1.29 cm^2. Valve area (Vmax): 1.3 cm^2. Valve area (Vmean): 1.35  cm^2. - Mitral valve: There was mild regurgitation. - Left atrium: The atrium was moderately dilated. - Tricuspid valve: There was mild-moderate regurgitation. - Pulmonary arteries: Systolic pressure was moderately increased.   PA peak pressure: 44 mm Hg (S).   VQ - no significant VQ mismatch.  Lexiscan - moderate reversible ischemia.  Bilateral lower extremity venous duplex  There is no evidence of deep or superficial vein thrombosis involving the right and left lower extremities. All visualized vessels appear patent and compressible. There is no evidence of Baker's cysts bilaterally.    DVT Prophylaxis  :  Lovenox    Lab Results  Component Value Date   PLT 270 08/04/2016    Inpatient Medications  Scheduled Meds: . aspirin  81 mg Oral Daily  . atorvastatin  40 mg Oral q1800  . calcium carbonate  1 tablet Oral Daily  . carvedilol  6.25 mg Oral BID WC  . enoxaparin (LOVENOX) injection  30 mg Subcutaneous Q24H  . furosemide  40 mg Oral Daily  . hydrALAZINE  50 mg Oral Q8H  . insulin aspart  0-5 Units Subcutaneous QHS  . insulin aspart  0-9 Units Subcutaneous TID WC  . isosorbide mononitrate  30 mg Oral Daily  . levothyroxine  88 mcg Oral QAC breakfast  . mouth rinse  15 mL Mouth Rinse BID  . nicotine  21 mg Transdermal Daily  . potassium chloride  20 mEq Oral Daily  . regadenoson  0.4 mg Intravenous Once  . sodium chloride flush  3 mL Intravenous Q12H   Continuous Infusions: . sodium chloride     PRN Meds:.sodium chloride, acetaminophen, meclizine, ondansetron (ZOFRAN) IV, sodium chloride flush  Antibiotics  :  Anti-infectives    None         Objective:   Vitals:   08/06/16 1412 08/06/16 1700 08/06/16 2226 08/07/16 0547  BP: (!) 97/51 115/72 113/79 125/67  Pulse: 73 75 88 73  Resp: 18  18 18   Temp: 97.8 F (36.6 C)  98 F (36.7 C) 97.8 F (36.6 C)  TempSrc: Oral  Oral Oral  SpO2: 96%  95% 92%  Weight:    49.1 kg (108 lb 3.9 oz)  Height:        Wt Readings from Last 3 Encounters:  08/07/16 49.1 kg (108 lb 3.9 oz)  07/10/16 56.2 kg (123 lb 12.8 oz)  05/07/16 55.3 kg (122 lb)    No intake or output data in the 24 hours ending 08/07/16 1116   Physical Exam  Awake Alert, Oriented X 3, No new F.N deficits, Normal affect .AT,PERRAL Supple Neck,No JVD, No cervical lymphadenopathy appriciated.  Symmetrical Chest wall movement, Good air movement bilaterally, few rales RRR,No Gallops,Rubs or new Murmurs, No Parasternal Heave +ve B.Sounds, Abd Soft, No tenderness, No organomegaly appriciated, No rebound - guarding or rigidity. No Cyanosis, Clubbing or edema, No new Rash or bruise   Data Review:    CBC  Recent Labs Lab 08/04/16 1135  WBC 7.8  HGB 9.9*  HCT 27.6*  PLT 270  MCV 85.2  MCH 30.6  MCHC 35.9  RDW 15.5    Chemistries   Recent Labs Lab 08/04/16 1135 08/05/16 0512 08/05/16 0621 08/06/16 0545 08/07/16 0807  NA 138 138  --  138 134*  135  K 4.5 3.8  --  4.4 3.8  3.8  CL 107 105  --  102 97*  98*  CO2 23 23  --  24 25  25   GLUCOSE 115* 86  --  118* 135*  133*  BUN 37* 37*  --  47* 53*  55*  CREATININE 1.74* 1.64*  --  2.25* 2.39*  2.39*  CALCIUM 9.1 8.9  --  9.4 9.3  9.3  MG  --   --  1.7  --   --    ------------------------------------------------------------------------------------------------------------------ No results for input(s): CHOL, HDL, LDLCALC, TRIG, CHOLHDL, LDLDIRECT in the last 72 hours.  Lab Results  Component Value Date   HGBA1C 4.8 08/04/2016    ------------------------------------------------------------------------------------------------------------------  Recent Labs  08/04/16 2036  TSH 1.986   ------------------------------------------------------------------------------------------------------------------ No results for input(s): VITAMINB12, FOLATE, FERRITIN, TIBC, IRON, RETICCTPCT in the last 72 hours.  Coagulation profile  Recent Labs Lab 08/04/16 2036  INR 1.13     Recent Labs  08/04/16 1143  DDIMER 2.35*    Cardiac Enzymes  Recent Labs Lab 08/04/16 1729 08/04/16 2245 08/05/16 0512  TROPONINI <0.03 <0.03 0.03*   ------------------------------------------------------------------------------------------------------------------    Component Value Date/Time   BNP 2,738.8 (H) 08/04/2016 1253    Micro Results No results found for this or any previous visit (from the past 240 hour(s)).  Radiology Reports Dg Chest 2 View  Result Date: 08/04/2016 CLINICAL DATA:  81 year old female with left-sided chest pain for the past week EXAM: CHEST  2 VIEW COMPARISON:  Prior chest x-ray 11/23/2014 FINDINGS: Stable cardiac and mediastinal contours. Atherosclerotic calcifications present in the transverse aorta. Significantly increased pulmonary vascular congestion with diffuse interstitial prominence throughout both lungs. Imaging findings are most consistent with mild pulmonary edema. The lungs remain hyperinflated with central bronchitic changes consistent with COPD. Small amount of fluid tracks along the major and minor fissures. No pneumothorax. No acute osseous  abnormality. IMPRESSION: Radiographic findings are most consistent with mild CHF. Background pulmonary parenchymal changes suggest underlying COPD. Aortic Atherosclerosis (ICD10-170.0) Electronically Signed   By: Malachy Moan M.D.   On: 08/04/2016 12:14   US Renal  Result Date: 08/06/2016 CLINICAL DATA:  Acute onset of renal insufficiency. Initial  encounter. EXAM: RENAL / URINARY TRACT ULTRASOUND COMPLETE COMPARISON:  None. FINDINGS: Right Kidney: Length: 9.2 cm. Echogenicity within normal limits. No mass or hydronephrosis visualized. Left Kidney: Length: 10.1 cm. Echogenicity within normal limits. No mass or hydronephrosis visualized. Bladder: Decompressed and not well assessed. IMPRESSION: Unremarkable renal ultrasound.  No evidence of hydronephrosis. Electronically Signed   By: Roanna Raider M.D.   On: 08/06/2016 21:39   Nm Myocar Multi W/spect W/wall Motion / Ef  Result Date: 08/06/2016  No T wave inversion was noted during stress.  Defect 1: There is a large defect of moderate severity present in the basal inferoseptal, mid anteroseptal, mid inferoseptal, mid inferior, apical septal and apical inferior location.  Nuclear stress EF: 39%.  Findings consistent with ischemia.  This is an intermediate risk study.  Mild RV uptake noted, suggesting elevated pulmonary pressure  Large size, moderate intensity partially reversible anteroseptal, septal and inferoseptal perfusion defect. Findings suggestive of ischemia or less likely partially reversible LBBB-related defect. LVEF 39% with global hypokinesis and incoordinate septal motion. Mild RV tracer uptake noted. This is an intermediate risk study.   Nm Pulmonary Perf And Vent  Result Date: 08/04/2016 CLINICAL DATA:  Elevated D-dimer, shortness of breath, chest pain, symptoms for 1 week EXAM: NUCLEAR MEDICINE VENTILATION - PERFUSION LUNG SCAN TECHNIQUE: Ventilation images were obtained in multiple projections using inhaled aerosol Tc-36m DTPA. Perfusion images were obtained in multiple projections after intravenous injection of Tc-20m MAA. RADIOPHARMACEUTICALS:  30.2 mCi Technetium-41m DTPA aerosol inhalation and 4.1 mCi Technetium-62m MAA IV COMPARISON:  None Radiographic correlation:  Chest radiograph 08/04/2016 FINDINGS: Ventilation: Central airway deposition of tracer. Patchy areas of decreased  perfusion in the RIGHT upper lobe and RIGHT middle lobe. Less well defined diminished ventilation at the apices. Perfusion: Diminished perfusion RIGHT mid lung, with overall better perfusion than ventilation. No additional segmental or subsegmental perfusion defects identified. Chest radiograph:  COPD changes with superimposed CHF IMPRESSION: Low probability for pulmonary embolism. Electronically Signed   By: Ulyses Southward M.D.   On: 08/04/2016 16:19    Time Spent in minutes  30   Susa Raring M.D on 08/07/2016 at 11:16 AM  Between 7am to 7pm - Pager - [phone] ( page via [email or web address], text pages only, please mention full 10 digit call back number). After 7pm go to [email or web address] - password [email or web address]

## 2016-08-08 DIAGNOSIS — I5041 Acute combined systolic (congestive) and diastolic (congestive) heart failure: Secondary | ICD-10-CM

## 2016-08-08 DIAGNOSIS — R079 Chest pain, unspecified: Secondary | ICD-10-CM

## 2016-08-08 LAB — BASIC METABOLIC PANEL
ANION GAP: 14 (ref 5–15)
BUN: 66 mg/dL — AB (ref 6–20)
CO2: 24 mmol/L (ref 22–32)
Calcium: 9.5 mg/dL (ref 8.9–10.3)
Chloride: 95 mmol/L — ABNORMAL LOW (ref 101–111)
Creatinine, Ser: 2.81 mg/dL — ABNORMAL HIGH (ref 0.44–1.00)
GFR calc Af Amer: 17 mL/min — ABNORMAL LOW (ref >60)
GFR, EST NON AFRICAN AMERICAN: 15 mL/min — AB (ref >60)
Glucose, Bld: 166 mg/dL — ABNORMAL HIGH (ref 65–99)
POTASSIUM: 4.3 mmol/L (ref 3.5–5.1)
SODIUM: 133 mmol/L — AB (ref 135–145)

## 2016-08-08 LAB — GLUCOSE, CAPILLARY: GLUCOSE-CAPILLARY: 155 mg/dL — AB (ref 65–99)

## 2016-08-08 MED ORDER — NICOTINE 21 MG/24HR TD PT24: 21.0000 mg | MEDICATED_PATCH | Freq: Every day | TRANSDERMAL | 0 refills | Status: DC

## 2016-08-08 MED ORDER — HYDRALAZINE HCL 50 MG PO TABS: 50.0000 mg | ORAL_TABLET | Freq: Three times a day (TID) | ORAL | 0 refills | Status: DC

## 2016-08-08 MED ORDER — ISOSORBIDE MONONITRATE ER 30 MG PO TB24: 30.0000 mg | ORAL_TABLET | Freq: Every day | ORAL | 0 refills | Status: AC

## 2016-08-08 MED ORDER — DOCUSATE SODIUM 100 MG PO CAPS: 200.0000 mg | ORAL_CAPSULE | Freq: Two times a day (BID) | ORAL | 0 refills | Status: DC

## 2016-08-08 MED ORDER — ATORVASTATIN CALCIUM 80 MG PO TABS: 80.0000 mg | ORAL_TABLET | Freq: Every day | ORAL | 0 refills | Status: DC

## 2016-08-08 MED ORDER — FUROSEMIDE 20 MG PO TABS: 20.0000 mg | ORAL_TABLET | Freq: Every day | ORAL | 0 refills | Status: DC

## 2016-08-08 MED ORDER — SENNA 8.6 MG PO TABS
1.0000 | ORAL_TABLET | Freq: Every day | ORAL | Status: DC
  Administered 2016-08-08: 8.6 mg via ORAL
  Filled 2016-08-08: qty 1

## 2016-08-08 MED ORDER — CARVEDILOL 6.25 MG PO TABS: 6.2500 mg | ORAL_TABLET | Freq: Two times a day (BID) | ORAL | 0 refills | Status: DC

## 2016-08-08 MED ORDER — POLYETHYLENE GLYCOL 3350 17 G PO PACK: 17.0000 g | PACK | Freq: Two times a day (BID) | ORAL | 0 refills | Status: DC

## 2016-08-08 MED ORDER — BISACODYL 5 MG PO TBEC: 10.0000 mg | DELAYED_RELEASE_TABLET | Freq: Every day | ORAL | 0 refills | Status: DC

## 2016-08-08 NOTE — Plan of Care (Signed)
Problem: Activity: Goal: Risk for activity intolerance will decrease Outcome: Not Progressing Pt encouraged by day and night nurses to ambulate. Pt resistant

## 2016-08-08 NOTE — Discharge Summary (Signed)
Physician Discharge Summary  Michele Walters OXB:[id number] DOB: 08/03/1935 DOA: 08/04/2016  PCP: Chevis Pretty, FNP  Admit date: 08/04/2016 Discharge date: 08/08/2016  Admitted From: Home Disposition:  Home  Recommendations for Outpatient Follow-up:  1. Follow up with PCP in 1 week 2. Follow up with Cardiology scheduled 7/10 in [place]  3. Will need repeat BMP next week to ensure stability of Cr. Per Cardiology recommendation, will hold lasix for 3 days, then resume at lower dose of 72m daily starting 08/11/16.   Discharge Condition: Stable CODE STATUS: Full  Diet recommendation: Heart healthy   Brief/Interim Summary: Michele Walters a 81y.o.female,history of hypertension, DM type II, dyslipidemia, hypothyroidism, smoking which is ongoing, CKD for baseline creatinine close to 1.5 who lives at home and comes to the hospital with 1 week history of left-sided chest pain which at times radiates to her left shoulder and arm, pain is dull, it is intermittent, usually brought in by her rest and lying flat and gets better with exertion. She has minimal shortness of breath associated with it and a mild cough productive of clear frothy sputum. She denies any fever chills, no exposure to sick contacts or recent travel.   Patient was admitted to the hospital for left-sided chest pain with acute on chronic heart failure. Cardiology was consulted at time of admission. She was given IV Lasix with good diuresis. Echocardiogram was completed which showed EF 368-34% grade 1 diastolic dysfunction. She also underwent Lexiscan myocardio perfusion testing which revealed small area of reversibility in the basal inferior lateral wall. Cardiology has recommended medical management, with follow-up in the outpatient setting for possible future outpatient workup once CHF stable.  Discharge Diagnoses:  Principal Problem:   Acute on chronic diastolic CHF (congestive heart failure) (HCC) Active Problems:   HTN  (hypertension)   Hypothyroidism   Hyperlipidemia   DM2 (diabetes mellitus, type 2) (HCC)   Kidney disease, chronic, stage III (moderate, EGFR 30-59 ml/min)   Smoker   Acute hypercapnic respiratory failure (HCC)   Acute congestive heart failure (HCC)   Chest pain   LBBB (left bundle branch block)  1. Acute on chronic systolic and diastolic heart failure as well as intermittent left-sided chest pain with mild shortness of breath for 1 week, elevated proBNP, negative troponin. Patient has acute on chronic combined systolic and diastolic CHF EF 319-62%with evidence of moderate size reversible ischemia on Lexiscan suggestive of ischemic cardiomyopathy.   She has been placed on combination of Coreg, hydralazine, Imdur, diuretics and aspirin along with statin, currently symptoms much improved and she is chest pain-free today.   She was seen by cardiology, echocardiogram showed both systolic and diastolic CHF as above, she also had reversible ischemia small-to-moderate in size on Lexiscan, she likely has undiagnosed ischemic cardiomyopathy. For now she has improved and stabilized on above dictated medical regimen and currently symptom-free.   She did have mildly worsening kidney function during hospitalization. As per cardiology recommendation, Lasix will be held for a few days and resumed at a lower dose of 20 mg daily. She will need close outpatient follow-up, repeat BMP.   2.ARF on CKD stage 3-4. Baseline creatinine close to 1.5, Continue to hold ACE/ARB and ultrasound is stable, diuretic dose has been cut down to 217mdaily to start 7/7.   3.Dyslipidemia. In poor control LDL above goal, statin dose maximized.  4.Essential hypertension. Placed on combination of Coreg, hydralazine and Imdur along with diuretic. BP stable   5.Smoking. Counseled to quit smoking, nicotine  patch prescribed at discharge   6.Hypothyroidism. Continue home dose Synthroid , stable TSH.  7.++ D Dimer -  checked in the ER due to SOB. Negative V/Q and lower extremity venous ultrasound.  8. DM type II, well controlled. Hold metformin due to AKI. Ha1c 4.8.    Discharge Instructions  Discharge Instructions    (HEART FAILURE PATIENTS) Call MD:  Anytime you have any of the following symptoms: 1) 3 pound weight gain in 24 hours or 5 pounds in 1 week 2) shortness of breath, with or without a dry hacking cough 3) swelling in the hands, feet or stomach 4) if you have to sleep on extra pillows at night in order to breathe.    Complete by:  As directed    Call MD for:  difficulty breathing, headache or visual disturbances    Complete by:  As directed    Call MD for:  extreme fatigue    Complete by:  As directed    Call MD for:  persistant dizziness or light-headedness    Complete by:  As directed    Call MD for:  persistant nausea and vomiting    Complete by:  As directed    Call MD for:  severe uncontrolled pain    Complete by:  As directed    Call MD for:  temperature >100.4    Complete by:  As directed    Diet - low sodium heart healthy    Complete by:  As directed    Discharge instructions    Complete by:  As directed    You were cared for by a hospitalist during your hospital stay. If you have any questions about your discharge medications or the care you received while you were in the hospital after you are discharged, you can call the unit and asked to speak with the hospitalist on call if the hospitalist that took care of you is not available. Once you are discharged, your primary care physician will handle any further medical issues. Please note that NO REFILLS for any discharge medications will be authorized once you are discharged, as it is imperative that you return to your primary care physician (or establish a relationship with a primary care physician if you do not have one) for your aftercare needs so that they can reassess your need for medications and monitor your lab values.    Increase activity slowly    Complete by:  As directed      Allergies as of 08/08/2016   No Known Allergies     Medication List    STOP taking these medications   amLODipine 5 MG tablet Commonly known as:  NORVASC   atenolol 50 MG tablet Commonly known as:  TENORMIN   ibuprofen 600 MG tablet Commonly known as:  ADVIL,MOTRIN   metFORMIN 500 MG 24 hr tablet Commonly known as:  GLUCOPHAGE-XR     TAKE these medications   aspirin 81 MG tablet Take 81 mg by mouth daily.   atorvastatin 80 MG tablet Commonly known as:  LIPITOR Take 1 tablet (80 mg total) by mouth daily at 6 PM. What changed:  medication strength  how much to take   bisacodyl 5 MG EC tablet Commonly known as:  DULCOLAX Take 2 tablets (10 mg total) by mouth daily. Start taking on:  08/09/2016   calcium carbonate 600 MG Tabs tablet Commonly known as:  OS-CAL Take 600 mg by mouth daily.   carvedilol 6.25 MG tablet Commonly known  as:  COREG Take 1 tablet (6.25 mg total) by mouth 2 (two) times daily with a meal.   docusate sodium 100 MG capsule Commonly known as:  COLACE Take 2 capsules (200 mg total) by mouth 2 (two) times daily.   Fish Oil 1000 MG Caps Take 1 capsule by mouth daily.   furosemide 20 MG tablet Commonly known as:  LASIX Take 1 tablet (20 mg total) by mouth daily. Start taking on:  08/11/2016   hydrALAZINE 50 MG tablet Commonly known as:  APRESOLINE Take 1 tablet (50 mg total) by mouth every 8 (eight) hours.   isosorbide mononitrate 30 MG 24 hr tablet Commonly known as:  IMDUR Take 1 tablet (30 mg total) by mouth daily. Start taking on:  08/09/2016   levothyroxine 88 MCG tablet Commonly known as:  SYNTHROID, LEVOTHROID Take 1 tablet (88 mcg total) by mouth daily before breakfast.   meclizine 25 MG tablet Commonly known as:  ANTIVERT Take 1 tablet (25 mg total) by mouth 4 (four) times daily as needed for dizziness.   nicotine 21 mg/24hr patch Commonly known as:  NICODERM CQ -  dosed in mg/24 hours Place 1 patch (21 mg total) onto the skin daily. Start taking on:  4/0/8144   [facility] DELICA LANCETS 81E Misc Use to check BG once a day.  Dx:E11.9 type 2 DM.   ONETOUCH VERIO test strip Generic drug:  glucose blood TEST ONCE A DAY   polyethylene glycol packet Commonly known as:  MIRALAX / GLYCOLAX Take 17 g by mouth 2 (two) times daily.      Follow-up Information    Chevis Pretty, FNP. Schedule an appointment as soon as possible for a visit in 1 week(s).   Specialty:  Family Medicine Contact information: [place] [address] [phone]        Lendon Colonel, NP. Go on 08/14/2016.   Specialties:  Nurse Practitioner, Radiology, Cardiology Why:  @3 :30 for post hospital cardiology follow up. please arrive 15 minutes early  Contact information: [place] [address] [phone]          No Known Allergies  Consultations:  Cardiology    Procedures/Studies: Dg Chest 2 View  Result Date: 08/04/2016 CLINICAL DATA:  81 year old female with left-sided chest pain for the past week EXAM: CHEST  2 VIEW COMPARISON:  Prior chest x-ray 11/23/2014 FINDINGS: Stable cardiac and mediastinal contours. Atherosclerotic calcifications present in the transverse aorta. Significantly increased pulmonary vascular congestion with diffuse interstitial prominence throughout both lungs. Imaging findings are most consistent with mild pulmonary edema. The lungs remain hyperinflated with central bronchitic changes consistent with COPD. Small amount of fluid tracks along the major and minor fissures. No pneumothorax. No acute osseous abnormality. IMPRESSION: Radiographic findings are most consistent with mild CHF. Background pulmonary parenchymal changes suggest underlying COPD. Aortic Atherosclerosis (ICD10-170.0) Electronically Signed   By: Jacqulynn Cadet M.D.   On: 08/04/2016 12:14   US Renal  Result Date:  08/06/2016 CLINICAL DATA:  Acute onset of renal insufficiency. Initial encounter. EXAM: RENAL / URINARY TRACT ULTRASOUND COMPLETE COMPARISON:  None. FINDINGS: Right Kidney: Length: 9.2 cm. Echogenicity within normal limits. No mass or hydronephrosis visualized. Left Kidney: Length: 10.1 cm. Echogenicity within normal limits. No mass or hydronephrosis visualized. Bladder: Decompressed and not well assessed. IMPRESSION: Unremarkable renal ultrasound.  No evidence of hydronephrosis. Electronically Signed   By: Garald Balding M.D.   On: 08/06/2016 21:39   Nm Myocar Multi W/spect W/wall Motion /  Ef  Result Date: 08/06/2016  No T wave inversion was noted during stress.  Defect 1: There is a large defect of moderate severity present in the basal inferoseptal, mid anteroseptal, mid inferoseptal, mid inferior, apical septal and apical inferior location.  Nuclear stress EF: 39%.  Findings consistent with ischemia.  This is an intermediate risk study.  Mild RV uptake noted, suggesting elevated pulmonary pressure  Large size, moderate intensity partially reversible anteroseptal, septal and inferoseptal perfusion defect. Findings suggestive of ischemia or less likely partially reversible LBBB-related defect. LVEF 39% with global hypokinesis and incoordinate septal motion. Mild RV tracer uptake noted. This is an intermediate risk study.   Nm Pulmonary Perf And Vent  Result Date: 08/04/2016 CLINICAL DATA:  Elevated D-dimer, shortness of breath, chest pain, symptoms for 1 week EXAM: NUCLEAR MEDICINE VENTILATION - PERFUSION LUNG SCAN TECHNIQUE: Ventilation images were obtained in multiple projections using inhaled aerosol Tc-41mDTPA. Perfusion images were obtained in multiple projections after intravenous injection of Tc-971mAA. RADIOPHARMACEUTICALS:  30.2 mCi Technetium-9918mPA aerosol inhalation and 4.1 mCi Technetium-27m25m IV COMPARISON:  None Radiographic correlation:  Chest radiograph 08/04/2016 FINDINGS:  Ventilation: Central airway deposition of tracer. Patchy areas of decreased perfusion in the RIGHT upper lobe and RIGHT middle lobe. Less well defined diminished ventilation at the apices. Perfusion: Diminished perfusion RIGHT mid lung, with overall better perfusion than ventilation. No additional segmental or subsegmental perfusion defects identified. Chest radiograph:  COPD changes with superimposed CHF IMPRESSION: Low probability for pulmonary embolism. Electronically Signed   By: MarkLavonia Dana.   On: 08/04/2016 16:19    Echo 7/1  Study Conclusions  - Left ventricle: The cavity size was normal. Wall thickness was   increased in a pattern of moderate to severe LVH. There was focal   basal hypertrophy. Systolic function was moderately reduced. The   estimated ejection fraction was in the range of 35% to 40%.   Doppler parameters are consistent with abnormal left ventricular   relaxation (grade 1 diastolic dysfunction). - Aortic valve: There was very mild stenosis. Valve area (VTI):   1.29 cm^2. Valve area (Vmax): 1.3 cm^2. Valve area (Vmean): 1.35   cm^2. - Mitral valve: There was mild regurgitation. - Left atrium: The atrium was moderately dilated. - Tricuspid valve: There was mild-moderate regurgitation. - Pulmonary arteries: Systolic pressure was moderately increased. PA peak pressure: 44 mm Hg (S).   Vascular US 7[place] Summary: - No evidence of deep vein or superficial thrombosis involving the   right lower extremity and left lower extremity. - No evidence of Baker&'s cyst on the right or left.   Discharge Exam: Vitals:   08/07/16 2158 08/08/16 0619  BP: 136/68 123/61  Pulse: 84 72  Resp: 18 20  Temp: 98.8 F (37.1 C) 98.1 F (36.7 C)   Vitals:   08/07/16 0547 08/07/16 1352 08/07/16 2158 08/08/16 0619  BP: 125/67 (!) 105/57 136/68 123/61  Pulse: 73 73 84 72  Resp: 18 18 18 20   Temp: 97.8 F (36.6 C) 97.6 F (36.4 C) 98.8 F (37.1 C) 98.1 F (36.7 C)  TempSrc: Oral  Oral Oral Oral  SpO2: 92% 94% 93% 93%  Weight: 49.1 kg (108 lb 3.9 oz)   50.8 kg (111 lb 14.4 oz)  Height:        General: Pt is alert, awake, not in acute distress Cardiovascular: RRR, S1/S2 +, no rubs, no gallops Respiratory: CTA bilaterally, no wheezing, no rhonchi Abdominal: Soft, NT, ND, bowel sounds + Extremities:  no edema, no cyanosis    The results of significant diagnostics from this hospitalization (including imaging, microbiology, ancillary and laboratory) are listed below for reference.     Microbiology: No results found for this or any previous visit (from the past 240 hour(s)).   Labs: BNP (last 3 results)  Recent Labs  08/04/16 1253  BNP 0,932.3*   Basic Metabolic Panel:  Recent Labs Lab 08/04/16 1135 08/05/16 0512 08/05/16 0621 08/06/16 0545 08/07/16 0807 08/08/16 0850  NA 138 138  --  138 134*  135 133*  K 4.5 3.8  --  4.4 3.8  3.8 4.3  CL 107 105  --  102 97*  98* 95*  CO2 23 23  --  24 25  25 24   GLUCOSE 115* 86  --  118* 135*  133* 166*  BUN 37* 37*  --  47* 53*  55* 66*  CREATININE 1.74* 1.64*  --  2.25* 2.39*  2.39* 2.81*  CALCIUM 9.1 8.9  --  9.4 9.3  9.3 9.5  MG  --   --  1.7  --   --   --    Liver Function Tests: No results for input(s): AST, ALT, ALKPHOS, BILITOT, PROT, ALBUMIN in the last 168 hours. No results for input(s): LIPASE, AMYLASE in the last 168 hours. No results for input(s): AMMONIA in the last 168 hours. CBC:  Recent Labs Lab 08/04/16 1135  WBC 7.8  HGB 9.9*  HCT 27.6*  MCV 85.2  PLT 270   Cardiac Enzymes:  Recent Labs Lab 08/04/16 1729 08/04/16 2245 08/05/16 0512  TROPONINI <0.03 <0.03 0.03*   BNP: Invalid input(s): POCBNP CBG:  Recent Labs Lab 08/07/16 0734 08/07/16 1212 08/07/16 1642 08/07/16 2156 08/08/16 0816  GLUCAP 138* 163* 296* 164* 155*   D-Dimer No results for input(s): DDIMER in the last 72 hours. Hgb A1c No results for input(s): HGBA1C in the last 72 hours. Lipid  Profile No results for input(s): CHOL, HDL, LDLCALC, TRIG, CHOLHDL, LDLDIRECT in the last 72 hours. Thyroid function studies No results for input(s): TSH, T4TOTAL, T3FREE, THYROIDAB in the last 72 hours.  Invalid input(s): FREET3 Anemia work up No results for input(s): VITAMINB12, FOLATE, FERRITIN, TIBC, IRON, RETICCTPCT in the last 72 hours. Urinalysis No results found for: COLORURINE, APPEARANCEUR, LABSPEC, [place], GLUCOSEU, HGBUR, BILIRUBINUR, KETONESUR, PROTEINUR, UROBILINOGEN, NITRITE, LEUKOCYTESUR Sepsis Labs Invalid input(s): PROCALCITONIN,  WBC,  LACTICIDVEN Microbiology No results found for this or any previous visit (from the past 240 hour(s)).   Time coordinating discharge: 45 minutes  SIGNED:  Dessa Phi, DO [facility] Pager [phone]  If 7PM-7AM, please contact night-coverage [email or web address] Password [email or web address] 08/08/2016, 1:39 PM

## 2016-08-08 NOTE — Progress Notes (Signed)
Went over discharge paperwork with patient and family.  All questions answered.  VSS.  Pt walking with no assistance in hall, not PT needed.

## 2016-08-08 NOTE — Progress Notes (Signed)
Progress Note  Patient Name: Michele Walters Date of Encounter: 08/08/2016  Primary Cardiologist: new , Nahser   Subjective   81 y.o. female with a hx of HTN, DM type 2, hypothyroidism  who is being seen today for the evaluation of increasing dyspnea  chest pain , arm pain  at the request of Dr. Thedore Mins.  Since I saw her several days ago, troponins are negative Creatinine has increased to 2.4  myoview - I have reviewed images - I agree with Dr. Fabio Bering intrepretation .  There is no significant reversible defect. EF 39% by myoview Echo on 7/1 confirms EF 35-40%.  Severe LVH with grade 1 diastolic dysfunction Moderate pulm HTN   I/O = -4.4 liters so far this admission   Inpatient Medications    Scheduled Meds: . aspirin  81 mg Oral Daily  . atorvastatin  80 mg Oral q1800  . bisacodyl  10 mg Oral Daily  . calcium carbonate  1 tablet Oral Daily  . carvedilol  6.25 mg Oral BID WC  . docusate sodium  200 mg Oral BID  . enoxaparin (LOVENOX) injection  30 mg Subcutaneous Q24H  . furosemide  40 mg Oral Daily  . hydrALAZINE  50 mg Oral Q8H  . insulin aspart  0-5 Units Subcutaneous QHS  . insulin aspart  0-9 Units Subcutaneous TID WC  . isosorbide mononitrate  30 mg Oral Daily  . levothyroxine  88 mcg Oral QAC breakfast  . mouth rinse  15 mL Mouth Rinse BID  . nicotine  21 mg Transdermal Daily  . polyethylene glycol  17 g Oral BID  . potassium chloride  20 mEq Oral Daily  . regadenoson  0.4 mg Intravenous Once  . sodium chloride flush  3 mL Intravenous Q12H   Continuous Infusions: . sodium chloride     PRN Meds: sodium chloride, acetaminophen, meclizine, ondansetron (ZOFRAN) IV, sodium chloride flush   Vital Signs    Vitals:   08/07/16 0547 08/07/16 1352 08/07/16 2158 08/08/16 0619  BP: 125/67 (!) 105/57 136/68 123/61  Pulse: 73 73 84 72  Resp: 18 18 18 20   Temp: 97.8 F (36.6 C) 97.6 F (36.4 C) 98.8 F (37.1 C) 98.1 F (36.7 C)  TempSrc: Oral Oral Oral Oral    SpO2: 92% 94% 93% 93%  Weight: 108 lb 3.9 oz (49.1 kg)   111 lb 14.4 oz (50.8 kg)  Height:        Intake/Output Summary (Last 24 hours) at 08/08/16 0714 Last data filed at 08/08/16 1610  Gross per 24 hour  Intake                0 ml  Output              100 ml  Net             -100 ml   Filed Weights   08/06/16 0508 08/07/16 0547 08/08/16 0619  Weight: 111 lb 1.6 oz (50.4 kg) 108 lb 3.9 oz (49.1 kg) 111 lb 14.4 oz (50.8 kg)    Telemetry      NSR, LBBB Personally Reviewed  ECG     NSR , LBBB - Personally Reviewed  Physical Exam    GEN:  elderly, thin female   Neck: No JVD Cardiac: RRR, no murmurs, rubs, or gallops.  Respiratory: few rhonchi ( smokers cough) , partially cleared with cough.  GI: Soft, nontender, non-distended  MS: No edema; No deformity. Neuro:  Nonfocal  Psych: Normal affect   Labs    Chemistry Recent Labs Lab 08/05/16 0512 08/06/16 0545 08/07/16 0807  NA 138 138 134*  135  K 3.8 4.4 3.8  3.8  CL 105 102 97*  98*  CO2 23 24 25  25   GLUCOSE 86 118* 135*  133*  BUN 37* 47* 53*  55*  CREATININE 1.64* 2.25* 2.39*  2.39*  CALCIUM 8.9 9.4 9.3  9.3  GFRNONAA 29* 20* 18*  18*  GFRAA 33* 23* 21*  21*  ANIONGAP 10 12 12  12      Hematology Recent Labs Lab 08/04/16 1135  WBC 7.8  RBC 3.24*  HGB 9.9*  HCT 27.6*  MCV 85.2  MCH 30.6  MCHC 35.9  RDW 15.5  PLT 270    Cardiac Enzymes Recent Labs Lab 08/04/16 1729 08/04/16 2245 08/05/16 0512  TROPONINI <0.03 <0.03 0.03*    Recent Labs Lab 08/04/16 1141  TROPIPOC 0.03     BNP Recent Labs Lab 08/04/16 1253  BNP 2,738.8*     DDimer  Recent Labs Lab 08/04/16 1143  DDIMER 2.35*     Radiology    US Renal  Result Date: 08/06/2016 CLINICAL DATA:  Acute onset of renal insufficiency. Initial encounter. EXAM: RENAL / URINARY TRACT ULTRASOUND COMPLETE COMPARISON:  None. FINDINGS: Right Kidney: Length: 9.2 cm. Echogenicity within normal limits. No mass or  hydronephrosis visualized. Left Kidney: Length: 10.1 cm. Echogenicity within normal limits. No mass or hydronephrosis visualized. Bladder: Decompressed and not well assessed. IMPRESSION: Unremarkable renal ultrasound.  No evidence of hydronephrosis. Electronically Signed   By: Roanna Raider M.D.   On: 08/06/2016 21:39   Nm Myocar Multi W/spect W/wall Motion / Ef  Result Date: 08/06/2016  No T wave inversion was noted during stress.  Defect 1: There is a large defect of moderate severity present in the basal inferoseptal, mid anteroseptal, mid inferoseptal, mid inferior, apical septal and apical inferior location.  Nuclear stress EF: 39%.  Findings consistent with ischemia.  This is an intermediate risk study.  Mild RV uptake noted, suggesting elevated pulmonary pressure  Large size, moderate intensity partially reversible anteroseptal, septal and inferoseptal perfusion defect. Findings suggestive of ischemia or less likely partially reversible LBBB-related defect. LVEF 39% with global hypokinesis and incoordinate septal motion. Mild RV tracer uptake noted. This is an intermediate risk study.    Cardiac Studies      Patient Profile     81 y.o. female , with long hx of cigarette smoking. COPD, LBBB, CHF   Assessment & Plan    1.  Acute on chronic combined systolic and diastolic CHF:   She is breathing better.   Her Creatinine is 2.39 after diuresing 4.4 liters.  This is likely  ( or close to ) her baseline. EF is 35-40%.   On Coreg 6.25 BID, Lasix 40 a day / Kdur 20 meq a day  Imdur 30 mg a day  Not on ACE, ARB or Aldactone due to renal insufficiency.  Will have her ambulate today . If she does well, she could be discharged today.  She has done well on current meds.  Would like to follow up in our [place], [place] or [place] office.   2.   Chest pain:   Troponins have been negative.   The presenting CP was somewhat atypical.  No real angina symptoms.   Given her 50+ years of  smoking, she likely has some CAD but I think that medical management is best  at this time.  She has CKD and DM and a cath would have a high risk of contrast induced nephropathy.  If she starts to have symptoms of Unstable angina, we can consider cath at that time. Will ambulate today .  If she does well, she could go home. Have advised smoking cessation.   3.  CKD:  Management per IM        Signed, Kristeen MissPhilip Nahser, MD  08/08/2016, 7:14 AM

## 2016-08-09 ENCOUNTER — Telehealth: Payer: Self-pay | Admitting: *Deleted

## 2016-08-09 NOTE — Telephone Encounter (Signed)
Attempted to contact for TCM. Home phone was busy. Will try again later.

## 2016-08-14 ENCOUNTER — Ambulatory Visit (INDEPENDENT_AMBULATORY_CARE_PROVIDER_SITE_OTHER): Payer: Medicare Other | Admitting: Adult Health

## 2016-08-14 ENCOUNTER — Encounter: Payer: Self-pay | Admitting: Adult Health

## 2016-08-14 VITALS — BP 112/60 | HR 78 | Ht 62.0 in | Wt 113.0 lb

## 2016-08-14 DIAGNOSIS — I10 Essential (primary) hypertension: Secondary | ICD-10-CM | POA: Diagnosis not present

## 2016-08-14 DIAGNOSIS — I5042 Chronic combined systolic (congestive) and diastolic (congestive) heart failure: Secondary | ICD-10-CM

## 2016-08-14 DIAGNOSIS — Z720 Tobacco use: Secondary | ICD-10-CM

## 2016-08-14 DIAGNOSIS — I43 Cardiomyopathy in diseases classified elsewhere: Secondary | ICD-10-CM | POA: Diagnosis not present

## 2016-08-14 NOTE — Patient Instructions (Signed)
Your physician recommends that you schedule a follow-up appointment in: 1 Month   Your physician recommends that you continue on your current medications as directed. Please refer to the Current Medication list given to you today. Restart Lasix   Your physician recommends that you return for lab work in: 1 week   If you need a refill on your cardiac medications before your next appointment, please call your pharmacy.  Thank you for choosing  [facility]!

## 2016-08-14 NOTE — Progress Notes (Signed)
Cardiology Office Note   Date:  08/14/2016   ID:  Michele Walters, DOB 10/25/1935, MRN [id number]  PCP:  Bennie PieriniMartin, Mary-Margaret, FNP  Cardiologist: Nahser   Chief Complaint  Patient presents with  . Hypertension  . Chest Pain      History of Present Illness: Michele Walters is a 81 y.o. female who presents for ongoing assessment and management of hypertension, with  Type 2 diabetes, hypothyroidism, who was seen by Dr. Elease HashimotoNahser for increasing dyspnea, chest pain, and arm pain. Onset of or 2018 the patient was diagnosed with acute on chronic diastolic and systolic CHF. Most recent echocardiogram revealed an EF of 35-40%. She was continued on carvedilol 6.25 mg twice a day, Lasix 40 mg daily, potassium 20 mg once daily, Imdur 30 mg daily. She was not placed on an Ace, ARB, or Aldactone due to renal insufficiency.  For complaints of chest pain, the patient's troponins were found to be negative, and chest pain was found to be atypical with no real angina symptoms. She was not sent for cardiac catheterization as chronic kidney disease and diabetes would lead to high risk of contrast-induced neuropathy. If she continued have worsening symptoms however this could be reconsidered.  The patient was discharged on 08/08/2016. Lasix was held for 3 days due to elevated creatinine and was to be restarted at 20 mg daily. She is here for posthospitalization follow-up  [place] with her sister with multiple questions concerning her medication, her heart function, follow-up CHF treatment, and need to take Lasix. She feels well, has not been taking Lasix for the last 3 days, although has been asked to begin on 08/11/2016. She denies chest pain palpitations dyspnea on exertion. She is weighing herself daily with her dry weight at home 110 pounds.She has stopped smoking since discharge.  Past Medical History:  Diagnosis Date  . Allergy   . Cataract   . Diabetes mellitus without complication  (HCC)   . Hyperlipidemia   . Hypertension   . Thyroid disease     Past Surgical History:  Procedure Laterality Date  . ABDOMINAL HYSTERECTOMY     partial but later ovaries removed  . EYE SURGERY Bilateral    cataract  . OOPHORECTOMY Bilateral      Current Outpatient Prescriptions  Medication Sig Dispense Refill  . aspirin 81 MG tablet Take 81 mg by mouth daily.    Marland Kitchen. atorvastatin (LIPITOR) 80 MG tablet Take 1 tablet (80 mg total) by mouth daily at 6 PM. 30 tablet 0  . bisacodyl (DULCOLAX) 5 MG EC tablet Take 2 tablets (10 mg total) by mouth daily. 30 tablet 0  . calcium carbonate (OS-CAL) 600 MG TABS tablet Take 600 mg by mouth daily.    . carvedilol (COREG) 6.25 MG tablet Take 1 tablet (6.25 mg total) by mouth 2 (two) times daily with a meal. 60 tablet 0  . docusate sodium (COLACE) 100 MG capsule Take 2 capsules (200 mg total) by mouth 2 (two) times daily. 10 capsule 0  . furosemide (LASIX) 20 MG tablet Take 1 tablet (20 mg total) by mouth daily. 30 tablet 0  . hydrALAZINE (APRESOLINE) 50 MG tablet Take 1 tablet (50 mg total) by mouth every 8 (eight) hours. 90 tablet 0  . isosorbide mononitrate (IMDUR) 30 MG 24 hr tablet Take 1 tablet (30 mg total) by mouth daily. 30 tablet 0  . levothyroxine (SYNTHROID, LEVOTHROID) 88 MCG tablet Take 1 tablet (88 mcg total) by  mouth daily before breakfast. 90 tablet 2  . meclizine (ANTIVERT) 25 MG tablet Take 1 tablet (25 mg total) by mouth 4 (four) times daily as needed for dizziness. 50 tablet 1  . nicotine (NICODERM CQ - DOSED IN MG/24 HOURS) 21 mg/24hr patch Place 1 patch (21 mg total) onto the skin daily. 28 patch 0  . Omega-3 Fatty Acids (FISH OIL) 1000 MG CAPS Take 1 capsule by mouth daily.    Letta Pate DELICA LANCETS 33G MISC Use to check BG once a day.  Dx:E11.9 type 2 DM. 100 each 3  . ONETOUCH VERIO test strip TEST ONCE A DAY 100 each 2  . polyethylene glycol (MIRALAX / GLYCOLAX) packet Take 17 g by mouth 2 (two) times daily. 14 each 0     No current facility-administered medications for this visit.     Allergies:   Patient has no known allergies.    Social History:  The patient  reports that she has quit smoking. Her smoking use included Cigarettes. She started smoking 11 days ago. She has a 27.50 pack-year smoking history. She has never used smokeless tobacco. She reports that she does not drink alcohol or use drugs.   Family History:  The patient's family history includes COPD in her father; Down syndrome in her brother; Emphysema in her father; Heart attack in her mother; Heart disease in her mother.    ROS: All other systems are reviewed and negative. Unless otherwise mentioned in H&P    PHYSICAL EXAM: VS:  BP 112/60   Pulse 78   Ht 5\' 2"  (1.575 m)   Wt 113 lb (51.3 kg)   SpO2 98%   BMI 20.67 kg/m  , BMI Body mass index is 20.67 kg/m. GEN: Well nourished, well developed, in no acute distress thin, frail. HEENT: normal  Neck: no JVD, carotid bruits, or masses Cardiac: RRR; no murmurs, rubs, or gallops, mild puffiness in her ankles bilaterally in the dependent position  Respiratory: Some crackles in the bases, without wheezes, normal work of breathing GI: soft, nontender, nondistended, + BS MS: no deformity or atrophy  Skin: warm and dry, no rash Neuro:  Strength and sensation are intact Psych: euthymic mood, full affect  Recent Labs: 04/23/2016: ALT 8 08/04/2016: B Natriuretic Peptide 2,738.8; Hemoglobin 9.9; Platelets 270; TSH 1.986 August 21, 2016: Magnesium 1.7 08/08/2016: BUN 66; Creatinine, Ser 2.81; Potassium 4.3; Sodium 133    Lipid Panel    Component Value Date/Time   CHOL 129 04/23/2016 0912   TRIG 108 04/23/2016 0912   TRIG 177 (H) 01/22/2014 1207   HDL 45 04/23/2016 0912   HDL 39 (L) 01/22/2014 1207   CHOLHDL 2.9 04/23/2016 0912   LDLCALC 62 04/23/2016 0912   LDLCALC 81 05/12/2013 1610      Wt Readings from Last 3 Encounters:  08/14/16 113 lb (51.3 kg)  08/08/16 111 lb 14.4 oz (50.8 kg)   07/10/16 123 lb 12.8 oz (56.2 kg)      Other studies Reviewed: Echocardiogram Aug 21, 2016 Left ventricle: The cavity size was normal. Wall thickness was   increased in a pattern of moderate to severe LVH. There was focal   basal hypertrophy. Systolic function was moderately reduced. The   estimated ejection fraction was in the range of 35% to 40%.   Doppler parameters are consistent with abnormal left ventricular   relaxation (grade 1 diastolic dysfunction). - Aortic valve: There was very mild stenosis. Valve area (VTI):   1.29 cm^2. Valve area (Vmax): 1.3 cm^2. Valve  area (Vmean): 1.35   cm^2. - Mitral valve: There was mild regurgitation. - Left atrium: The atrium was moderately dilated. - Tricuspid valve: There was mild-moderate regurgitation. - Pulmonary arteries: Systolic pressure was moderately increased.   PA peak pressure: 44 mm Hg (S).  NM stress Test 08/06/2016 Study Result     No T wave inversion was noted during stress.  Defect 1: There is a large defect of moderate severity present in the basal inferoseptal, mid anteroseptal, mid inferoseptal, mid inferior, apical septal and apical inferior location.  Nuclear stress EF: 39%.  Findings consistent with ischemia.  This is an intermediate risk study.  Mild RV uptake noted, suggesting elevated pulmonary pressure   Large size, moderate intensity partially reversible anteroseptal, septal and inferoseptal perfusion defect. Findings suggestive of ischemia or less likely partially reversible LBBB-related defect. LVEF 39% with global hypokinesis and incoordinate septal motion. Mild RV tracer uptake noted. This is an intermediate risk study.     ASSESSMENT AND PLAN:  1. Chronic mixed CHF: She status post hospitalization for decompensation with echocardiogram revealing reduced ejection fraction of 35% to 45%. I have spent greater than 30 minutes with this patient and her sister explaining her medications, needs for daily  weight, salt restriction, discussion of heart failure and her current heart function. I've also discussed her kidney function, and need to avoid catheterization during hospitalization. I've answered multiple questions. She had her sister appeared to understand current diagnosis and prognosis.  She will continue daily weights with a self-reported dry weight of 110 pounds at home. I've asked her to restart her Lasix as directed at 20 mg daily. She is devoid salted foods. I've discussed low-sodium diet. I will repeat her BMET in a week since she will be back on the Lasix 20 mg daily. She may need to have repeat echocardiogram in 3 months to evaluate her response to medication. Would not start her on Entresto at this time due to chronic kidney disease. Once this is improved May need to consider switching. For now no medication changes.  2. Abnormal Lexiscan stress test: This did reveal areas of ischemia. She was not a candidate for catheterization due to kidney function, with high risk for contrast mediated nephropathy. She was taken off of metformin as well. She is going to be restarted on her Lasix as discussed above. She will continue medications as directed to include hydralazine, isosorbide, carvedilol 6.25 mg twice a day.  3. Tobacco abuse: She continues to wear a nicotine patch and has not smoked since discharge. Congratulated her on this lifestyle change.  4. Diabetes: The patient has not been taking metformin as this was discontinued on discharge. She is due to follow-up with her primary care provider Bennie Pierini, next week in [place].    Current medicines are reviewed at length with the patient today.  The patient wishes to follow up in [place] as this is where she lives. She is where the Dr. Antoine Poche cc patient's in [place] and is requested appointment with him as this is closer to home. We will make every effort to have this scheduled for her. I've advised her that if she is unable to  get in in [place] or requires more immediate clinic appointment she is welcome to call our office for follow-up.  Labs/ tests ordered today include: BMET  Bettey Mare. Liborio Nixon, ANP, AACC   08/14/2016 4:05 PM    [facility] [address], [place], [place] [address] Phone: [phone]; Fax: [phone]

## 2016-08-17 ENCOUNTER — Ambulatory Visit (INDEPENDENT_AMBULATORY_CARE_PROVIDER_SITE_OTHER): Payer: Medicare Other | Admitting: Nurse Practitioner

## 2016-08-17 ENCOUNTER — Encounter: Payer: Self-pay | Admitting: Nurse Practitioner

## 2016-08-17 VITALS — BP 130/76 | HR 74 | Temp 97.2°F | Ht 62.0 in | Wt 113.0 lb

## 2016-08-17 DIAGNOSIS — Z09 Encounter for follow-up examination after completed treatment for conditions other than malignant neoplasm: Secondary | ICD-10-CM

## 2016-08-17 DIAGNOSIS — I5033 Acute on chronic diastolic (congestive) heart failure: Secondary | ICD-10-CM | POA: Diagnosis not present

## 2016-08-17 NOTE — Progress Notes (Signed)
   Subjective:    Patient ID: Michele Walters, female    DOB: 07/15/1935, 81 y.o.   MRN: [id number]  HPI Patient [facility] for [facility]. Was discharged home on 08/08/16. They made no changes to her medications. SHeis doing much better. Denies SOB, dizziness and swelling. SHe is a litle fatigued but otherwise is doing well.    Review of Systems  Constitutional: Negative.   Eyes: Negative.   Respiratory: Negative.  Negative for shortness of breath.   Cardiovascular: Negative.  Negative for chest pain, palpitations and leg swelling.  Gastrointestinal: Negative.   Genitourinary: Negative.   Neurological: Negative.   Psychiatric/Behavioral: Negative.   All other systems reviewed and are negative.      Objective:   Physical Exam  Constitutional: She is oriented to person, place, and time. She appears well-developed and well-nourished. No distress.  Cardiovascular: Normal rate and regular rhythm.   Pulmonary/Chest: Effort normal and breath sounds normal.  Abdominal: Soft. Bowel sounds are normal.  Neurological: She is alert and oriented to person, place, and time.  Skin: Skin is warm and dry.  Psychiatric: She has a normal mood and affect. Her behavior is normal. Judgment and thought content normal.   BP 130/76   Pulse 74   Temp (!) 97.2 F (36.2 C) (Oral)   Ht 5\' 2"  (1.575 m)   Wt 113 lb (51.3 kg)   BMI 20.67 kg/m        Assessment & Plan:   1. Hospital discharge follow-up   2. Acute on chronic diastolic CHF (congestive heart failure) [facility](HCC)    Hospital records reviewed Continue all meds Keep follow up appointment with dr. O[place]-locka Lionshochrien in august Daily weights- if greater then 2lb weight gain in 24hours need to call and let me know  Mary-Margaret Daphine DeutscherMartin, FNP

## 2016-08-17 NOTE — Patient Instructions (Signed)
Heart Failure °Heart failure means your heart has trouble pumping blood. This makes it hard for your body to work well. Heart failure is usually a long-term (chronic) condition. You must take good care of yourself and follow your doctor's treatment plan. °Follow these instructions at home: °· Take your heart medicine as told by your doctor. °? Do not stop taking medicine unless your doctor tells you to. °? Do not skip any dose of medicine. °? Refill your medicines before they run out. °? Take other medicines only as told by your doctor or pharmacist. °· Stay active if told by your doctor. The elderly and people with severe heart failure should talk with a doctor about physical activity. °· Eat heart-healthy foods. Choose foods that are without trans fat and are low in saturated fat, cholesterol, and salt (sodium). This includes fresh or frozen fruits and vegetables, fish, lean meats, fat-free or low-fat dairy foods, whole grains, and high-fiber foods. Lentils and dried peas and beans (legumes) are also good choices. °· Limit salt if told by your doctor. °· Cook in a healthy way. Roast, grill, broil, bake, poach, steam, or stir-fry foods. °· Limit fluids as told by your doctor. °· Weigh yourself every morning. Do this after you pee (urinate) and before you eat breakfast. Write down your weight to give to your doctor. °· Take your blood pressure and write it down if your doctor tells you to. °· Ask your doctor how to check your pulse. Check your pulse as told. °· Lose weight if told by your doctor. °· Stop smoking or chewing tobacco. Do not use gum or patches that help you quit without your doctor's approval. °· Schedule and go to doctor visits as told. °· Nonpregnant women should have no more than 1 drink a day. Men should have no more than 2 drinks a day. Talk to your doctor about drinking alcohol. °· Stop illegal drug use. °· Stay current with shots (immunizations). °· Manage your health conditions as told by your  doctor. °· Learn to manage your stress. °· Rest when you are tired. °· If it is really hot outside: °? Avoid intense activities. °? Use air conditioning or fans, or get in a cooler place. °? Avoid caffeine and alcohol. °? Wear loose-fitting, lightweight, and light-colored clothing. °· If it is really cold outside: °? Avoid intense activities. °? Layer your clothing. °? Wear mittens or gloves, a hat, and a scarf when going outside. °? Avoid alcohol. °· Learn about heart failure and get support as needed. °· Get help to maintain or improve your quality of life and your ability to care for yourself as needed. °Contact a doctor if: °· You gain weight quickly. °· You are more short of breath than usual. °· You cannot do your normal activities. °· You tire easily. °· You cough more than normal, especially with activity. °· You have any or more puffiness (swelling) in areas such as your hands, feet, ankles, or belly (abdomen). °· You cannot sleep because it is hard to breathe. °· You feel like your heart is beating fast (palpitations). °· You get dizzy or light-headed when you stand up. °Get help right away if: °· You have trouble breathing. °· There is a change in mental status, such as becoming less alert or not being able to focus. °· You have chest pain or discomfort. °· You faint. °This information is not intended to replace advice given to you by your health care provider. Make sure you   discuss any questions you have with your health care provider. °Document Released: 11/01/2007 Document Revised: 06/30/2015 Document Reviewed: 03/10/2012 °Elsevier Interactive Patient Education © 2017 [employer] ° °

## 2016-08-24 ENCOUNTER — Telehealth: Payer: Self-pay | Admitting: Nurse Practitioner

## 2016-08-24 NOTE — Telephone Encounter (Signed)
Please advise 

## 2016-08-24 NOTE — Telephone Encounter (Signed)
We would need to repeat her potassium level first- there is order in computer to have done. Will she drink ensure or glucerna?

## 2016-08-27 NOTE — Telephone Encounter (Signed)
lmtcb jkp 7/23 

## 2016-08-31 ENCOUNTER — Telehealth: Payer: Self-pay | Admitting: Cardiology

## 2016-08-31 NOTE — Telephone Encounter (Signed)
Patient sister calling, states that patient is weak, has dizziness and "not moving around well at all." Patient also has a "bad appetite."   Ms. Reba was requesting that patient come in sooner but I did not see the availability.

## 2016-08-31 NOTE — Telephone Encounter (Signed)
Reba Gibson-sister Lm2cb

## 2016-08-31 NOTE — Telephone Encounter (Signed)
Michele Walters-sister Lm2cb 

## 2016-08-31 NOTE — Telephone Encounter (Signed)
Follow up   Pt sister is returning call.

## 2016-09-04 ENCOUNTER — Emergency Department (HOSPITAL_COMMUNITY): Payer: Medicare Other

## 2016-09-04 ENCOUNTER — Inpatient Hospital Stay (HOSPITAL_COMMUNITY)
Admission: EM | Admit: 2016-09-04 | Discharge: 2016-09-08 | DRG: 378 | Disposition: A | Discharge status: Skilled Nursing Facility | Payer: Medicare Other | Attending: Internal Medicine | Admitting: Internal Medicine

## 2016-09-04 ENCOUNTER — Encounter (HOSPITAL_COMMUNITY): Payer: Self-pay | Admitting: Emergency Medicine

## 2016-09-04 ENCOUNTER — Telehealth: Payer: Self-pay | Admitting: Nurse Practitioner

## 2016-09-04 DIAGNOSIS — D638 Anemia in other chronic diseases classified elsewhere: Secondary | ICD-10-CM | POA: Diagnosis present

## 2016-09-04 DIAGNOSIS — E1121 Type 2 diabetes mellitus with diabetic nephropathy: Secondary | ICD-10-CM | POA: Diagnosis not present

## 2016-09-04 DIAGNOSIS — I110 Hypertensive heart disease with heart failure: Secondary | ICD-10-CM | POA: Diagnosis present

## 2016-09-04 DIAGNOSIS — Z90710 Acquired absence of both cervix and uterus: Secondary | ICD-10-CM

## 2016-09-04 DIAGNOSIS — N185 Chronic kidney disease, stage 5: Secondary | ICD-10-CM | POA: Diagnosis present

## 2016-09-04 DIAGNOSIS — R29898 Other symptoms and signs involving the musculoskeletal system: Secondary | ICD-10-CM | POA: Diagnosis present

## 2016-09-04 DIAGNOSIS — E039 Hypothyroidism, unspecified: Secondary | ICD-10-CM | POA: Diagnosis not present

## 2016-09-04 DIAGNOSIS — Z7982 Long term (current) use of aspirin: Secondary | ICD-10-CM

## 2016-09-04 DIAGNOSIS — S299XXA Unspecified injury of thorax, initial encounter: Secondary | ICD-10-CM | POA: Diagnosis not present

## 2016-09-04 DIAGNOSIS — E785 Hyperlipidemia, unspecified: Secondary | ICD-10-CM | POA: Diagnosis present

## 2016-09-04 DIAGNOSIS — K922 Gastrointestinal hemorrhage, unspecified: Principal | ICD-10-CM | POA: Diagnosis present

## 2016-09-04 DIAGNOSIS — Z79899 Other long term (current) drug therapy: Secondary | ICD-10-CM

## 2016-09-04 DIAGNOSIS — N183 Chronic kidney disease, stage 3 unspecified: Secondary | ICD-10-CM | POA: Diagnosis present

## 2016-09-04 DIAGNOSIS — W19XXXA Unspecified fall, initial encounter: Secondary | ICD-10-CM | POA: Diagnosis present

## 2016-09-04 DIAGNOSIS — D649 Anemia, unspecified: Secondary | ICD-10-CM | POA: Diagnosis present

## 2016-09-04 DIAGNOSIS — R42 Dizziness and giddiness: Secondary | ICD-10-CM | POA: Diagnosis not present

## 2016-09-04 DIAGNOSIS — I5042 Chronic combined systolic (congestive) and diastolic (congestive) heart failure: Secondary | ICD-10-CM | POA: Diagnosis not present

## 2016-09-04 DIAGNOSIS — E1122 Type 2 diabetes mellitus with diabetic chronic kidney disease: Secondary | ICD-10-CM | POA: Diagnosis present

## 2016-09-04 DIAGNOSIS — M6281 Muscle weakness (generalized): Secondary | ICD-10-CM | POA: Diagnosis not present

## 2016-09-04 DIAGNOSIS — R279 Unspecified lack of coordination: Secondary | ICD-10-CM | POA: Diagnosis not present

## 2016-09-04 DIAGNOSIS — Z8249 Family history of ischemic heart disease and other diseases of the circulatory system: Secondary | ICD-10-CM

## 2016-09-04 DIAGNOSIS — R748 Abnormal levels of other serum enzymes: Secondary | ICD-10-CM | POA: Diagnosis present

## 2016-09-04 DIAGNOSIS — I132 Hypertensive heart and chronic kidney disease with heart failure and with stage 5 chronic kidney disease, or end stage renal disease: Secondary | ICD-10-CM | POA: Diagnosis present

## 2016-09-04 DIAGNOSIS — R262 Difficulty in walking, not elsewhere classified: Secondary | ICD-10-CM | POA: Diagnosis not present

## 2016-09-04 DIAGNOSIS — R1312 Dysphagia, oropharyngeal phase: Secondary | ICD-10-CM | POA: Diagnosis not present

## 2016-09-04 DIAGNOSIS — R531 Weakness: Secondary | ICD-10-CM | POA: Diagnosis not present

## 2016-09-04 DIAGNOSIS — Z87891 Personal history of nicotine dependence: Secondary | ICD-10-CM

## 2016-09-04 DIAGNOSIS — N179 Acute kidney failure, unspecified: Secondary | ICD-10-CM | POA: Diagnosis not present

## 2016-09-04 DIAGNOSIS — R778 Other specified abnormalities of plasma proteins: Secondary | ICD-10-CM | POA: Diagnosis present

## 2016-09-04 DIAGNOSIS — Z9181 History of falling: Secondary | ICD-10-CM | POA: Diagnosis not present

## 2016-09-04 DIAGNOSIS — R7989 Other specified abnormal findings of blood chemistry: Secondary | ICD-10-CM

## 2016-09-04 DIAGNOSIS — E119 Type 2 diabetes mellitus without complications: Secondary | ICD-10-CM

## 2016-09-04 DIAGNOSIS — I10 Essential (primary) hypertension: Secondary | ICD-10-CM | POA: Diagnosis not present

## 2016-09-04 LAB — CBC WITH DIFFERENTIAL/PLATELET
BASOS ABS: 0 10*3/uL (ref 0.0–0.1)
Basophils Relative: 0 %
Eosinophils Absolute: 0.1 10*3/uL (ref 0.0–0.7)
Eosinophils Relative: 1 %
HEMATOCRIT: 19 % — AB (ref 36.0–46.0)
Hemoglobin: 6.5 g/dL — CL (ref 12.0–15.0)
LYMPHS ABS: 1.3 10*3/uL (ref 0.7–4.0)
LYMPHS PCT: 10 %
MCH: 31.7 pg (ref 26.0–34.0)
MCHC: 34.2 g/dL (ref 30.0–36.0)
MCV: 92.7 fL (ref 78.0–100.0)
MONO ABS: 0.9 10*3/uL (ref 0.1–1.0)
Monocytes Relative: 7 %
NEUTROS ABS: 10.9 10*3/uL — AB (ref 1.7–7.7)
Neutrophils Relative %: 82 %
Platelets: 165 10*3/uL (ref 150–400)
RBC: 2.05 MIL/uL — AB (ref 3.87–5.11)
RDW: 25.1 % — ABNORMAL HIGH (ref 11.5–15.5)
WBC: 13.3 10*3/uL — ABNORMAL HIGH (ref 4.0–10.5)

## 2016-09-04 LAB — URINALYSIS, ROUTINE W REFLEX MICROSCOPIC
BACTERIA UA: NONE SEEN
Bilirubin Urine: NEGATIVE
Glucose, UA: 50 mg/dL — AB
HGB URINE DIPSTICK: NEGATIVE
Ketones, ur: NEGATIVE mg/dL
NITRITE: NEGATIVE
PH: 5 (ref 5.0–8.0)
Protein, ur: NEGATIVE mg/dL
RBC / HPF: NONE SEEN RBC/hpf (ref 0–5)
SPECIFIC GRAVITY, URINE: 1.012 (ref 1.005–1.030)

## 2016-09-04 LAB — FOLATE: FOLATE: 12.6 ng/mL (ref >5.9)

## 2016-09-04 LAB — ABO/RH: ABO/RH(D): AB POS

## 2016-09-04 LAB — VITAMIN B12: VITAMIN B 12: 735 pg/mL (ref 180–914)

## 2016-09-04 LAB — BASIC METABOLIC PANEL
ANION GAP: 13 (ref 5–15)
BUN: 88 mg/dL — ABNORMAL HIGH (ref 6–20)
CALCIUM: 8.8 mg/dL — AB (ref 8.9–10.3)
CO2: 22 mmol/L (ref 22–32)
Chloride: 96 mmol/L — ABNORMAL LOW (ref 101–111)
Creatinine, Ser: 3.24 mg/dL — ABNORMAL HIGH (ref 0.44–1.00)
GFR, EST AFRICAN AMERICAN: 15 mL/min — AB (ref >60)
GFR, EST NON AFRICAN AMERICAN: 13 mL/min — AB (ref >60)
Glucose, Bld: 369 mg/dL — ABNORMAL HIGH (ref 65–99)
Potassium: 4.3 mmol/L (ref 3.5–5.1)
SODIUM: 131 mmol/L — AB (ref 135–145)

## 2016-09-04 LAB — CBG MONITORING, ED: GLUCOSE-CAPILLARY: 350 mg/dL — AB (ref 65–99)

## 2016-09-04 LAB — HEPATIC FUNCTION PANEL
ALK PHOS: 106 U/L (ref 38–126)
ALT: 42 U/L (ref 14–54)
AST: 57 U/L — ABNORMAL HIGH (ref 15–41)
Albumin: 2.5 g/dL — ABNORMAL LOW (ref 3.5–5.0)
BILIRUBIN DIRECT: 0.9 mg/dL — AB (ref 0.1–0.5)
BILIRUBIN INDIRECT: 1.8 mg/dL — AB (ref 0.3–0.9)
Total Bilirubin: 2.7 mg/dL — ABNORMAL HIGH (ref 0.3–1.2)
Total Protein: 6 g/dL — ABNORMAL LOW (ref 6.5–8.1)

## 2016-09-04 LAB — POC OCCULT BLOOD, ED: FECAL OCCULT BLD: NEGATIVE

## 2016-09-04 LAB — RETICULOCYTES
RBC.: 1.96 MIL/uL — AB (ref 3.87–5.11)
RETIC CT PCT: 20 % — AB (ref 0.4–3.1)
Retic Count, Absolute: 392 10*3/uL — ABNORMAL HIGH (ref 19.0–186.0)

## 2016-09-04 LAB — IRON AND TIBC
Iron: 88 ug/dL (ref 28–170)
Saturation Ratios: 32 % — ABNORMAL HIGH (ref 10.4–31.8)
TIBC: 277 ug/dL (ref 250–450)
UIBC: 189 ug/dL

## 2016-09-04 LAB — GLUCOSE, CAPILLARY: Glucose-Capillary: 290 mg/dL — ABNORMAL HIGH (ref 65–99)

## 2016-09-04 LAB — TROPONIN I
Troponin I: 0.07 ng/mL (ref <0.03)
Troponin I: 0.06 ng/mL (ref <0.03)

## 2016-09-04 LAB — PREPARE RBC (CROSSMATCH)

## 2016-09-04 LAB — FERRITIN: Ferritin: 310 ng/mL — ABNORMAL HIGH (ref 11–307)

## 2016-09-04 MED ORDER — NICOTINE 21 MG/24HR TD PT24
21.0000 mg | MEDICATED_PATCH | Freq: Every day | TRANSDERMAL | Status: DC
  Administered 2016-09-04 – 2016-09-08 (×5): 21 mg via TRANSDERMAL
  Filled 2016-09-04 (×5): qty 1

## 2016-09-04 MED ORDER — MECLIZINE HCL 12.5 MG PO TABS: 25.0000 mg | ORAL_TABLET | Freq: Four times a day (QID) | ORAL | Status: DC | PRN

## 2016-09-04 MED ORDER — ISOSORBIDE MONONITRATE ER 30 MG PO TB24
30.0000 mg | ORAL_TABLET | Freq: Every day | ORAL | Status: DC
  Administered 2016-09-05 – 2016-09-08 (×4): 30 mg via ORAL
  Filled 2016-09-04 (×4): qty 1

## 2016-09-04 MED ORDER — LEVOTHYROXINE SODIUM 88 MCG PO TABS
88.0000 ug | ORAL_TABLET | Freq: Every day | ORAL | Status: DC
  Administered 2016-09-05 – 2016-09-08 (×4): 88 ug via ORAL
  Filled 2016-09-04 (×4): qty 1

## 2016-09-04 MED ORDER — ONDANSETRON HCL 4 MG PO TABS: 4.0000 mg | ORAL_TABLET | Freq: Four times a day (QID) | ORAL | Status: DC | PRN

## 2016-09-04 MED ORDER — ACETAMINOPHEN 325 MG PO TABS: 650.0000 mg | ORAL_TABLET | Freq: Four times a day (QID) | ORAL | Status: DC | PRN

## 2016-09-04 MED ORDER — DOCUSATE SODIUM 100 MG PO CAPS
100.0000 mg | ORAL_CAPSULE | Freq: Two times a day (BID) | ORAL | Status: DC
  Administered 2016-09-04 – 2016-09-08 (×8): 100 mg via ORAL
  Filled 2016-09-04 (×8): qty 1

## 2016-09-04 MED ORDER — CARVEDILOL 12.5 MG PO TABS
6.2500 mg | ORAL_TABLET | Freq: Two times a day (BID) | ORAL | Status: DC
  Administered 2016-09-05 – 2016-09-08 (×7): 6.25 mg via ORAL
  Filled 2016-09-04 (×7): qty 1

## 2016-09-04 MED ORDER — INSULIN ASPART 100 UNIT/ML ~~LOC~~ SOLN
0.0000 [IU] | Freq: Three times a day (TID) | SUBCUTANEOUS | Status: DC
  Administered 2016-09-05 (×2): 2 [IU] via SUBCUTANEOUS
  Administered 2016-09-05: 5 [IU] via SUBCUTANEOUS
  Administered 2016-09-06 (×3): 2 [IU] via SUBCUTANEOUS
  Administered 2016-09-07: 5 [IU] via SUBCUTANEOUS
  Administered 2016-09-07: 2 [IU] via SUBCUTANEOUS
  Administered 2016-09-07 – 2016-09-08 (×2): 1 [IU] via SUBCUTANEOUS
  Administered 2016-09-08: 3 [IU] via SUBCUTANEOUS

## 2016-09-04 MED ORDER — ACETAMINOPHEN 650 MG RE SUPP: 650.0000 mg | Freq: Four times a day (QID) | RECTAL | Status: DC | PRN

## 2016-09-04 MED ORDER — POLYETHYLENE GLYCOL 3350 17 G PO PACK: 17.0000 g | PACK | Freq: Every day | ORAL | Status: DC | PRN

## 2016-09-04 MED ORDER — HYDRALAZINE HCL 25 MG PO TABS
50.0000 mg | ORAL_TABLET | Freq: Three times a day (TID) | ORAL | Status: DC
  Administered 2016-09-05 – 2016-09-08 (×8): 50 mg via ORAL
  Filled 2016-09-04 (×9): qty 2

## 2016-09-04 MED ORDER — ONDANSETRON HCL 4 MG/2ML IJ SOLN: 4.0000 mg | Freq: Four times a day (QID) | INTRAMUSCULAR | Status: DC | PRN

## 2016-09-04 MED ORDER — ASPIRIN EC 81 MG PO TBEC
81.0000 mg | DELAYED_RELEASE_TABLET | Freq: Every day | ORAL | Status: DC
  Administered 2016-09-04 – 2016-09-05 (×2): 81 mg via ORAL
  Filled 2016-09-04 (×2): qty 1

## 2016-09-04 MED ORDER — SODIUM CHLORIDE 0.9 % IV SOLN: 10.0000 mL/h | Freq: Once | INTRAVENOUS | Status: DC

## 2016-09-04 MED ORDER — ATORVASTATIN CALCIUM 40 MG PO TABS
80.0000 mg | ORAL_TABLET | Freq: Every day | ORAL | Status: DC
  Administered 2016-09-05 – 2016-09-07 (×3): 80 mg via ORAL
  Filled 2016-09-04 (×2): qty 2
  Filled 2016-09-04: qty 1
  Filled 2016-09-04: qty 2
  Filled 2016-09-04: qty 1

## 2016-09-04 MED ORDER — INSULIN ASPART 100 UNIT/ML ~~LOC~~ SOLN
0.0000 [IU] | Freq: Every day | SUBCUTANEOUS | Status: DC
  Administered 2016-09-04: 3 [IU] via SUBCUTANEOUS

## 2016-09-04 MED ORDER — BISACODYL 5 MG PO TBEC: 10.0000 mg | DELAYED_RELEASE_TABLET | Freq: Every day | ORAL | Status: DC | PRN

## 2016-09-04 NOTE — ED Notes (Signed)
Second call for report

## 2016-09-04 NOTE — Telephone Encounter (Signed)
Please advise 

## 2016-09-04 NOTE — ED Provider Notes (Signed)
[facility] DEPT Provider Note   CSN: [id number] Arrival date & time: 09/04/16  1401     History   Chief Complaint Chief Complaint  Patient presents with  . Weakness    HPI Michele Walters is a 81 y.o. female.  HPI  81 y.o.female,history of hypertension, DM type II, dyslipidemia, hypothyroidism, smoking which is ongoing, CKD and recently diagnosed CHF and reversible disease found on myoview that is being medically managed comes in to the ER with cc of weakness and fall. Pt's sister also provides history. Per patient since her discharge, she has progressively gotten weaker and has had 2-3 falls. She had a fall yday where she basically slipped and was unable to get up and move around. Family finally helped her up and they had to put her on a wheel chair. Pt has not been able to ambulate at all since then.  Pt denies nausea, emesis, fevers, chills, chest pains, shortness of breath,cough, headaches, abdominal pain, uti like symptoms. She has been eating less than usual.  Pt lives by herself and prior to her admission was very independent. Sister is concerned that patient has now gone from  Walking independently to wheel chair bound status.    Past Medical History:  Diagnosis Date  . Allergy   . Cataract   . Diabetes mellitus without complication ([place])   . Hyperlipidemia   . Hypertension   . Thyroid disease     Patient Active Problem List   Diagnosis Date Noted  . Acute congestive heart failure ([place])   . Chest pain   . LBBB (left bundle branch block)   . Acute on chronic diastolic CHF (congestive heart failure) ([place]) 08/04/2016  . Acute hypercapnic respiratory failure ([place]) 08/04/2016  . Acute respiratory failure with hypoxia ([place])   . Smoker 11/23/2014  . Kidney disease, chronic, stage III (moderate, EGFR 30-59 ml/min) 10/20/2014  . DM2 (diabetes mellitus, type 2) ([place]) 10/15/2012  . HTN (hypertension) 05/06/2010  . Hypothyroidism 05/06/2010  . Hyperlipidemia  05/06/2010    Past Surgical History:  Procedure Laterality Date  . ABDOMINAL HYSTERECTOMY     partial but later ovaries removed  . EYE SURGERY Bilateral    cataract  . OOPHORECTOMY Bilateral     OB History    No data available       Home Medications    Prior to Admission medications   Medication Sig Start Date End Date Taking? Authorizing Provider  aspirin 81 MG tablet Take 81 mg by mouth daily.   Yes [provider]  atorvastatin (LIPITOR) 80 MG tablet Take 1 tablet (80 mg total) by mouth daily at 6 PM. 08/08/16  Yes Dessa Phi Chahn-Yang, DO  bisacodyl (DULCOLAX) 5 MG EC tablet Take 2 tablets (10 mg total) by mouth daily. Patient taking differently: Take 10 mg by mouth daily as needed for mild constipation or moderate constipation.  08/09/16  Yes Dessa Phi Chahn-Yang, DO  calcium carbonate (OS-CAL) 600 MG TABS tablet Take 600 mg by mouth every other day.    Yes [provider]  carvedilol (COREG) 6.25 MG tablet Take 1 tablet (6.25 mg total) by mouth 2 (two) times daily with a meal. 08/08/16  Yes Dessa Phi Chahn-Yang, DO  docusate sodium (COLACE) 100 MG capsule Take 2 capsules (200 mg total) by mouth 2 (two) times daily. Patient taking differently: Take 200 mg by mouth daily as needed.  08/08/16  Yes Dessa Phi Chahn-Yang, DO  furosemide (LASIX) 20 MG tablet Take 1  tablet (20 mg total) by mouth daily. 08/11/16 09/10/16 Yes Dessa Phi Chahn-Yang, DO  hydrALAZINE (APRESOLINE) 50 MG tablet Take 1 tablet (50 mg total) by mouth every 8 (eight) hours. 08/08/16  Yes Dessa Phi Chahn-Yang, DO  isosorbide mononitrate (IMDUR) 30 MG 24 hr tablet Take 1 tablet (30 mg total) by mouth daily. 08/09/16  Yes Dessa Phi Chahn-Yang, DO  levothyroxine (SYNTHROID, LEVOTHROID) 88 MCG tablet Take 1 tablet (88 mcg total) by mouth daily before breakfast. 04/23/16  Yes Hassell Done, Mary-Margaret, FNP  meclizine (ANTIVERT) 25 MG tablet Take 1 tablet (25 mg total) by mouth 4 (four)  times daily as needed for dizziness. 05/07/16  Yes Hawks, Christy A, FNP  nicotine (NICODERM CQ - DOSED IN MG/24 HOURS) 21 mg/24hr patch Place 1 patch (21 mg total) onto the skin daily. 08/09/16  Yes Dessa Phi Chahn-Yang, DO  Omega-3 Fatty Acids (FISH OIL) 1000 MG CAPS Take 1 capsule by mouth every other day.    Yes [provider]  polyethylene glycol (MIRALAX / GLYCOLAX) packet Take 17 g by mouth 2 (two) times daily. Patient taking differently: Take 17 g by mouth daily as needed for mild constipation or moderate constipation.  08/08/16  Yes Choi, Jennifer Chahn-Yang, DO  ONETOUCH DELICA LANCETS 16X MISC Use to check BG once a day.  Dx:E11.9 type 2 DM. 10/26/15   Cherre Robins, PharmD  [facility] VERIO test strip TEST ONCE A DAY 05/02/16   Chevis Pretty, FNP    Family History Family History  Problem Relation Age of Onset  . Heart disease Mother   . Heart attack Mother        massive  . COPD Father   . Emphysema Father   . Down syndrome Brother     Social History Social History  Substance Use Topics  . Smoking status: Former Smoker    Packs/day: 0.50    Years: 55.00    Types: Cigarettes    Start date: 08/03/2016  . Smokeless tobacco: Never Used  . Alcohol use No     Allergies   Patient has no known allergies.   Review of Systems Review of Systems  Constitutional: Positive for activity change, appetite change and fatigue. Negative for fever.  Respiratory: Negative for shortness of breath.   Cardiovascular: Negative for chest pain.  Gastrointestinal: Negative for abdominal pain.  Allergic/Immunologic: Negative for immunocompromised state.  Hematological: Does not bruise/bleed easily.  All other systems reviewed and are negative.    Physical Exam Updated Vital Signs BP (!) 118/95 (BP Location: Left Arm)   Pulse 71   Temp 97.6 F (36.4 C) (Oral)   Resp (!) 21   SpO2 93%   Physical Exam  Constitutional: She is oriented to person, place, and time. She  appears well-developed and well-nourished.  HENT:  Head: Normocephalic and atraumatic.  Eyes: Pupils are equal, round, and reactive to light. EOM are normal.  Neck: Neck supple.  Cardiovascular: Normal rate, regular rhythm and intact distal pulses.   Murmur heard. Pulmonary/Chest: Effort normal. No respiratory distress.  Abdominal: Soft. She exhibits no distension. There is no tenderness. There is no rebound and no guarding.  Neurological: She is alert and oriented to person, place, and time.  LLE has 3+/5 strength otherwise: Cerebellar exam is normal (finger to nose) Sensory exam normal for bilateral upper and lower extremities - and patient is able to discriminate between sharp and dull.  When we tried to walk patient, she was unsteady and was only able to take  2-3 steps, and she required help with those 2 steps    Skin: Skin is warm and dry.  Nursing note and vitals reviewed.    ED Treatments / Results  Labs (all labs ordered are listed, but only abnormal results are displayed) Labs Reviewed  BASIC METABOLIC PANEL - Abnormal; Notable for the following:       Result Value   Sodium 131 (*)    Chloride 96 (*)    Glucose, Bld 369 (*)    BUN 88 (*)    Creatinine, Ser 3.24 (*)    Calcium 8.8 (*)    GFR calc non Af Amer 13 (*)    GFR calc Af Amer 15 (*)    All other components within normal limits  CBC WITH DIFFERENTIAL/PLATELET - Abnormal; Notable for the following:    WBC 13.3 (*)    RBC 2.05 (*)    Hemoglobin 6.5 (*)    HCT 19.0 (*)    RDW 25.1 (*)    Neutro Abs 10.9 (*)    All other components within normal limits  CBG MONITORING, ED - Abnormal; Notable for the following:    Glucose-Capillary 350 (*)    All other components within normal limits  URINALYSIS, ROUTINE W REFLEX MICROSCOPIC  TROPONIN I  POC OCCULT BLOOD, ED  TYPE AND SCREEN  PREPARE RBC (CROSSMATCH)    EKG  EKG Interpretation  Date/Time:  Tuesday September 04 2016 14:18:44 EDT Ventricular Rate:   68 PR Interval:    QRS Duration: 106 QT Interval:  437 QTC Calculation: 465 R Axis:   97 Text Interpretation:  Sinus rhythm Borderline short PR interval Right axis deviation Nonspecific repol abnormality, diffuse leads Nonspecific ST and T wave abnormality Confirmed by Varney Biles [phone]) on 09/04/2016 2:33:49 PM       Radiology No results found.  Procedures Procedures (including critical care time)  CRITICAL CARE Performed by: Varney Biles   Total critical care time: 58 minutes for symptomatic anemia  Critical care time was exclusive of separately billable procedures and treating other patients.  Critical care was necessary to treat or prevent imminent or life-threatening deterioration.  Critical care was time spent personally by me on the following activities: development of treatment plan with patient and/or surrogate as well as nursing, discussions with consultants, evaluation of patient's response to treatment, examination of patient, obtaining history from patient or surrogate, ordering and performing treatments and interventions, ordering and review of laboratory studies, ordering and review of radiographic studies, pulse oximetry and re-evaluation of patient's condition.    Medications Ordered in ED Medications  0.9 %  sodium chloride infusion (not administered)     Initial Impression / Assessment and Plan / ED Course  I have reviewed the triage vital signs and the nursing notes.  Pertinent labs & imaging results that were available during my care of the patient were reviewed by me and considered in my medical decision making (see chart for details).  Clinical Course as of Sep 04 1545  Tue Sep 04, 2016  1544 Pt has symptomatic anemia - new. Will get a DRE.  Will transfuse 2 units PRBCs Hemoglobin: (!!) 6.5 [AN]  3557 Pt has acute on chronic renal failure. She also has CHF. The AKI is likely pre-renal. Pt is not fluid overloaded. I think the PRBC will  likely improve the renal perfusion.  Creatinine: (!) 3.24 [AN]  1545 We will order 2 units PRBCs.  Slow transfusion. We will advise medicine to give lasix  in between the [place].  [AN]    Clinical Course User Index [AN] Varney Biles, MD    DDx: Sepsis syndrome ACS syndrome DKA ICH / Stroke CHF exacerbation Infection - pneumonia/UTI/Cellulitis PE Dehydration Electrolyte abnormality  Pt comes in with weakness and multiple falls. Recent admission for chest pain - pt was found to have new onset CHF and her Myoview showed reversible ischemia, but due to her other co morbidities she is is being medically managed.  Pt noted to be profoundly unsteady and weak on my exam. She has no focal complains.  We have already called SW as I don't think it is safe to send her home. It seems like last visit there was no PT/OT.  Final Clinical Impressions(s) / ED Diagnoses   Final diagnoses:  Acute renal failure superimposed on stage 5 chronic kidney disease, not on chronic dialysis, unspecified acute renal failure type ([place])  Symptomatic anemia    New Prescriptions New Prescriptions   No medications on file     Varney Biles, MD 09/04/16 1547

## 2016-09-04 NOTE — ED Notes (Signed)
Pt inquires why she is not in a room, that this RN should take her to her room now

## 2016-09-04 NOTE — ED Triage Notes (Signed)
Pt in hospital July 4th for CHF. Sister states pt has had genearlized weakness since. Pt a/o. Mild weakness noted. Pt denies n/v/d/sob/pain. Pt states she has vertigo and gets dizzy sometimes/ pt has fallen x2 in past month and losing weight per sister.

## 2016-09-04 NOTE — ED Notes (Signed)
CRITICAL VALUE ALERT  Critical Value:  hgb 6.5  Date & Time Notied:  1535 09/04/16  Provider Notified: Rhunette CroftNanavati  Orders Received/Actions taken: none given

## 2016-09-04 NOTE — ED Notes (Signed)
CRITICAL VALUE ALERT  Critical Value:  Troponin = 0.07  Date & Time Notied:  09/04/2016  1531  Provider Notified: Rhunette CroftNanavati  Orders Received/Actions taken:

## 2016-09-04 NOTE — ED Notes (Signed)
1st unit of blood was released but lab notified writer that pt has positive antibodies and [facility].

## 2016-09-04 NOTE — Telephone Encounter (Signed)
No answer

## 2016-09-04 NOTE — H&P (Signed)
History and Physical    Michele Walters ZOX:[id number] DOB: 10-04-1935 DOA: 09/04/2016  PCP: Chevis Pretty, FNP Consultants:  Johnsie Cancel - cardiology Patient coming from: Home - lives alone but next door to her sister; NOK: sister, [phone]  Chief Complaint: weakness  HPI: Michele Walters is a 81 y.o. female with medical history significant of hypertension, DM type II, dyslipidemia, hypothyroidism, tobacco dependence (stopped 08/02/16), CKD for baseline creatinine close to 1.5, and recent (6/30-7/4) hospitalization at [facility] for newly diagnosed combined heart failure (EF 35-40%) and ARF on CKD.  She has been home since 7/4.  She was doing fairly well initially but has had minimal appetite since getting home.  As a result, she has been getting weaker and weaker every day.  She reports that food just doesn't taste good and so she does not want to eat.  They think she has lost about 6 pounds.  She fell yesterday PM and the rescue squad had to come get her up.  She has difficult bearing weight on her legs due to weakness.  She took Meclizine in the past for vertigo and her head felt a little swimmy yesterday but she was out of Meclizine.  Her ears have been ringing and it feels like the wind blowing.  Her family is concerned that she is no longer safe at home.  No n/v, no blood in stools.  No pain.  No chest pain or SOB.  Pees 2-3 times daily, unchanged. No LE edema.   ED Course: New symptomatic anemia, Hgb 6.5 - transfuse 2 units PRBC, Lasix between units.  Acute renal failure on CKD - ?prerenal.  Profoundly unsteady and weak on exam.  Review of Systems: As per HPI; otherwise  review of systems reviewed and negative.   Ambulatory Status:  Did not previously require assistaance with ambulation but her sister doesn't think she is strong enough to use either now  Past Medical History:  Diagnosis Date  . Allergy   . Cataract   . Diabetes mellitus without complication ([place])   . Hyperlipidemia   .  Hypertension   . Thyroid disease     Past Surgical History:  Procedure Laterality Date  . ABDOMINAL HYSTERECTOMY     partial but later ovaries removed  . EYE SURGERY Bilateral    cataract  . OOPHORECTOMY Bilateral     Social History   Social History  . Marital status: Single    Spouse name: N/A  . Number of children: N/A  . Years of education: N/A   Occupational History  . Retired    Social History Main Topics  . Smoking status: Former Smoker    Packs/day: 1.00    Years: 55.00    Types: Cigarettes    Quit date: 08/03/2016  . Smokeless tobacco: Never Used  . Alcohol use No  . Drug use: No  . Sexual activity: No   Other Topics Concern  . Not on file   Social History Narrative  . No narrative on file    No Known Allergies  Family History  Problem Relation Age of Onset  . Heart disease Mother   . Heart attack Mother        massive  . COPD Father   . Emphysema Father   . Down syndrome Brother     Prior to Admission medications   Medication Sig Start Date End Date Taking? Authorizing Provider  aspirin 81 MG tablet Take 81 mg by mouth daily.   Yes [provider]  atorvastatin (LIPITOR) 80 MG tablet Take 1 tablet (80 mg total) by mouth daily at 6 PM. 08/08/16  Yes Dessa Phi Chahn-Yang, DO  bisacodyl (DULCOLAX) 5 MG EC tablet Take 2 tablets (10 mg total) by mouth daily. Patient taking differently: Take 10 mg by mouth daily as needed for mild constipation or moderate constipation.  08/09/16  Yes Dessa Phi Chahn-Yang, DO  calcium carbonate (OS-CAL) 600 MG TABS tablet Take 600 mg by mouth every other day.    Yes [provider]  carvedilol (COREG) 6.25 MG tablet Take 1 tablet (6.25 mg total) by mouth 2 (two) times daily with a meal. 08/08/16  Yes Dessa Phi Chahn-Yang, DO  docusate sodium (COLACE) 100 MG capsule Take 2 capsules (200 mg total) by mouth 2 (two) times daily. Patient taking differently: Take 200 mg by mouth daily as needed.   08/08/16  Yes Dessa Phi Chahn-Yang, DO  furosemide (LASIX) 20 MG tablet Take 1 tablet (20 mg total) by mouth daily. 08/11/16 09/10/16 Yes Dessa Phi Chahn-Yang, DO  hydrALAZINE (APRESOLINE) 50 MG tablet Take 1 tablet (50 mg total) by mouth every 8 (eight) hours. 08/08/16  Yes Dessa Phi Chahn-Yang, DO  isosorbide mononitrate (IMDUR) 30 MG 24 hr tablet Take 1 tablet (30 mg total) by mouth daily. 08/09/16  Yes Dessa Phi Chahn-Yang, DO  levothyroxine (SYNTHROID, LEVOTHROID) 88 MCG tablet Take 1 tablet (88 mcg total) by mouth daily before breakfast. 04/23/16  Yes Hassell Done, Mary-Margaret, FNP  meclizine (ANTIVERT) 25 MG tablet Take 1 tablet (25 mg total) by mouth 4 (four) times daily as needed for dizziness. 05/07/16  Yes Hawks, Christy A, FNP  nicotine (NICODERM CQ - DOSED IN MG/24 HOURS) 21 mg/24hr patch Place 1 patch (21 mg total) onto the skin daily. 08/09/16  Yes Dessa Phi Chahn-Yang, DO  Omega-3 Fatty Acids (FISH OIL) 1000 MG CAPS Take 1 capsule by mouth every other day.    Yes [provider]  polyethylene glycol (MIRALAX / GLYCOLAX) packet Take 17 g by mouth 2 (two) times daily. Patient taking differently: Take 17 g by mouth daily as needed for mild constipation or moderate constipation.  08/08/16  Yes Choi, Truitt Cruey Chahn-Yang, DO  ONETOUCH DELICA LANCETS 06Y MISC Use to check BG once a day.  Dx:E11.9 type 2 DM. 10/26/15   Cherre Robins, PharmD  [facility] VERIO test strip TEST ONCE A DAY 05/02/16   Chevis Pretty, FNP    Physical Exam: Vitals:   09/04/16 1626 09/04/16 1630 09/04/16 1700 09/04/16 1730  BP:   122/62 123/61  Pulse: 65 64 64 64  Resp: 19 16 18 16   Temp:      TempSrc:      SpO2: 94% 94% 93% 93%     General: Appears frail and chronically ill but is in NAD Eyes:  PERRL, EOMI, normal lids, iris ENT:  grossly normal hearing, lips & tongue, mmm Neck:  no LAD, masses or thyromegaly Cardiovascular:  RRR, 2/6 systolic murmur, no r/g. No LE edema.  Respiratory:  CTA bilaterally, no w/r/r. Normal respiratory effort. Abdomen:  soft, ntnd, NABS Skin:  no rash or induration seen on limited exam Musculoskeletal:  grossly normal tone BUE/BLE, good ROM, no bony abnormality Psychiatric:  Blunted mood and affect, speech fluent and appropriate, AOx3 Neurologic:  CN 2-12 grossly intact, moves all extremities in coordinated fashion, sensation intact  Labs on Admission: I have personally reviewed following labs and imaging studies  CBC:  Recent Labs Lab 09/04/16 1458  WBC  13.3*  NEUTROABS 10.9*  HGB 6.5*  HCT 19.0*  MCV 92.7  PLT 277   Basic Metabolic Panel:  Recent Labs Lab 09/04/16 1458  NA 131*  K 4.3  CL 96*  CO2 22  GLUCOSE 369*  BUN 88*  CREATININE 3.24*  CALCIUM 8.8*   GFR: CrCl cannot be calculated (Unknown ideal weight.). Liver Function Tests: No results for input(s): AST, ALT, ALKPHOS, BILITOT, PROT, ALBUMIN in the last 168 hours. No results for input(s): LIPASE, AMYLASE in the last 168 hours. No results for input(s): AMMONIA in the last 168 hours. Coagulation Profile: No results for input(s): INR, PROTIME in the last 168 hours. Cardiac Enzymes:  Recent Labs Lab 09/04/16 1504  TROPONINI 0.07*   BNP (last 3 results) No results for input(s): PROBNP in the last 8760 hours. HbA1C: No results for input(s): HGBA1C in the last 72 hours. CBG:  Recent Labs Lab 09/04/16 1425  GLUCAP 350*   Lipid Profile: No results for input(s): CHOL, HDL, LDLCALC, TRIG, CHOLHDL, LDLDIRECT in the last 72 hours. Thyroid Function Tests: No results for input(s): TSH, T4TOTAL, FREET4, T3FREE, THYROIDAB in the last 72 hours. Anemia Panel:  Recent Labs  09/04/16 1622  RETICCTPCT 20.0*   Urine analysis: No results found for: COLORURINE, APPEARANCEUR, LABSPEC, PHURINE, GLUCOSEU, HGBUR, BILIRUBINUR, KETONESUR, PROTEINUR, UROBILINOGEN, NITRITE, LEUKOCYTESUR  Creatinine Clearance: CrCl cannot be calculated (Unknown ideal  weight.).  Sepsis Labs: @LABRCNTIP (procalcitonin:4,lacticidven:4) )No results found for this or any previous visit (from the past 240 hour(s)).   Radiological Exams on Admission: Dg Chest 2 View  Result Date: 09/04/2016 CLINICAL DATA:  Fall EXAM: CHEST  2 VIEW COMPARISON:  08/04/2016 FINDINGS: Chronic lung disease with hyperinflation and prominent lung markings bilaterally. No focal consolidation. Negative for heart failure or effusion. No acute fracture is identified. IMPRESSION: Chronic lung disease. No acute abnormality. Improved aeration since the prior study. Electronically Signed   By: Franchot Gallo M.D.   On: 09/04/2016 16:02   Ct Head Wo Contrast  Result Date: 09/04/2016 CLINICAL DATA:  Generalized weakness. Vertigo and dizziness. 2 falls in the past month. Weight loss. EXAM: CT HEAD WITHOUT CONTRAST TECHNIQUE: Contiguous axial images were obtained from the base of the skull through the vertex without intravenous contrast. COMPARISON:  None. FINDINGS: Brain: Diffusely enlarged ventricles and subarachnoid spaces. Patchy white matter low density in both cerebral hemispheres. No intracranial hemorrhage, mass lesion or CT evidence of acute infarction. Vascular: No hyperdense vessel or unexpected calcification. Skull: Mild bilateral hyperostosis frontalis. Sinuses/Orbits: Bilateral lens implants. Unremarkable included paranasal sinuses. Other:  None. IMPRESSION: 1. No acute abnormality. 2. Mild to moderate diffuse cerebral and cerebellar atrophy. 3. Moderate chronic small vessel white matter ischemic changes in both cerebral hemispheres. Electronically Signed   By: Claudie Revering M.D.   On: 09/04/2016 16:21    EKG: Independently reviewed.  NSR with rate 68; nonspecific ST changes with no evidence of acute ischemia  Assessment/Plan Principal Problem:   Symptomatic anemia Active Problems:   HTN (hypertension)   Hypothyroidism   Hyperlipidemia   DM2 (diabetes mellitus, type 2) (HCC)   Kidney  disease, chronic, stage III (moderate, EGFR 30-59 ml/min)   Elevated troponin   Muscular deconditioning   Chronic combined systolic (congestive) and diastolic (congestive) heart failure (HCC)   Symptomatic anemia -Patient with prior recent admission for acute on chronic diastolic heart failure -During that admission, she had mildly elevated BUN that has worsened -She also had anemia that has worsened -Hgb 6.5; 9.9 on 6/30 -MCV 92.7,  RDW 25.1 -MCV is 80-100, indicative of normocytic anemia -This generally occurs in chronic disease and acute blood loss anemia -Heme testing was done in the ER and negative; she denies bleeding; acute blood loss anemia seems unlikely. -Instead, this is likely due to bone marrow suppression and anemia of chronic disease. -Anemia panel is pending. -Will admit to med surg bed. -Transfuse 2 units PRBC to start and recheck Hgb afterwards.  -Will give Lasix between units due to h/o CHF requiring recent admission. -Patient counseled about short- and long-term risks associated with transfusion and consents to receive blood products. -WBC 13.3 - likely reactive, will trend  CKD -While reported creatinine prior to last admission was 1.5, her creatinine was in the mid-2 range for most of her last hospitalization. -BUN 88/Creatinine 3.24/GFR 13; 66/2.81/15 on 7/4 -Na++ 131 -There likely is a component of AKI from her poor PO intake -Rehydration with blood products and follow -Avoid aggressive hydration efforts due to markedly depressed EF  Heart failure -Patient with recent hospitalization for this issue -Appears to be compensated at this time -Continue home Imdur and Coreg for now -Hold Lasix due to AKI -No plans to consult cardiology at this time  DM -Glucose 369 -A1c 4.8 on 6/30 -She is not on medication at home (for obvious reasons) -It is not clear why her glucose is so high currently -Will cover with sensitive scale SSI  HTN -Continue home meds -  Coreg, hydralazine -If BP is soft, these may need to be held  HLD -On Lipitor 80 mg -Lipid profile on 3/19: TC 129/HDL 45/LDL 62/TG 108 - at goal  Elevated troponin -Troponin 0.07 -Likely related to renal dysfunction -Will trend  Hypothyroidism -Normal TSH on 6/30 -Continue Synthroid at current dose  Deconditioning -No PT/OT consults during prior hospitalization -Will consult both now, as well as nutrition -SW consult, as well, as patient seems likely to need SNF at the time of discharge   DVT prophylaxis:  SCDs Code Status:  Full - confirmed with patient/family Family Communication: Sister and nephew present throughout evaluation Disposition Plan:  Likely to need SNF Consults called: PT/OT/Nutrition/SW  Admission status: Admit - It is my clinical opinion that admission to INPATIENT is reasonable and necessary because this patient will require at least 2 midnights in the hospital to treat this condition based on the medical complexity of the problems presented.  Given the aforementioned information, the predictability of an adverse outcome is felt to be significant.    Karmen Bongo MD [facility]  If 7PM-7AM, please contact night-coverage [email or web address] Password [email or web address]  09/04/2016, 6:20 PM

## 2016-09-04 NOTE — ED Notes (Signed)
Report to Nicolle, RN 

## 2016-09-04 NOTE — ED Notes (Signed)
O2/2L applied for increased oxygenation

## 2016-09-05 DIAGNOSIS — N179 Acute kidney failure, unspecified: Secondary | ICD-10-CM

## 2016-09-05 DIAGNOSIS — N185 Chronic kidney disease, stage 5: Secondary | ICD-10-CM

## 2016-09-05 DIAGNOSIS — D649 Anemia, unspecified: Secondary | ICD-10-CM

## 2016-09-05 LAB — TROPONIN I
Troponin I: 0.07 ng/mL (ref <0.03)
Troponin I: 0.08 ng/mL (ref <0.03)
Troponin I: 0.08 ng/mL (ref <0.03)

## 2016-09-05 LAB — CBC
HEMATOCRIT: 17.8 % — AB (ref 36.0–46.0)
Hemoglobin: 6 g/dL — CL (ref 12.0–15.0)
MCH: 31.4 pg (ref 26.0–34.0)
MCHC: 33.7 g/dL (ref 30.0–36.0)
MCV: 93.2 fL (ref 78.0–100.0)
PLATELETS: 161 10*3/uL (ref 150–400)
RBC: 1.91 MIL/uL — ABNORMAL LOW (ref 3.87–5.11)
RDW: 26.1 % — AB (ref 11.5–15.5)
WBC: 11.6 10*3/uL — AB (ref 4.0–10.5)

## 2016-09-05 LAB — GLUCOSE, CAPILLARY
GLUCOSE-CAPILLARY: 261 mg/dL — AB (ref 65–99)
Glucose-Capillary: 168 mg/dL — ABNORMAL HIGH (ref 65–99)
Glucose-Capillary: 196 mg/dL — ABNORMAL HIGH (ref 65–99)
Glucose-Capillary: 194 mg/dL — ABNORMAL HIGH (ref 65–99)

## 2016-09-05 LAB — BASIC METABOLIC PANEL
Anion gap: 12 (ref 5–15)
BUN: 84 mg/dL — AB (ref 6–20)
CHLORIDE: 97 mmol/L — AB (ref 101–111)
CO2: 24 mmol/L (ref 22–32)
CREATININE: 2.88 mg/dL — AB (ref 0.44–1.00)
Calcium: 8.4 mg/dL — ABNORMAL LOW (ref 8.9–10.3)
GFR calc Af Amer: 17 mL/min — ABNORMAL LOW (ref >60)
GFR calc non Af Amer: 15 mL/min — ABNORMAL LOW (ref >60)
GLUCOSE: 177 mg/dL — AB (ref 65–99)
POTASSIUM: 3.5 mmol/L (ref 3.5–5.1)
Sodium: 133 mmol/L — ABNORMAL LOW (ref 135–145)

## 2016-09-05 MED ORDER — GLUCERNA SHAKE PO LIQD
237.0000 mL | Freq: Three times a day (TID) | ORAL | Status: DC
  Administered 2016-09-05 – 2016-09-08 (×9): 237 mL via ORAL

## 2016-09-05 NOTE — Progress Notes (Addendum)
Schorr, NP notified of critical Troponin 0.08 at this time. Schorr, NP also notified of patient not receiving PRBC yet d/t the [facility]. Patient has remained bedrest, using bedpan when needed. No distress noted. Verification of whether or not patient needs cardiac monitoring. Awaiting response. Dr. Sharl MaLama text paged d/t time being after 0100.

## 2016-09-05 NOTE — NC FL2 (Signed)
[place] LEVEL OF CARE SCREENING TOOL     IDENTIFICATION  Patient Name: Michele Walters Birthdate: Dec 18, 1935 Sex: female Admission Date (Current Location): 09/04/2016  [facility] and [place] Number:  [employer] and Address:  [place] [address] [place]      Provider Number: [id number]  Attending Physician Name and Address:  Orvan Falconer, MD  Relative Name and Phone Number:       Current Level of Care: Hospital Recommended Level of Care: [place] Prior Approval Number:    Date Approved/Denied:   PASRR Number: [id number]  Discharge Plan: SNF    Current Diagnoses: Patient Active Problem List   Diagnosis Date Noted  . Symptomatic anemia 09/04/2016  . Elevated troponin 09/04/2016  . Muscular deconditioning 09/04/2016  . Chronic combined systolic (congestive) and diastolic (congestive) heart failure ([place]) 09/04/2016  . Acute congestive heart failure ([place])   . Chest pain   . LBBB (left bundle branch block)   . Acute on chronic diastolic CHF (congestive heart failure) ([place]) 08/04/2016  . Acute hypercapnic respiratory failure ([place]) 08/04/2016  . Acute respiratory failure with hypoxia ([place])   . Smoker 11/23/2014  . Kidney disease, chronic, stage III (moderate, EGFR 30-59 ml/min) 10/20/2014  . DM2 (diabetes mellitus, type 2) ([place]) 10/15/2012  . HTN (hypertension) 05/06/2010  . Hypothyroidism 05/06/2010  . Hyperlipidemia 05/06/2010    Orientation RESPIRATION BLADDER Height & Weight     Self, Time, Situation, Place  Normal Incontinent Weight: 114 lb 11.2 oz (52 kg) Height:  5' 2"  (157.5 cm)  BEHAVIORAL SYMPTOMS/MOOD NEUROLOGICAL BOWEL NUTRITION STATUS      Continent Diet (regular, see DC summary)  AMBULATORY STATUS COMMUNICATION OF NEEDS Skin   Extensive Assist Verbally Normal                       Personal Care Assistance Level of Assistance  Bathing, Feeding, Dressing Bathing Assistance:  Limited assistance Feeding assistance: Independent Dressing Assistance: Limited assistance     Functional Limitations Info  Sight, Hearing, Speech Sight Info: Adequate Hearing Info: Impaired Speech Info: Adequate    SPECIAL CARE FACTORS FREQUENCY  PT (By licensed PT), OT (By licensed OT)     PT Frequency: 5x a week  OT Frequency: 5x a week            Contractures Contractures Info: Not present    Additional Factors Info  Code Status, Allergies Code Status Info: Full Code Allergies Info: NKA           Current Medications (09/05/2016):  This is the current hospital active medication list Current Facility-Administered Medications  Medication Dose Route Frequency Provider Last Rate Last Dose  . 0.9 %  sodium chloride infusion  10 mL/hr Intravenous Once Varney Biles, MD 10 mL/hr at 09/04/16 2000 10 mL/hr at 09/04/16 2000  . acetaminophen (TYLENOL) tablet 650 mg  650 mg Oral Q6H PRN Karmen Bongo, MD       Or  . acetaminophen (TYLENOL) suppository 650 mg  650 mg Rectal Q6H PRN Karmen Bongo, MD      . atorvastatin (LIPITOR) tablet 80 mg  80 mg Oral q1800 Orvan Falconer, MD      . bisacodyl (DULCOLAX) EC tablet 10 mg  10 mg Oral Daily PRN Karmen Bongo, MD      . carvedilol (COREG) tablet 6.25 mg  6.25 mg Oral BID WC Karmen Bongo, MD   6.25 mg at 09/05/16  0820  . docusate sodium (COLACE) capsule 100 mg  100 mg Oral BID Karmen Bongo, MD   100 mg at 09/05/16 9518  . hydrALAZINE (APRESOLINE) tablet 50 mg  50 mg Oral Lynne Logan, MD   50 mg at 09/05/16 0533  . insulin aspart (novoLOG) injection 0-5 Units  0-5 Units Subcutaneous QHS Karmen Bongo, MD   3 Units at 09/04/16 2226  . insulin aspart (novoLOG) injection 0-9 Units  0-9 Units Subcutaneous TID WC Karmen Bongo, MD   2 Units at 09/05/16 [phone]  . isosorbide mononitrate (IMDUR) 24 hr tablet 30 mg  30 mg Oral Daily Karmen Bongo, MD   30 mg at 09/05/16 6063  . levothyroxine (SYNTHROID, LEVOTHROID) tablet  88 mcg  88 mcg Oral QAC breakfast Karmen Bongo, MD   88 mcg at 09/05/16 0160  . meclizine (ANTIVERT) tablet 25 mg  25 mg Oral QID PRN Karmen Bongo, MD      . nicotine (NICODERM CQ - dosed in mg/24 hours) patch 21 mg  21 mg Transdermal Daily Karmen Bongo, MD   21 mg at 09/05/16 1093  . ondansetron (ZOFRAN) tablet 4 mg  4 mg Oral Q6H PRN Karmen Bongo, MD       Or  . ondansetron [facility]) injection 4 mg  4 mg Intravenous Q6H PRN Karmen Bongo, MD      . polyethylene glycol (MIRALAX / GLYCOLAX) packet 17 g  17 g Oral Daily PRN Karmen Bongo, MD         Discharge Medications: Please see discharge summary for a list of discharge medications.  Relevant Imaging Results:  Relevant Lab Results:   Additional Information SSN:  [id number]  Michele Walters, [place]

## 2016-09-05 NOTE — Progress Notes (Signed)
Inpatient Diabetes Program Recommendations  AACE/ADA: New Consensus Statement on Inpatient Glycemic Control (2015)  Target Ranges:  Prepandial:   less than 140 mg/dL      Peak postprandial:   less than 180 mg/dL (1-2 hours)      Critically ill patients:  140 - 180 mg/dL   Results for Michele Walters, Michele Walters (MRN [id number]) as of 09/05/2016 07:47  Ref. Range 09/04/2016 14:25 09/04/2016 21:06 09/05/2016 07:37  Glucose-Capillary Latest Ref Range: 65 - 99 mg/dL 409350 (H) 811290 (H) 914168 (H)   Review of Glycemic Control  Diabetes history: DM2 Outpatient Diabetes medications: None Current orders for Inpatient glycemic control: Novolog 0-9 units TID with meals, Novolog 0-5 units QHS  Inpatient Diabetes Program Recommendations:  Outpatient Oral DM medication: Noted initial glucose 350 mg/dl yesterday. In reviewing the chart, noted patient was taking Metformin XR 500 mg QAM which was recently discontinued on 08/08/16 at last hospital discharge due to acute kidney injury and A1C of 4.8% on 08/04/16. Patient may need oral DM medication prescribed at discharge.  Thanks, Orlando PennerMarie Bryttney Netzer, RN, MSN, CDE Diabetes Coordinator Inpatient Diabetes Program [phone] (Team Pager from 8am to 5pm)

## 2016-09-05 NOTE — Progress Notes (Signed)
CRITICAL VALUE ALERT  Critical Value:  Hgb 6  Date & Time Notied:  09/05/16 0754  Provider Notified: Dr. Conley RollsLe  Orders Received/Actions taken: [facility].

## 2016-09-05 NOTE — Progress Notes (Signed)
OT Cancellation Note  Patient Details Name: Gretel AcreLaura B Clardy MRN: [id number] DOB: 12/10/1935   Cancelled Treatment:    Reason Eval/Treat Not Completed: Medical issues which prohibited therapy. Patient is on bedrest and currently waiting for a blood transfusion. Troponin level trending upward at this time. Will re-attempt OT evaluation tomorrow.    Limmie PatriciaLaura Essenmacher, OTR/L,CBIS  [phone] 09/05/2016, 8:53 AM

## 2016-09-05 NOTE — Clinical Social Work Note (Signed)
Clinical Social Work Assessment  Patient Details  Name: Michele Walters MRN: [id number] Date of Birth: 09/04/1935  Date of referral:  09/05/16               Reason for consult:  Facility Placement, Discharge Planning, [facility]                Permission sought to share information with:  Sports coachCase Manager, Magazine features editoracility Contact Representative, Family Supports Permission granted to share information::  Yes, Verbal Permission Granted  Name::        Agency::     Relationship::  2 sisters at bedside during assessment  Contact Information:     Housing/Transportation Living arrangements for the past 2 months:  Single Family Home Source of Information:  Patient, Medical Team, Case Manager, Other (Comment Required) (sisters) Patient Interpreter Needed:  None Criminal Activity/Legal Involvement Pertinent to Current Situation/Hospitalization:  No - Comment as needed Significant Relationships:  Adult Children, Other Family Members, Community Support, Friend, Siblings Lives with:  Self Do you feel safe going back to the place where you live?  No Need for family participation in patient care:  Yes (Comment)  Care giving concerns:  Patient admitted from home where she was feeling weaker and more deconditioned as she reports she has vertigo and anemia.  Reports she has been independent at baseline up until late June 2018.  Reports she was still driving, living alone, and ind with all ADLs.  [facility] and sent home and family reports she has just not recovered back to baseline.  At this time they are wanting a recommendation for SNF for short term rehab to improve strength and back to baseline.  Sisters are both in room and agreeable to plan along with patient.     Social Worker assessment / plan:  Assessment completed for SNF Patient agreeable and SNF work up completed. Requesting [facility] if bed available due to conveinece for family and sisters.  Patient  medically still acute and LCSW will continue to follow and assist with DC planning.  Plan: SNF at DC.  Employment status:  Retired Database administratornsurance information:  Managed Medicare PT Recommendations:  Skilled Nursing Facility Information / Referral to community resources:  Skilled Nursing Facility  Patient/Family's Response to care:  Agreeable and understanding  Patient/Family's Understanding of and Emotional Response to Diagnosis, Current Treatment, and Prognosis:  Patient HOH, but aware of reason for admission and current decline in health. She is optimistic to complete rehab and return to baseline.  Emotional Assessment Appearance:  Appears stated age Attitude/Demeanor/Rapport:    Affect (typically observed):  Accepting, Adaptable, Pleasant Orientation:  Oriented to Self, Oriented to Place, Oriented to  Time, Oriented to Situation Alcohol / Substance use:  Tobacco Use (recently quit, using a patch) Psych involvement (Current and /or in the community):  No (Comment)  Discharge Needs  Concerns to be addressed:  Care Coordination Readmission within the last 30 days:  Yes Current discharge risk:  None Barriers to Discharge:  No Barriers Identified, Continued Medical Work up   Raye SorrowCoble, [place] N, LCSW 09/05/2016, 10:56 AM

## 2016-09-05 NOTE — Progress Notes (Addendum)
PT Cancellation Note  Patient Details Name: Michele AcreLaura B Wierzbicki MRN: [id number] DOB: 07/23/1935   Cancelled Treatment:    Reason Eval/Treat Not Completed: Patient not medically ready. Chart reviewed, RN consulted. Pt anemic, awaiting transfusion, Hb outside of safe range for OOB assessment. Of note: pt also with up-trending Troponins, which will need to be flat, down trending or documented as unrelated to ACS prior to working with PT. Will monitor remotely and attempt again at later date time.   11:21 AM, 09/05/16 Rosamaria LintsAllan C Buccola, PT, DPT Physical Therapist - [place] [phone](ASCOM)  [phone] (Office)    Buccola,Allan C 09/05/2016, 11:19 AM

## 2016-09-05 NOTE — Clinical Social Work Placement (Signed)
   CLINICAL SOCIAL WORK PLACEMENT  NOTE  Date:  09/05/2016  Patient Details  Name: Michele Walters MRN: [id number] Date of Birth: 07/04/1935  Clinical Social Work is seeking post-discharge placement for this patient at the Skilled  Nursing Facility level of care (*CSW will initial, date and re-position this form in  chart as items are completed):  Yes   Patient/family provided with [facility] Clinical Social Work Department's list of facilities offering this level of care within the geographic area requested by the patient (or if unable, by the patient's family).  Yes   Patient/family informed of their freedom to choose among providers that offer the needed level of care, that participate in Medicare, Medicaid or managed care program needed by the patient, have an available bed and are willing to accept the patient.  Yes   Patient/family informed of [facility]'s ownership interest in [facility], as well as of the fact that they are under no obligation to receive care at these facilities.  PASRR submitted to EDS on 09/05/16     PASRR number received on 09/05/16     Existing PASRR number confirmed on       FL2 transmitted to all facilities in geographic area requested by pt/family on 09/05/16     FL2 transmitted to all facilities within larger geographic area on       Patient informed that his/her managed care company has contracts with or will negotiate with certain facilities, including the following:            Patient/family informed of bed offers received.  Patient chooses bed at       Physician recommends and patient chooses bed at      Patient to be transferred to   on  .  Patient to be transferred to facility by       Patient family notified on   of transfer.  Name of family member notified:        PHYSICIAN Please sign FL2     Additional Comment:    _______________________________________________ Raye Sorrowoble, Aniyha Tate N, LCSW 09/05/2016, 11:09  AM

## 2016-09-05 NOTE — Progress Notes (Signed)
Initial Nutrition Assessment  DOCUMENTATION CODES:     INTERVENTION:  Glucerna Shake po TID, each supplement provides 220 kcal and 10 grams of protein  Heart Healthy/CHO modified diet   Encourage meal/supplement intake   Recommend: Screen for depression  NUTRITION DIAGNOSIS:   Inadequate oral intake related to poor appetite as evidenced by meal completion < 50%.   GOAL:   Patient will meet greater than or equal to 90% of their needs   MONITOR:   PO intake, Supplement acceptance, Labs, Weight trends, Skin  REASON FOR ASSESSMENT:   Consult Assessment of nutrition requirement/status  ASSESSMENT:  [name] is a 81 yo with a hx of CKD-3, CHF, DM-2 who presents with anemia. Hemoglobin of 6.0 this morning and has received 1 unit of blood.  Her home diet is regular and is able to feed herself. She is lying in a fetal position and seems reluctant to answer questions. She does endorse a fair-poor appetite. Meal intake observed for lunch 25-50% and 120 ml fluid.    Weight loss noted for the past 2 months - a decrease of 9# or 7%. Her weight last year at this time was 58-60 kg.  Unable to complete Nutrition-Focused physical exam deferred.   Recent Labs Lab 09/04/16 1458 09/05/16 0555  NA 131* 133*  K 4.3 3.5  CL 96* 97*  CO2 22 24  BUN 88* 84*  CREATININE 3.24* 2.88*  CALCIUM 8.8* 8.4*  GLUCOSE 369* 177*  labs: Sodium 133,  BUN-84, Cr 2.88  Meds; SSI, Colace, Lipitor  Diet Order:  Diet heart healthy/carb modified Room service appropriate? Yes; Fluid consistency: Thin  Skin:   (Redness bilateral buttocks and abrasion to knee)  Last BM:  7/30   Height:   Ht Readings from Last 1 Encounters:  09/04/16 5\' 2"  (1.575 m)    Weight:   Wt Readings from Last 1 Encounters:  09/05/16 114 lb 11.2 oz (52 kg)    Ideal Body Weight:  50 kg  BMI:  Body mass index is 20.98 kg/m.  Estimated Nutritional Needs:   Kcal:  1600-1792 (25-28 kcal/kg- SBW)  Protein:   51-58 gr  (0.8-0.9 gr/kg)   Fluid:  1.6 liters daily  EDUCATION NEEDS:   Education needs no appropriate at this time (pt is too lethargic to participate)  Royann ShiversLynn Lenee Franze MS,RD,CSG,LDN Office: [phone] Pager: [phone]

## 2016-09-05 NOTE — Progress Notes (Signed)
PROGRESS NOTE    Michele Walters  VEH:[id number] DOB: 07-18-1935 DOA: 09/04/2016 PCP: Chevis Pretty, FNP    Brief Narrative: 81 yo with hx of DM, HTN, hypothyroidism, CKD3, hx of combined CHF, admitted for symptomatic anemia.  She has dropped about 3 g of Hb in a few days, but no active sign of bleeding.  No evidence of hemolysis.  She has gotten 1 unit of PRBCs, and felt better.    Assessment & Plan:   Principal Problem:   Symptomatic anemia Active Problems:   HTN (hypertension)   Hypothyroidism   Hyperlipidemia   DM2 (diabetes mellitus, type 2) (HCC)   Kidney disease, chronic, stage III (moderate, EGFR 30-59 ml/min)   Elevated troponin   Muscular deconditioning   Chronic combined systolic (congestive) and diastolic (congestive) heart failure ([place])  1. Anemia:  Will transfuse her to Hb of 9 or 10 g per dL.  She has no active bleeding.  D/C ASA.  She will likely need a GI evaluation at some point. 2. Hypothyroidism:  Continue with supplement.  TSH is normal 3. DM:  Stable.  Will continue with SSI. 4. Elevated troponins:  Modest,  Likely demand.  WIll get Hb up with transfusion. 5. Chronic combined CHF:  Not decompensated.  Lasix with transfusion.    DVT prophylaxis: SCD. Code Status: FULL  CODE.  Family Communication: sister at bedside.  Disposition Plan: TBD.   Consultants:   None.   Procedures:   None.   Antimicrobials: Anti-infectives    None       Subjective:  Feeling better.   Objective: Vitals:   09/04/16 1949 09/04/16 2000 09/05/16 0500 09/05/16 0533  BP: 117/61 90/74  (!) 113/58  Pulse: 64 64  65  Resp: (!) 22 18  18   Temp:  97.8 F (36.6 C)  (!) 97.5 F (36.4 C)  TempSrc:    Oral  SpO2: 93% 96%  93%  Weight:  23.7 kg (52 lb 4.8 oz) 52 kg (114 lb 11.2 oz)   Height:  5' 2"  (1.575 m)      Intake/Output Summary (Last 24 hours) at 09/05/16 1013 Last data filed at 09/05/16 0900  Gross per 24 hour  Intake              590 ml  Output               500 ml  Net               90 ml   Filed Weights   09/04/16 2000 09/05/16 0500  Weight: 23.7 kg (52 lb 4.8 oz) 52 kg (114 lb 11.2 oz)    Examination:  General exam: Appears calm and comfortable  Respiratory system: Clear to auscultation. Respiratory effort normal. Cardiovascular system: S1 & S2 heard, RRR. No JVD, murmurs, rubs, gallops or clicks. No pedal edema. Gastrointestinal system: Abdomen is nondistended, soft and nontender. No organomegaly or masses felt. Normal bowel sounds heard. Central nervous system: Alert and oriented. No focal neurological deficits. Extremities: Symmetric 5 x 5 power. Skin: No rashes, lesions or ulcers Psychiatry: Judgement and insight appear normal. Mood & affect appropriate.   Data Reviewed: I have personally reviewed following labs and imaging studies  CBC:  Recent Labs Lab 09/04/16 1458 09/05/16 0555  WBC 13.3* 11.6*  NEUTROABS 10.9*  --   HGB 6.5* 6.0*  HCT 19.0* 17.8*  MCV 92.7 93.2  PLT 165 836   Basic Metabolic Panel:  Recent Labs Lab 09/04/16  1458 09/05/16 0555  NA 131* 133*  K 4.3 3.5  CL 96* 97*  CO2 22 24  GLUCOSE 369* 177*  BUN 88* 84*  CREATININE 3.24* 2.88*  CALCIUM 8.8* 8.4*   GFR: Estimated Creatinine Clearance: 12.7 mL/min (A) (by C-G formula based on SCr of 2.88 mg/dL (H)). Liver Function Tests:  Recent Labs Lab 09/04/16 1846  AST 57*  ALT 42  ALKPHOS 106  BILITOT 2.7*  PROT 6.0*  ALBUMIN 2.5*   Cardiac Enzymes:  Recent Labs Lab 09/04/16 1504 09/04/16 1846 09/05/16 0005 09/05/16 0556 09/05/16 0813  TROPONINI 0.07* 0.06* 0.08* 0.07* 0.08*    Recent Labs Lab 09/04/16 1425 09/04/16 2106 09/05/16 0737  GLUCAP 350* 290* 168*   Anemia Panel:  Recent Labs  09/04/16 1620 09/04/16 1622  VITAMINB12 735  --   FOLATE 12.6  --   FERRITIN 310*  --   TIBC 277  --   IRON 88  --   RETICCTPCT  --  20.0*   Radiology Studies: Dg Chest 2 View  Result Date: 09/04/2016 CLINICAL DATA:   Fall EXAM: CHEST  2 VIEW COMPARISON:  08/04/2016 FINDINGS: Chronic lung disease with hyperinflation and prominent lung markings bilaterally. No focal consolidation. Negative for heart failure or effusion. No acute fracture is identified. IMPRESSION: Chronic lung disease. No acute abnormality. Improved aeration since the prior study. Electronically Signed   By: Franchot Gallo M.D.   On: 09/04/2016 16:02   Ct Head Wo Contrast  Result Date: 09/04/2016 CLINICAL DATA:  Generalized weakness. Vertigo and dizziness. 2 falls in the past month. Weight loss. EXAM: CT HEAD WITHOUT CONTRAST TECHNIQUE: Contiguous axial images were obtained from the base of the skull through the vertex without intravenous contrast. COMPARISON:  None. FINDINGS: Brain: Diffusely enlarged ventricles and subarachnoid spaces. Patchy white matter low density in both cerebral hemispheres. No intracranial hemorrhage, mass lesion or CT evidence of acute infarction. Vascular: No hyperdense vessel or unexpected calcification. Skull: Mild bilateral hyperostosis frontalis. Sinuses/Orbits: Bilateral lens implants. Unremarkable included paranasal sinuses. Other:  None. IMPRESSION: 1. No acute abnormality. 2. Mild to moderate diffuse cerebral and cerebellar atrophy. 3. Moderate chronic small vessel white matter ischemic changes in both cerebral hemispheres. Electronically Signed   By: Claudie Revering M.D.   On: 09/04/2016 16:21    Scheduled Meds: . atorvastatin  80 mg Oral q1800  . carvedilol  6.25 mg Oral BID WC  . docusate sodium  100 mg Oral BID  . hydrALAZINE  50 mg Oral Q8H  . insulin aspart  0-5 Units Subcutaneous QHS  . insulin aspart  0-9 Units Subcutaneous TID WC  . isosorbide mononitrate  30 mg Oral Daily  . levothyroxine  88 mcg Oral QAC breakfast  . nicotine  21 mg Transdermal Daily   Continuous Infusions: . sodium chloride 10 mL/hr (09/04/16 2000)     LOS: 1 day   Michele Landrigan, MD FACP Hospitalist.   If 7PM-7AM, please contact  night-coverage [email or web address] Password [email or web address] 09/05/2016, 10:13 AM

## 2016-09-06 DIAGNOSIS — R748 Abnormal levels of other serum enzymes: Secondary | ICD-10-CM

## 2016-09-06 LAB — GLUCOSE, CAPILLARY
GLUCOSE-CAPILLARY: 146 mg/dL — AB (ref 65–99)
Glucose-Capillary: 163 mg/dL — ABNORMAL HIGH (ref 65–99)
Glucose-Capillary: 191 mg/dL — ABNORMAL HIGH (ref 65–99)
Glucose-Capillary: 196 mg/dL — ABNORMAL HIGH (ref 65–99)

## 2016-09-06 LAB — CBC
HEMATOCRIT: 29.6 % — AB (ref 36.0–46.0)
HEMOGLOBIN: 10.4 g/dL — AB (ref 12.0–15.0)
MCH: 31.8 pg (ref 26.0–34.0)
MCHC: 35.1 g/dL (ref 30.0–36.0)
MCV: 90.5 fL (ref 78.0–100.0)
Platelets: 124 10*3/uL — ABNORMAL LOW (ref 150–400)
RBC: 3.27 MIL/uL — ABNORMAL LOW (ref 3.87–5.11)
RDW: 21.9 % — ABNORMAL HIGH (ref 11.5–15.5)
WBC: 9.7 10*3/uL (ref 4.0–10.5)

## 2016-09-06 MED ORDER — FUROSEMIDE 10 MG/ML IJ SOLN
20.0000 mg | Freq: Once | INTRAMUSCULAR | Status: AC
  Administered 2016-09-06: 20 mg via INTRAVENOUS
  Filled 2016-09-06: qty 2

## 2016-09-06 NOTE — Progress Notes (Signed)
PT Cancellation Note  Patient Details Name: Michele AcreLaura B Kushnir MRN: [id number] DOB: 02/03/1936   Cancelled Treatment:    Reason Eval/Treat Not Completed: Other (comment).  Pt refused, stating she wanted her sister to be in attendance but she is not here til later.  Told pt that we would likely see her tomorrow as PT is nearly finished.  Pt also has some "vestibular/vertigo" symptoms and may need screening for a PT/OT with vestibular evaluation skills.   Ivar DrapeRuth E Lorre Opdahl 09/06/2016, 4:25 PM   Samul Dadauth Kazimir Hartnett, PT MS Acute Rehab Dept. Number: [facility] [phone] and [facility] [phone]

## 2016-09-06 NOTE — Progress Notes (Signed)
PROGRESS NOTE    Michele Walters  VEL:[id number] DOB: 22-Oct-1935 DOA: 09/04/2016 PCP: Chevis Pretty, FNP    Brief Narrative: 81 yo with hx of DM, HTN, hypothyroidism, CKD3, hx of combined CHF, admitted for symptomatic anemia.  She has dropped about 3 g of Hb in a few days, but no active sign of bleeding.  No evidence of hemolysis.  She has gotten 1 unit of PRBCs, and felt better.  After 2 units of PRBCs, her Hb is now above 10 grams per dL.  She is awaiting SNF placement.     Assessment & Plan:   Principal Problem:   Symptomatic anemia Active Problems:   HTN (hypertension)   Hypothyroidism   Hyperlipidemia   DM2 (diabetes mellitus, type 2) (HCC)   Kidney disease, chronic, stage III (moderate, EGFR 30-59 ml/min)   Elevated troponin   Muscular deconditioning   Chronic combined systolic (congestive) and diastolic (congestive) heart failure ([place])   1. Anemia:  Have received 2 units of PRBCs and feeling better. She has no active bleeding.  D/C ASA.  She will likely need a GI evaluation at some point outpatient, and was told of this. 2. Hypothyroidism:  Continue with supplement.  TSH is normal 3. DM:  Stable.  Will continue with SSI. 4. Elevated troponins:  Modest,  Likely demand.  WIll get Hb up with transfusion. 5. Chronic combined CHF:  Not decompensated.  Lasix with transfusion.   DVT prophylaxis: SCD. Code Status: FULL CODE.  Family Communication: sister at bedside.  Disposition Plan:  SNF.  Consultants:   None.   Procedures:   None.   Antimicrobials: Anti-infectives    None       Subjective: "I feel about the same"   Objective: Vitals:   09/06/16 0335 09/06/16 0350 09/06/16 0604 09/06/16 0642  BP: 138/64 139/65 (!) 134/53 140/67  Pulse: (!) 59 (!) 59 60 63  Resp: _0 Temp: 97.6 F (36.4 C) 97.7 F (36.5 C) 97.7 F (36.5 C) 97.7 F (36.5 C)  TempSrc: Oral Oral Oral Oral  SpO2: 95% 97% 96% 96%  Weight:      Height:         Intake/Output Summary (Last 24 hours) at 09/06/16 1059 Last data filed at 09/06/16 2585  Gross per 24 hour  Intake             1129 ml  Output              400 ml  Net              729 ml   Filed Weights   09/04/16 2000 09/05/16 0500  Weight: 23.7 kg (52 lb 4.8 oz) 52 kg (114 lb 11.2 oz)    Examination:  General exam: Appears calm and comfortable  Respiratory system: Clear to auscultation. Respiratory effort normal. Cardiovascular system: S1 & S2 heard, RRR. No JVD, murmurs, rubs, gallops or clicks. No pedal edema. Gastrointestinal system: Abdomen is nondistended, soft and nontender. No organomegaly or masses felt. Normal bowel sounds heard. Central nervous system: Alert and oriented. No focal neurological deficits. Extremities: Symmetric 5 x 5 power. Skin: No rashes, lesions or ulcers Psychiatry: Judgement and insight appear normal. Mood & affect appropriate.   Data Reviewed: I have personally reviewed following labs and imaging studies  CBC:  Recent Labs Lab 09/04/16 1458 09/05/16 0555 09/06/16 0857  WBC 13.3* 11.6* 9.7  NEUTROABS 10.9*  --   --   HGB 6.5* 6.0*  10.4*  HCT 19.0* 17.8* 29.6*  MCV 92.7 93.2 90.5  PLT 165 161 540*   Basic Metabolic Panel:  Recent Labs Lab 09/04/16 1458 09/05/16 0555  NA 131* 133*  K 4.3 3.5  CL 96* 97*  CO2 22 24  GLUCOSE 369* 177*  BUN 88* 84*  CREATININE 3.24* 2.88*  CALCIUM 8.8* 8.4*   GFR: Estimated Creatinine Clearance: 12.7 mL/min (A) (by C-G formula based on SCr of 2.88 mg/dL (H)). Liver Function Tests:  Recent Labs Lab 09/04/16 1846  AST 57*  ALT 42  ALKPHOS 106  BILITOT 2.7*  PROT 6.0*  ALBUMIN 2.5*   Cardiac Enzymes:  Recent Labs Lab 09/04/16 1504 09/04/16 1846 09/05/16 0005 09/05/16 0556 09/05/16 0813  TROPONINI 0.07* 0.06* 0.08* 0.07* 0.08*   CBG:  Recent Labs Lab 09/05/16 0737 09/05/16 1131 09/05/16 1654 09/05/16 2046 09/06/16 0748  GLUCAP 168* 196* 261* 194* 163*   Anemia  Panel:  Recent Labs  09/04/16 1620 09/04/16 1622  VITAMINB12 735  --   FOLATE 12.6  --   FERRITIN 310*  --   TIBC 277  --   IRON 88  --   RETICCTPCT  --  20.0*   Radiology Studies: Dg Chest 2 View  Result Date: 09/04/2016 CLINICAL DATA:  Fall EXAM: CHEST  2 VIEW COMPARISON:  08/04/2016 FINDINGS: Chronic lung disease with hyperinflation and prominent lung markings bilaterally. No focal consolidation. Negative for heart failure or effusion. No acute fracture is identified. IMPRESSION: Chronic lung disease. No acute abnormality. Improved aeration since the prior study. Electronically Signed   By: Franchot Gallo M.D.   On: 09/04/2016 16:02   Ct Head Wo Contrast  Result Date: 09/04/2016 CLINICAL DATA:  Generalized weakness. Vertigo and dizziness. 2 falls in the past month. Weight loss. EXAM: CT HEAD WITHOUT CONTRAST TECHNIQUE: Contiguous axial images were obtained from the base of the skull through the vertex without intravenous contrast. COMPARISON:  None. FINDINGS: Brain: Diffusely enlarged ventricles and subarachnoid spaces. Patchy white matter low density in both cerebral hemispheres. No intracranial hemorrhage, mass lesion or CT evidence of acute infarction. Vascular: No hyperdense vessel or unexpected calcification. Skull: Mild bilateral hyperostosis frontalis. Sinuses/Orbits: Bilateral lens implants. Unremarkable included paranasal sinuses. Other:  None. IMPRESSION: 1. No acute abnormality. 2. Mild to moderate diffuse cerebral and cerebellar atrophy. 3. Moderate chronic small vessel white matter ischemic changes in both cerebral hemispheres. Electronically Signed   By: Claudie Revering M.D.   On: 09/04/2016 16:21    Scheduled Meds: . atorvastatin  80 mg Oral q1800  . carvedilol  6.25 mg Oral BID WC  . docusate sodium  100 mg Oral BID  . feeding supplement (GLUCERNA SHAKE)  237 mL Oral TID BM  . hydrALAZINE  50 mg Oral Q8H  . insulin aspart  0-5 Units Subcutaneous QHS  . insulin aspart   0-9 Units Subcutaneous TID WC  . isosorbide mononitrate  30 mg Oral Daily  . levothyroxine  88 mcg Oral QAC breakfast  . nicotine  21 mg Transdermal Daily   Continuous Infusions: . sodium chloride 10 mL/hr (09/04/16 2000)     LOS: 2 days   Alnisa Hasley, MD FACP Hospitalist.   If 7PM-7AM, please contact night-coverage [email or web address] Password [email or web address] 09/06/2016, 10:59 AM

## 2016-09-07 LAB — TYPE AND SCREEN
ABO/RH(D): AB POS
Antibody Screen: POSITIVE
DAT, IGG: POSITIVE
Unit division: 0
Unit division: 0

## 2016-09-07 LAB — BPAM RBC
BLOOD PRODUCT EXPIRATION DATE: 201809012359
BLOOD PRODUCT EXPIRATION DATE: 201809072359
ISSUE DATE / TIME: 201808012339
ISSUE DATE / TIME: 201808020324
UNIT TYPE AND RH: 6200
Unit Type and Rh: 6200

## 2016-09-07 LAB — GLUCOSE, CAPILLARY
GLUCOSE-CAPILLARY: 146 mg/dL — AB (ref 65–99)
GLUCOSE-CAPILLARY: 123 mg/dL — AB (ref 65–99)
Glucose-Capillary: 271 mg/dL — ABNORMAL HIGH (ref 65–99)
Glucose-Capillary: 189 mg/dL — ABNORMAL HIGH (ref 65–99)

## 2016-09-07 NOTE — Care Management Important Message (Signed)
Important Message  Patient Details  Name: Michele AcreLaura B Volkert MRN: [id number] Date of Birth: 04/24/1935   Medicare Important Message Given:  Yes    Johnie Makki, Chrystine OilerSharley Diane, RN 09/07/2016, 12:27 PM

## 2016-09-07 NOTE — Progress Notes (Signed)
LCSW following for disposition of needs. Bed offer and [facility] at [facility] when medically stable.  Will continue to follow and assist with discharge planning needs once ready.  Deretha EmoryHannah Errik Mitchelle LCSW, MSW Clinical Social Work: Optician, dispensingystem [employer] Coverage for :  [phone]

## 2016-09-07 NOTE — Progress Notes (Signed)
PROGRESS NOTE    Michele Walters  XBL:[id number] DOB: 07/17/1935 DOA: 09/04/2016 PCP: Chevis Pretty, FNP    Brief Narrative: 81 yo with hx of DM, HTN, hypothyroidism, CKD3, hx of combined CHF, admitted for symptomatic anemia. She has dropped about 3 g of Hb in a few days, but no active sign of bleeding. No evidence of hemolysis. She has gotten 1 unit of PRBCs, and felt better. After 2 units of PRBCs, her Hb is now above 10 grams per dL.  She is awaiting SNF placement. No new complaints.    Assessment & Plan:   Principal Problem:   Symptomatic anemia Active Problems:   HTN (hypertension)   Hypothyroidism   Hyperlipidemia   DM2 (diabetes mellitus, type 2) (HCC)   Kidney disease, chronic, stage III (moderate, EGFR 30-59 ml/min)   Elevated troponin   Muscular deconditioning   Chronic combined systolic (congestive) and diastolic (congestive) heart failure ([employer])  1. Anemia: Have received 2 units of PRBCs and feeling better.She has no active bleeding. D/C ASA. She will likely need a GI evaluation at some point outpatient, and was told of this (to exclude cancer).  2. Hypothyroidism: Continue with supplement. TSH is normal 3. DM: Stable. Will continue with SSI. 4. Elevated troponins: Modest, Likely demand. WIll get Hb up with transfusion. 5. Chronic combined CHF: Not decompensated. Lasix with transfusion.    DVT prophylaxis: SCD. Code Status: FULL CODE.  Family Communication: sister at bedside.  Disposition Plan:  SNF.  Consultants:   None.    Procedures:   None.   Antimicrobials: Anti-infectives    None       Subjective: "  I think I will make it"  Objective: Vitals:   09/06/16 0642 09/06/16 1439 09/06/16 2121 09/07/16 0450  BP: 140/67 (!) 113/58 (!) 107/57 132/61  Pulse: 63 62 61 67  Resp: _0 Temp: 97.7 F (36.5 C) 97.7 F (36.5 C) 97.9 F (36.6 C) 98 F (36.7 C)  TempSrc: Oral Oral Oral Oral  SpO2: 96% 95% 91% 93%  Weight:       Height:        Intake/Output Summary (Last 24 hours) at 09/07/16 0919 Last data filed at 09/06/16 2100  Gross per 24 hour  Intake              240 ml  Output              400 ml  Net             -160 ml   Filed Weights   09/04/16 2000 09/05/16 0500  Weight: 23.7 kg (52 lb 4.8 oz) 52 kg (114 lb 11.2 oz)    Examination:  General exam: Appears calm and comfortable  Respiratory system: Clear to auscultation. Respiratory effort normal. Cardiovascular system: S1 & S2 heard, RRR. No JVD, murmurs, rubs, gallops or clicks. No pedal edema. Gastrointestinal system: Abdomen is nondistended, soft and nontender. No organomegaly or masses felt. Normal bowel sounds heard. Central nervous system: Alert and oriented. No focal neurological deficits. Extremities: Symmetric 5 x 5 power. Skin: No rashes, lesions or ulcers Psychiatry: Judgement and insight appear normal. Mood & affect appropriate.   Data Reviewed: I have personally reviewed following labs and imaging studies  CBC:  Recent Labs Lab 09/04/16 1458 09/05/16 0555 09/06/16 0857  WBC 13.3* 11.6* 9.7  NEUTROABS 10.9*  --   --   HGB 6.5* 6.0* 10.4*  HCT 19.0* 17.8* 29.6*  MCV 92.7  93.2 90.5  PLT 165 161 735*   Basic Metabolic Panel:  Recent Labs Lab 09/04/16 1458 09/05/16 0555  NA 131* 133*  K 4.3 3.5  CL 96* 97*  CO2 22 24  GLUCOSE 369* 177*  BUN 88* 84*  CREATININE 3.24* 2.88*  CALCIUM 8.8* 8.4*   Liver Function Tests:  Recent Labs Lab 09/04/16 1846  AST 57*  ALT 42  ALKPHOS 106  BILITOT 2.7*  PROT 6.0*  ALBUMIN 2.5*   Cardiac Enzymes:  Recent Labs Lab 09/04/16 1504 09/04/16 1846 09/05/16 0005 09/05/16 0556 09/05/16 0813  TROPONINI 0.07* 0.06* 0.08* 0.07* 0.08*   CBG:  Recent Labs Lab 09/06/16 0748 09/06/16 1149 09/06/16 1634 09/06/16 2041 09/07/16 0815  GLUCAP 163* 191* 196* 146* 146*   Lipid Profile: Anemia Panel:  Recent Labs  09/04/16 1620 09/04/16 1622  VITAMINB12 735  --    FOLATE 12.6  --   FERRITIN 310*  --   TIBC 277  --   IRON 88  --   RETICCTPCT  --  20.0*   Sepsis Labs:  Radiology Studies: No results found.  Scheduled Meds: . atorvastatin  80 mg Oral q1800  . carvedilol  6.25 mg Oral BID WC  . docusate sodium  100 mg Oral BID  . feeding supplement (GLUCERNA SHAKE)  237 mL Oral TID BM  . hydrALAZINE  50 mg Oral Q8H  . insulin aspart  0-5 Units Subcutaneous QHS  . insulin aspart  0-9 Units Subcutaneous TID WC  . isosorbide mononitrate  30 mg Oral Daily  . levothyroxine  88 mcg Oral QAC breakfast  . nicotine  21 mg Transdermal Daily   Continuous Infusions: . sodium chloride 10 mL/hr (09/04/16 2000)     LOS: 3 days   Alycea Segoviano, MD FACP Hospitalist.   If 7PM-7AM, please contact night-coverage [email or web address] Password [email or web address] 09/07/2016, 9:19 AM

## 2016-09-07 NOTE — Evaluation (Signed)
Occupational Therapy Evaluation Patient Details Name: Michele Walters MRN: [id number] DOB: 04/29/1935 Today's Date: 09/07/2016    History of Present Illness  Michele Walters is a [age] y.o. 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   Clinical Impression   Pt in bed upon therapy arrival and agreeable to participate in OT evaluation. Pt does present with BUE weakness, Rt. weaker than Lt. Patient requires additional assistance for safety when completing ADL tasks for safety. Pt will benefit from skilled OT services at SNF for short term rehab to increase functional performance during ADL tasks and allow for a safe return home.     Follow Up Recommendations  SNF    Equipment Recommendations  None recommended by OT       Precautions / Restrictions Precautions Precautions: Fall Precaution Comments: Hx of falls Restrictions Weight Bearing Restrictions: No      Mobility Bed Mobility Overal bed mobility: Independent                Transfers Overall transfer level: Needs assistance Equipment used: None Transfers: Sit to/from Stand Sit to Stand: Min assist         General transfer comment: No DME used. Min Assist for safety with therapist holding gait belt        ADL either performed or assessed with clinical judgement   ADL Overall ADL's : Needs assistance/impaired Eating/Feeding: Modified independent;Sitting               Upper Body Dressing : Total assistance Upper Body Dressing Details (indicate cue type and reason): hospital gown     Toilet Transfer: Minimal assistance;Cueing for safety;Squat-pivot Toilet Transfer Details (indicate cue type and reason): No DME used         Vision Baseline Vision/History: No  visual deficits Patient Visual Report: No change from baseline              Pertinent Vitals/Pain Pain Assessment: No/denies pain     Hand Dominance Right   Extremity/Trunk Assessment Upper Extremity Assessment Upper Extremity Assessment: Generalized weakness   Lower Extremity Assessment Lower Extremity Assessment: Defer to PT evaluation       Communication Communication Communication: HOH   Cognition Arousal/Alertness: Awake/alert Behavior During Therapy: WFL for tasks assessed/performed (distractable) Overall Cognitive Status: Within Functional Limits for tasks assessed                    Home Living Family/patient expects to be discharged to:: Private residence Living Arrangements: Alone Available Help at Discharge: Family;Available PRN/intermittently (Sister lives next door) Type of Home: House       Home Layout: One level     Bathroom Shower/Tub: DietitianTub/shower unit         Home Equipment: None          Prior Functioning/Environment Level of Independence: Independent                  AM-PAC PT "6 Clicks" Daily Activity     Outcome Measure Help from another person eating meals?: None Help from another person taking care of personal grooming?: None Help from another person toileting, which includes using toliet, bedpan, or urinal?: A Little Help from another person bathing (including washing, rinsing, drying)?: A Little Help from another person to put on and  taking off regular upper body clothing?: None Help from another person to put on and taking off regular lower body clothing?: A Little 6 Click Score: 21   End of Session Equipment Utilized During Treatment: Gait belt Nurse Communication: Mobility status  Activity Tolerance: Patient tolerated treatment well Patient left: in chair;with chair alarm set;with call bell/phone within reach  OT Visit Diagnosis: Muscle weakness (generalized) (M62.81)                Time: 4098-11910915-0938 OT Time  Calculation (min): 23 min Charges:  OT General Charges $OT Visit: 1 Procedure OT Evaluation $OT Eval Low Complexity: 1 Procedure G-Codes: OT G-codes **NOT FOR INPATIENT CLASS** Functional Assessment Tool Used: AM-PAC 6 Clicks Daily Activity Functional Limitation: Self care Self Care Current Status (Y7829(G8987): At least 20 percent but less than 40 percent impaired, limited or restricted Self Care Goal Status (F6213(G8988): At least 20 percent but less than 40 percent impaired, limited or restricted Self Care Discharge Status [phone](G8989): At least 20 percent but less than 40 percent impaired, limited or restricted   Michele Walters, OTR/L,CBIS  [phone]  Michele Walters, Michele Walters 09/07/2016, 11:03 AM

## 2016-09-07 NOTE — Evaluation (Signed)
Physical Therapy Evaluation Patient Details Name: Michele Walters MRN: [id number] DOB: 10-24-1935 Today's Date: 09/07/2016   History of Present Illness  Michele Walters is a 80yo white female who comes to [facility] on 7/31 after several weeks of poor appetite, progressive weakness, poor food intake, and recent falls. Pt was admittied to [facility] 6/30 with new diagnosis of combined heart failure, with associated CP and radiating Left arm pain while flat. At baseline, pt AMB limited community distances without A/E. Pt has not been able to access the community since she returned to home on 7/4. Since that time she has also c/o Rt ear tinnitus, and Left ear roaring sounds, nearly every day. She is a limited hisotrian, but it sounds as though these episodes are most often associated with sitting to/from lying down, sometimes lasting less than 60 seconds. She also reports occasional occurence during the day without any clear association with movement or postural changes. These also are reported to be of short duration. Thepatient denies any associated nausea or visual changes with theses phenomenon, but does reports some changes to equiilibrium and gait stability.      Clinical Impression  Pt admitted with above diagnosis. Pt currently with functional limitations due to the deficits listed below (see "PT Problem List"). Upon entry, the patient is received up in chair, no family/caregiver present. The pt is awake and agreeable to participate. No acute distress noted at this time. The pt is alert and oriented x4, pleasant, conversational, and following simple and multi-step commands consistently. Functional mobility assessment demonstrates weakness in during transfers, the patient now needling heavy BUE support to perform, and heavy gait ataxia with scissoring and multiple LOB over 97ft AMB. The patient is asked if she is aware that she almost fell 5x during AMB and she was unable to say. She is asked if this is her normal gait  patten and she responds "I don't know." It is pointed out to her that her speech is slurred and labored, asked if this is her baseline speech, she responds "I don't know." Pt reports no dizziness this date, and no onset dizziness with above documents gait. Pt will benefit from skilled PT intervention to increase independence and safety with basic mobility in preparation for discharge to the venue listed below.       Follow Up Recommendations SNF    Equipment Recommendations  None recommended by PT    Recommendations for Other Services       Precautions / Restrictions Precautions Precautions: Fall Precaution Comments: Hx of falls Restrictions Weight Bearing Restrictions: No      Mobility  Bed Mobility Overal bed mobility: Independent             General bed mobility comments: received in chair   Transfers Overall transfer level: Needs assistance Equipment used: None Transfers: Sit to/from Stand Sit to Stand: Min guard         General transfer comment: heavy UE use due to trunk weakness.   Ambulation/Gait Ambulation/Gait assistance: Mod assist Ambulation Distance (Feet): 40 Feet Assistive device: None Gait Pattern/deviations: Drifts right/left;Scissoring;Ataxic;Wide base of support     General Gait Details: Pt heavily ataxic, appearing intoxicated with several instance of LOB, requring modA to prevent falling to floor. Pt has delayed awarness of these LOB, which do limit her motivation to go farther, but she does not appear very surprised or concerns with these deficits.   Stairs            Psychologist, prison and probation services  Modified Rankin (Stroke Patients Only)       Balance Overall balance assessment: History of Falls;Needs assistance         Standing balance support: During functional activity;Single extremity supported Standing balance-Leahy Scale: Poor                               Pertinent Vitals/Pain Pain Assessment: No/denies pain     Home Living Family/patient expects to be discharged to:: Private residence Living Arrangements: Alone Available Help at Discharge: Family;Available PRN/intermittently Type of Home: House Home Access: Stairs to enter Entrance Stairs-Rails: Can reach both;Left;Right Entrance Stairs-Number of Steps: 2 Home Layout: One level Home Equipment: Wheelchair - [employer] Corporationmanual;Walker - 2 wheels (doe snot use any A/E )      Prior Function Level of Independence: Independent         Comments: 6WA was amb in [employer] weekly, taking her time, but without balance impairment or A/E      Hand Dominance   Dominant Hand: Right    Extremity/Trunk Assessment   Upper Extremity Assessment Upper Extremity Assessment: Defer to OT evaluation    Lower Extremity Assessment Lower Extremity Assessment: Generalized weakness       Communication   Communication: HOH  Cognition Arousal/Alertness: Awake/alert Behavior During Therapy: WFL for tasks assessed/performed Overall Cognitive Status: Difficult to assess                                 General Comments: Pt is oriented x3, but clearly has some difficulty with memory recall. She has poor awarenss of limitations currently, decreased falls anxiety associated with limitations, and unable to relate today's performance to her baseline.       General Comments      Exercises     Assessment/Plan    PT Assessment Patient needs continued PT services  PT Problem List Decreased strength;Decreased balance;Decreased mobility;Decreased coordination;Decreased safety awareness;Decreased knowledge of precautions;Decreased knowledge of use of DME;Decreased cognition       PT Treatment Interventions Gait training;Stair training;Functional mobility training;Therapeutic activities;Therapeutic exercise;Balance training;Patient/family education;Cognitive remediation    PT Goals (Current goals can be found in the Care Plan section)  Acute Rehab PT  Goals PT Goal Formulation: Patient unable to participate in goal setting    Frequency Min 2X/week   Barriers to discharge Decreased caregiver support      Co-evaluation               AM-PAC PT "6 Clicks" Daily Activity  Outcome Measure Difficulty turning over in bed (including adjusting bedclothes, sheets and blankets)?: A Lot Difficulty moving from lying on back to sitting on the side of the bed? : Total Difficulty sitting down on and standing up from a chair with arms (e.g., wheelchair, bedside commode, etc,.)?: Total Help needed moving to and from a bed to chair (including a wheelchair)?: Total Help needed walking in hospital room?: Total Help needed climbing 3-5 steps with a railing? : Total 6 Click Score: 7    End of Session Equipment Utilized During Treatment: Gait belt Activity Tolerance: Patient limited by fatigue;Patient limited by lethargy Patient left: in chair;with call bell/phone within reach;with chair alarm set Nurse Communication: Mobility status PT Visit Diagnosis: Unsteadiness on feet (R26.81);Difficulty in walking, not elsewhere classified (R26.2);Ataxic gait (R26.0)    Time: [id number] PT Time Calculation (min) (ACUTE ONLY): 17 min   Charges:   PT  Evaluation $PT Eval Moderate Complexity: 1 Mod     PT G Codes:        1:51 PM, 09/07/16 Rosamaria LintsAllan C Aleksander Edmiston, PT, DPT Physical Therapist - [place] [phone](ASCOM)  [phone] (Office)    Korrey Schleicher C 09/07/2016, 1:51 PM

## 2016-09-08 ENCOUNTER — Inpatient Hospital Stay
Admission: RE | Admit: 2016-09-08 | Discharge: 2016-09-19 | Disposition: A | Discharge status: Nursing Home (Includes ICF) | Payer: Medicare Other | Source: Ambulatory Visit | Attending: Internal Medicine | Admitting: Internal Medicine

## 2016-09-08 DIAGNOSIS — N189 Chronic kidney disease, unspecified: Secondary | ICD-10-CM | POA: Diagnosis not present

## 2016-09-08 DIAGNOSIS — I110 Hypertensive heart disease with heart failure: Secondary | ICD-10-CM | POA: Diagnosis not present

## 2016-09-08 DIAGNOSIS — E785 Hyperlipidemia, unspecified: Secondary | ICD-10-CM | POA: Diagnosis not present

## 2016-09-08 DIAGNOSIS — R9439 Abnormal result of other cardiovascular function study: Secondary | ICD-10-CM | POA: Diagnosis not present

## 2016-09-08 DIAGNOSIS — M542 Cervicalgia: Secondary | ICD-10-CM | POA: Diagnosis not present

## 2016-09-08 DIAGNOSIS — R2689 Other abnormalities of gait and mobility: Secondary | ICD-10-CM | POA: Diagnosis not present

## 2016-09-08 DIAGNOSIS — M21371 Foot drop, right foot: Secondary | ICD-10-CM | POA: Diagnosis not present

## 2016-09-08 DIAGNOSIS — I4891 Unspecified atrial fibrillation: Secondary | ICD-10-CM | POA: Diagnosis not present

## 2016-09-08 DIAGNOSIS — E030 Congenital hypothyroidism with diffuse goiter: Secondary | ICD-10-CM | POA: Diagnosis not present

## 2016-09-08 DIAGNOSIS — E1122 Type 2 diabetes mellitus with diabetic chronic kidney disease: Secondary | ICD-10-CM | POA: Diagnosis not present

## 2016-09-08 DIAGNOSIS — Z79899 Other long term (current) drug therapy: Secondary | ICD-10-CM | POA: Diagnosis not present

## 2016-09-08 DIAGNOSIS — I130 Hypertensive heart and chronic kidney disease with heart failure and stage 1 through stage 4 chronic kidney disease, or unspecified chronic kidney disease: Secondary | ICD-10-CM | POA: Diagnosis not present

## 2016-09-08 DIAGNOSIS — R000 Tachycardia, unspecified: Secondary | ICD-10-CM | POA: Diagnosis not present

## 2016-09-08 DIAGNOSIS — G959 Disease of spinal cord, unspecified: Secondary | ICD-10-CM | POA: Diagnosis not present

## 2016-09-08 DIAGNOSIS — D520 Dietary folate deficiency anemia: Secondary | ICD-10-CM | POA: Diagnosis not present

## 2016-09-08 DIAGNOSIS — R262 Difficulty in walking, not elsewhere classified: Secondary | ICD-10-CM | POA: Diagnosis not present

## 2016-09-08 DIAGNOSIS — Z7901 Long term (current) use of anticoagulants: Secondary | ICD-10-CM | POA: Diagnosis not present

## 2016-09-08 DIAGNOSIS — E1121 Type 2 diabetes mellitus with diabetic nephropathy: Secondary | ICD-10-CM | POA: Diagnosis not present

## 2016-09-08 DIAGNOSIS — R269 Unspecified abnormalities of gait and mobility: Secondary | ICD-10-CM | POA: Diagnosis not present

## 2016-09-08 DIAGNOSIS — H8110 Benign paroxysmal vertigo, unspecified ear: Secondary | ICD-10-CM | POA: Diagnosis not present

## 2016-09-08 DIAGNOSIS — E876 Hypokalemia: Secondary | ICD-10-CM | POA: Diagnosis not present

## 2016-09-08 DIAGNOSIS — R1312 Dysphagia, oropharyngeal phase: Secondary | ICD-10-CM | POA: Diagnosis not present

## 2016-09-08 DIAGNOSIS — M5134 Other intervertebral disc degeneration, thoracic region: Secondary | ICD-10-CM | POA: Diagnosis not present

## 2016-09-08 DIAGNOSIS — I5033 Acute on chronic diastolic (congestive) heart failure: Secondary | ICD-10-CM | POA: Diagnosis not present

## 2016-09-08 DIAGNOSIS — E1140 Type 2 diabetes mellitus with diabetic neuropathy, unspecified: Secondary | ICD-10-CM | POA: Diagnosis not present

## 2016-09-08 DIAGNOSIS — R29810 Facial weakness: Secondary | ICD-10-CM | POA: Diagnosis not present

## 2016-09-08 DIAGNOSIS — R279 Unspecified lack of coordination: Secondary | ICD-10-CM | POA: Diagnosis not present

## 2016-09-08 DIAGNOSIS — M2578 Osteophyte, vertebrae: Secondary | ICD-10-CM | POA: Diagnosis not present

## 2016-09-08 DIAGNOSIS — I10 Essential (primary) hypertension: Secondary | ICD-10-CM | POA: Diagnosis not present

## 2016-09-08 DIAGNOSIS — I480 Paroxysmal atrial fibrillation: Secondary | ICD-10-CM | POA: Diagnosis not present

## 2016-09-08 DIAGNOSIS — R9082 White matter disease, unspecified: Secondary | ICD-10-CM | POA: Diagnosis not present

## 2016-09-08 DIAGNOSIS — I6782 Cerebral ischemia: Secondary | ICD-10-CM | POA: Diagnosis not present

## 2016-09-08 DIAGNOSIS — I6980 Unspecified sequelae of other cerebrovascular disease: Secondary | ICD-10-CM | POA: Diagnosis not present

## 2016-09-08 DIAGNOSIS — I5042 Chronic combined systolic (congestive) and diastolic (congestive) heart failure: Secondary | ICD-10-CM | POA: Diagnosis not present

## 2016-09-08 DIAGNOSIS — Z87891 Personal history of nicotine dependence: Secondary | ICD-10-CM | POA: Diagnosis not present

## 2016-09-08 DIAGNOSIS — R17 Unspecified jaundice: Principal | ICD-10-CM

## 2016-09-08 DIAGNOSIS — I714 Abdominal aortic aneurysm, without rupture: Secondary | ICD-10-CM | POA: Diagnosis not present

## 2016-09-08 DIAGNOSIS — D649 Anemia, unspecified: Secondary | ICD-10-CM | POA: Diagnosis not present

## 2016-09-08 DIAGNOSIS — G319 Degenerative disease of nervous system, unspecified: Secondary | ICD-10-CM | POA: Diagnosis not present

## 2016-09-08 DIAGNOSIS — E039 Hypothyroidism, unspecified: Secondary | ICD-10-CM | POA: Diagnosis not present

## 2016-09-08 DIAGNOSIS — M6281 Muscle weakness (generalized): Secondary | ICD-10-CM | POA: Diagnosis not present

## 2016-09-08 DIAGNOSIS — M4802 Spinal stenosis, cervical region: Secondary | ICD-10-CM | POA: Diagnosis not present

## 2016-09-08 DIAGNOSIS — N179 Acute kidney failure, unspecified: Secondary | ICD-10-CM | POA: Diagnosis not present

## 2016-09-08 DIAGNOSIS — N183 Chronic kidney disease, stage 3 (moderate): Secondary | ICD-10-CM | POA: Diagnosis not present

## 2016-09-08 DIAGNOSIS — Z9181 History of falling: Secondary | ICD-10-CM | POA: Diagnosis not present

## 2016-09-08 DIAGNOSIS — M549 Dorsalgia, unspecified: Secondary | ICD-10-CM | POA: Diagnosis not present

## 2016-09-08 LAB — GLUCOSE, CAPILLARY
GLUCOSE-CAPILLARY: 211 mg/dL — AB (ref 65–99)
Glucose-Capillary: 133 mg/dL — ABNORMAL HIGH (ref 65–99)

## 2016-09-08 MED ORDER — BISACODYL 5 MG PO TBEC: 10.0000 mg | DELAYED_RELEASE_TABLET | Freq: Every day | ORAL | 0 refills | Status: AC | PRN

## 2016-09-08 NOTE — Clinical Social Work Note (Signed)
Clinical Social Worker facilitated patient discharge including contacting patient family and facility [facility] to confirm patient discharge plans. Clinical information faxed to facility and family agreeable with plan. RN to call report to [phone] prior to discharge.  Clinical Social Worker will sign off for now as social work intervention is no longer needed. Please consult us again if new need arises.  Mitzi Davenport.Niko Jakel B. Gean QuintBrown,MSW, LCSWA Clinical Social Work Dept Weekend Social Worker [phone] 11:43 AM

## 2016-09-08 NOTE — Progress Notes (Signed)
Patient IV removed, tolerated well. [facility] given to Nurse Zella Ballobin. Patient eating lunch, patient to be transported after lunch to [facility].

## 2016-09-08 NOTE — Clinical Social Work Placement (Signed)
   CLINICAL SOCIAL WORK PLACEMENT  NOTE  Date:  09/08/2016  Patient Details  Name: Michele Walters MRN: [id number] Date of Birth: 07/18/1935  Clinical Social Work is seeking post-discharge placement for this patient at the Skilled  Nursing Facility level of care (*CSW will initial, date and re-position this form in  chart as items are completed):  Yes   Patient/family provided with [facility] Clinical Social Work Department's list of facilities offering this level of care within the geographic area requested by the patient (or if unable, by the patient's family).  Yes   Patient/family informed of their freedom to choose among providers that offer the needed level of care, that participate in Medicare, Medicaid or managed care program needed by the patient, have an available bed and are willing to accept the patient.  Yes   Patient/family informed of [name] ownership interest [facility], as well as of the fact that they are under no obligation to receive care at these facilities.  PASRR submitted to EDS on 09/05/16     PASRR number received on 09/05/16     Existing PASRR number confirmed on       FL2 transmitted to all facilities in geographic area requested by pt/family on 09/05/16     FL2 transmitted to all facilities within larger geographic area on       Patient informed that his/her managed care company has contracts with or will negotiate with certain facilities, including the following:            Patient/family informed of bed offers received.  Patient [facility] bed at  [facility]     Physician recommends and patient chooses bed at      Patient to be transferred to  [facility] on 09/08/16.  Patient to be transferred to facility by  (Nurse to take pt over)     Patient family notified on 09/08/16 of transfer.  Name of family member notified:  Family     PHYSICIAN Please sign FL2     Additional Comment:     _______________________________________________ Norlene DuelBROWN, Frances Joynt B, LCSWA 09/08/2016, 11:45 AM

## 2016-09-08 NOTE — Discharge Summary (Signed)
Physician Discharge Summary  Michele Walters OMB:[id number] DOB: 06-17-1935 DOA: 09/04/2016  PCP: Chevis Pretty, FNP  Admit date: 09/04/2016 Discharge date: 09/08/2016  Admitted From: Home.  Disposition:  To SNF.   Recommendations for Outpatient Follow-up:  1. Follow up with PCP in 1-2 weeks 2. Please obtain BMP/CBC in one week  Home Health: None.  Equipment/Devices: none.  Discharge Condition: no DOE.  Hb of 10 g/dL and stable.  Cr improved to 2.8 from 3.2, but still elevated. CODE STATUS: FULL CODE.  Diet recommendation: as tolerated.   Brief/Interim Summary:  Patient was admitted by Dr Lorin Mercy on September 04, 2016 for symptomatic anemia and AKI.  As per her H and P: "Michele Walters is a 81 y.o. female with medical history significant of hypertension, DM type II, dyslipidemia, hypothyroidism, tobacco dependence (stopped 08/02/16), CKD for baseline creatinine close to 1.5, and recent (6/30-7/4) hospitalization at [facility] for newly diagnosed combined heart failure (EF 35-40%) and ARF on CKD.  She has been home since 7/4.  She was doing fairly well initially but has had minimal appetite since getting home.  As a result, she has been getting weaker and weaker every day.  She reports that food just doesn't taste good and so she does not want to eat.  They think she has lost about 6 pounds.  She fell yesterday PM and the rescue squad had to come get her up.  She has difficult bearing weight on her legs due to weakness.  She took Meclizine in the past for vertigo and her head felt a little swimmy yesterday but she was out of Meclizine.  Her ears have been ringing and it feels like the wind blowing.  Her family is concerned that she is no longer safe at home.  No n/v, no blood in stools.  No pain.  No chest pain or SOB.  Pees 2-3 times daily, unchanged. No Keilani Terrance edema.   ED Course: New symptomatic anemia, Hgb 6.5 - transfuse 2 units PRBC, Lasix between units.  Acute renal failure on CKD - ?prerenal.   Profoundly unsteady and weak on exam.  Hospital course:  Patient was admitted, and was given IVF.  She improved her AKI to a Cr of 2.5, which I suspect was her baseline.  Her Cr was 2.8 on August 08, 2016.  For her anemia, though her OB stool was negative,  It is still possible that she has chronic GI bleed.  Anemia panel showed normal B12, folate, and Ferritin.  Iron sat was 32 percent.  She was given 2 units of PRBC, bringing her Hb to 10 grams per dL, and remained stable.   She felt better.   I still strongly suggest that she undergo at least an upper and lower GI outpatient as soon as possible, given her weight loss and screening colonoscopy was overdue.  PT evaluated her, and with her feeling weak, she is agreeable to go to SNF, and was accepted.  She is stable for discharge today.   Discharge Diagnoses:  Principal Problem:   Symptomatic anemia Active Problems:   HTN (hypertension)   Hypothyroidism   Hyperlipidemia   DM2 (diabetes mellitus, type 2) (HCC)   Kidney disease, chronic, stage III (moderate, EGFR 30-59 ml/min)   Elevated troponin   Muscular deconditioning   Chronic combined systolic (congestive) and diastolic (congestive) heart failure [facility])    Discharge Instructions  Discharge Instructions    Diet - low sodium heart healthy    Complete by:  As directed    Discharge instructions    Complete by:  As directed    You should follow up with your PCP next week, and have your blood count checked again.  You should at least have an upper and lower endoscopies done as soon as possible to exclude potential cancer of the gastrointestinal tract.   Increase activity slowly    Complete by:  As directed      Allergies as of 09/08/2016   No Known Allergies     Medication List    STOP taking these medications   furosemide 20 MG tablet Commonly known as:  LASIX   meclizine 25 MG tablet Commonly known as:  ANTIVERT   ONETOUCH DELICA LANCETS 82N Misc   ONETOUCH VERIO test  strip Generic drug:  glucose blood   polyethylene glycol packet Commonly known as:  MIRALAX / GLYCOLAX     TAKE these medications   aspirin 81 MG tablet Take 81 mg by mouth daily.   atorvastatin 80 MG tablet Commonly known as:  LIPITOR Take 1 tablet (80 mg total) by mouth daily at 6 PM.   bisacodyl 5 MG EC tablet Commonly known as:  DULCOLAX Take 2 tablets (10 mg total) by mouth daily as needed for mild constipation or moderate constipation.   calcium carbonate 600 MG Tabs tablet Commonly known as:  OS-CAL Take 600 mg by mouth every other day.   carvedilol 6.25 MG tablet Commonly known as:  COREG Take 1 tablet (6.25 mg total) by mouth 2 (two) times daily with a meal.   docusate sodium 100 MG capsule Commonly known as:  COLACE Take 2 capsules (200 mg total) by mouth 2 (two) times daily. What changed:  when to take this  reasons to take this   Fish Oil 1000 MG Caps Take 1 capsule by mouth every other day.   hydrALAZINE 50 MG tablet Commonly known as:  APRESOLINE Take 1 tablet (50 mg total) by mouth every 8 (eight) hours.   isosorbide mononitrate 30 MG 24 hr tablet Commonly known as:  IMDUR Take 1 tablet (30 mg total) by mouth daily.   levothyroxine 88 MCG tablet Commonly known as:  SYNTHROID, LEVOTHROID Take 1 tablet (88 mcg total) by mouth daily before breakfast.   nicotine 21 mg/24hr patch Commonly known as:  NICODERM CQ - dosed in mg/24 hours Place 1 patch (21 mg total) onto the skin daily.      Contact information for after-discharge care    [facility] .   Specialty:  Skilled Nursing Facility Contact information: [address] [phone]             No Known Allergies  Consultations:  None.    Procedures/Studies: Dg Chest 2 View  Result Date: 09/04/2016 CLINICAL DATA:  Fall EXAM: CHEST  2 VIEW COMPARISON:  08/04/2016 FINDINGS: Chronic lung disease with  hyperinflation and prominent lung markings bilaterally. No focal consolidation. Negative for heart failure or effusion. No acute fracture is identified. IMPRESSION: Chronic lung disease. No acute abnormality. Improved aeration since the prior study. Electronically Signed   By: Franchot Gallo M.D.   On: 09/04/2016 16:02   Ct Head Wo Contrast  Result Date: 09/04/2016 CLINICAL DATA:  Generalized weakness. Vertigo and dizziness. 2 falls in the past month. Weight loss. EXAM: CT HEAD WITHOUT CONTRAST TECHNIQUE: Contiguous axial images were obtained from the base of the skull through the vertex without intravenous  contrast. COMPARISON:  None. FINDINGS: Brain: Diffusely enlarged ventricles and subarachnoid spaces. Patchy white matter low density in both cerebral hemispheres. No intracranial hemorrhage, mass lesion or CT evidence of acute infarction. Vascular: No hyperdense vessel or unexpected calcification. Skull: Mild bilateral hyperostosis frontalis. Sinuses/Orbits: Bilateral lens implants. Unremarkable included paranasal sinuses. Other:  None. IMPRESSION: 1. No acute abnormality. 2. Mild to moderate diffuse cerebral and cerebellar atrophy. 3. Moderate chronic small vessel white matter ischemic changes in both cerebral hemispheres. Electronically Signed   By: Claudie Revering M.D.   On: 09/04/2016 16:21    Subjective:  I feel great.   Discharge Exam: Vitals:   09/07/16 2111 09/08/16 0513  BP: (!) 116/58 125/75  Pulse: 72 71  Resp:  20  Temp: 98.4 F (36.9 C) 98.2 F (36.8 C)   Vitals:   09/07/16 0450 09/07/16 1420 09/07/16 2111 09/08/16 0513  BP: 132/61 (!) 112/58 (!) 116/58 125/75  Pulse: 67 63 72 71  Resp:  20  20  Temp: 98 F (36.7 C) 97.6 F (36.4 C) 98.4 F (36.9 C) 98.2 F (36.8 C)  TempSrc: Oral Oral Oral Oral  SpO2: 93% 98% 91% 92%  Weight:      Height:        General: Pt is alert, awake, not in acute distress Cardiovascular: RRR, S1/S2 +, no rubs, no gallops Respiratory: CTA  bilaterally, no wheezing, no rhonchi Abdominal: Soft, NT, ND, bowel sounds + Extremities: no edema, no cyanosis    The results of significant diagnostics from this hospitalization (including imaging, microbiology, ancillary and laboratory) are listed below for reference.     Labs: BNP (last 3 results)  Recent Labs  08/04/16 1253  BNP 8,338.2*   Basic Metabolic Panel:  Recent Labs Lab 09/04/16 1458 09/05/16 0555  NA 131* 133*  K 4.3 3.5  CL 96* 97*  CO2 22 24  GLUCOSE 369* 177*  BUN 88* 84*  CREATININE 3.24* 2.88*  CALCIUM 8.8* 8.4*   Liver Function Tests:  Recent Labs Lab 09/04/16 1846  AST 57*  ALT 42  ALKPHOS 106  BILITOT 2.7*  PROT 6.0*  ALBUMIN 2.5*   CBC:  Recent Labs Lab 09/04/16 1458 09/05/16 0555 09/06/16 0857  WBC 13.3* 11.6* 9.7  NEUTROABS 10.9*  --   --   HGB 6.5* 6.0* 10.4*  HCT 19.0* 17.8* 29.6*  MCV 92.7 93.2 90.5  PLT 165 161 124*   Cardiac Enzymes:  Recent Labs Lab 09/04/16 1504 09/04/16 1846 09/05/16 0005 09/05/16 0556 09/05/16 0813  TROPONINI 0.07* 0.06* 0.08* 0.07* 0.08*   BNP: Invalid input(s): POCBNP CBG:  Recent Labs Lab 09/07/16 0815 09/07/16 1117 09/07/16 1704 09/07/16 2111 09/08/16 0759  GLUCAP 146* 271* 189* 123* 133*   Urinalysis    Component Value Date/Time   COLORURINE YELLOW 09/04/2016 1800   APPEARANCEUR CLEAR 09/04/2016 1800   LABSPEC 1.012 09/04/2016 1800   PHURINE 5.0 09/04/2016 1800   GLUCOSEU 50 (A) 09/04/2016 1800   HGBUR NEGATIVE 09/04/2016 1800   BILIRUBINUR NEGATIVE 09/04/2016 1800   KETONESUR NEGATIVE 09/04/2016 1800   PROTEINUR NEGATIVE 09/04/2016 1800   NITRITE NEGATIVE 09/04/2016 1800   LEUKOCYTESUR SMALL (A) 09/04/2016 1800    Time coordinating discharge: Over 30 minutes SIGNED:  Orvan Falconer, MD FACP [facility] 09/08/2016, 11:24 AM   If 7PM-7AM, please contact night-coverage [email or web address] Password [email or web address]

## 2016-09-10 ENCOUNTER — Encounter (HOSPITAL_COMMUNITY)
Admission: RE | Admit: 2016-09-10 | Discharge: 2016-09-10 | Disposition: A | Discharge status: Home / Self Care | Payer: Medicare Other | Source: Other Acute Inpatient Hospital | Attending: Internal Medicine | Admitting: Internal Medicine

## 2016-09-10 ENCOUNTER — Encounter: Payer: Self-pay | Admitting: Internal Medicine

## 2016-09-10 ENCOUNTER — Non-Acute Institutional Stay (SKILLED_NURSING_FACILITY): Payer: Medicare Other | Admitting: Internal Medicine

## 2016-09-10 DIAGNOSIS — N183 Chronic kidney disease, stage 3 unspecified: Secondary | ICD-10-CM

## 2016-09-10 DIAGNOSIS — I480 Paroxysmal atrial fibrillation: Secondary | ICD-10-CM | POA: Insufficient documentation

## 2016-09-10 DIAGNOSIS — I10 Essential (primary) hypertension: Secondary | ICD-10-CM | POA: Insufficient documentation

## 2016-09-10 DIAGNOSIS — D649 Anemia, unspecified: Secondary | ICD-10-CM

## 2016-09-10 DIAGNOSIS — H8110 Benign paroxysmal vertigo, unspecified ear: Secondary | ICD-10-CM | POA: Diagnosis not present

## 2016-09-10 DIAGNOSIS — R000 Tachycardia, unspecified: Secondary | ICD-10-CM

## 2016-09-10 DIAGNOSIS — E876 Hypokalemia: Secondary | ICD-10-CM

## 2016-09-10 LAB — CBC WITH DIFFERENTIAL/PLATELET
BASOS ABS: 0 10*3/uL (ref 0.0–0.1)
Basophils Relative: 0 %
EOS ABS: 0.3 10*3/uL (ref 0.0–0.7)
Eosinophils Relative: 4 %
HCT: 30 % — ABNORMAL LOW (ref 36.0–46.0)
Hemoglobin: 10.2 g/dL — ABNORMAL LOW (ref 12.0–15.0)
Lymphocytes Relative: 10 %
Lymphs Abs: 0.7 10*3/uL (ref 0.7–4.0)
MCH: 31.2 pg (ref 26.0–34.0)
MCHC: 34 g/dL (ref 30.0–36.0)
MCV: 91.7 fL (ref 78.0–100.0)
MONO ABS: 0.7 10*3/uL (ref 0.1–1.0)
Monocytes Relative: 10 %
NEUTROS PCT: 76 %
Neutro Abs: 5.1 10*3/uL (ref 1.7–7.7)
PLATELETS: 95 10*3/uL — AB (ref 150–400)
RBC: 3.27 MIL/uL — AB (ref 3.87–5.11)
RDW: 21.5 % — ABNORMAL HIGH (ref 11.5–15.5)
WBC: 6.8 10*3/uL (ref 4.0–10.5)

## 2016-09-10 LAB — BASIC METABOLIC PANEL
Anion gap: 11 (ref 5–15)
BUN: 43 mg/dL — ABNORMAL HIGH (ref 6–20)
CO2: 24 mmol/L (ref 22–32)
Calcium: 7.7 mg/dL — ABNORMAL LOW (ref 8.9–10.3)
Chloride: 97 mmol/L — ABNORMAL LOW (ref 101–111)
Creatinine, Ser: 1.44 mg/dL — ABNORMAL HIGH (ref 0.44–1.00)
GFR calc Af Amer: 39 mL/min — ABNORMAL LOW (ref >60)
GFR calc non Af Amer: 34 mL/min — ABNORMAL LOW (ref >60)
Glucose, Bld: 123 mg/dL — ABNORMAL HIGH (ref 65–99)
Potassium: 2.6 mmol/L — CL (ref 3.5–5.1)
Sodium: 132 mmol/L — ABNORMAL LOW (ref 135–145)

## 2016-09-10 NOTE — Progress Notes (Signed)
Provider: Marga Melnick MD  Location:   [facility] Room Number: 132/P Place of Service:  SNF (31)  PCP: Bennie Pierini, FNP Patient Care Team: Bennie Pierini, FNP as PCP - General (Nurse Practitioner) Derryl Harbor, OD as Consulting Physician [facility])  Extended Emergency Contact Information Primary Emergency Contact: Gibson,Reba Address: [address] [place], [place] [address] [place] of [place] Home Phone: [phone] Work Phone: [phone] Relation: Sister  Code Status: Full Code Goals of Care: Advanced Directive information Advanced Directives 09/10/2016  Does Patient Have a Medical Advance Directive? Yes  Type of Advance Directive (No Data)  Does patient want to make changes to medical advance directive? No - Patient declined  Copy of Healthcare Power of Attorney in Chart? -  Would patient like information on creating a medical advance directive? No - Patient declined   Chief Complaint  Patient presents with  . New Admit To SNF   HPI: Patient is a 81 y.o. female seen today for admission to [facility] SNF following hospitalization 7/31-09/08/16 for symptomatic anemia and acute kidney injury The patient had been recently hospitalized at [facility] with combined heart failure with ejection fraction 35-40 percent, BNP > 2000 in context of acute renal failure on CKD. Dr Leodis Sias , Cardiologist consulted. With Lasix she diuresed 2.6 L.  Initially at discharge from [facility] 7/4 the patient was stable but had anorexia and progressive weakness. This was associated with a 6 pound weight loss.  The day prior to the readmission 7/31 she had fallen.EMS documented she was unable to bear weight due to leg weakness. She denies cardiac or neurologic prodrome prior to the fall. She states that she has had "vertigo" intermittently for the last 10 years, 2 episodes in last 6 months. She has taken meclizine as needed.There is no frank vertigo but she  feels swimmy headed when she closes her eyes.  Triggers appear to be change in position such as turning her head or as  standing up (with this episode standing up from the commode).  She also describes progressive hearing loss & tinnitus. To date she's had no ENT evaluation.  CT scan 7/31 revealed no acute abnormality.  mild-moderate diffuse cerebral and cerebellar atrophy. Moderate chronic small vessel white matter ischemic changes in both cerebral hemispheres.  She received IV fluids with improvement of her creatinine. Anemia panel revealed a normal B12, folate, and ferritin. Iron sat was 32%. She did receive 2 units of red cells with rise in hemoglobin to 10. Upper and lower endoscopies were recommended once she was stable to evaluate the weight loss and anemia. OB had been negative in the hospital. She has a diagnosis of diabetes. The highest A1c on record was 6.0 on 03/08/15. Her A1c was nondiabetic at 4.8% on 08/04/16 She was discharged to the SNF for PT/OT. Labs today reveal profound hypokalemia with a value 2.6. Creatinine has improved to 1.44,GFR is 34. Chronic, normocytic anemia  remains essentially stable with hemoglobin 10.2/hematocrit 30 She had a colonoscopy remotely by Dr.Orr. There is no family history of colon cancer.  Past Medical History:  Diagnosis Date  . Allergy   . Cataract   . Diabetes mellitus without complication (HCC)   . Hyperlipidemia   . Hypertension   . Thyroid disease    Past Surgical History:  Procedure Laterality Date  . ABDOMINAL HYSTERECTOMY     partial but later ovaries removed  . EYE SURGERY Bilateral  cataract  . OOPHORECTOMY Bilateral     reports that she quit smoking about 5 weeks ago. Her smoking use included Cigarettes. She has a 55.00 pack-year smoking history. She has never used smokeless tobacco. She reports that she does not drink alcohol or use drugs. Social History   Social History  . Marital status: Single    Spouse name: N/A  .  Number of children: N/A  . Years of education: N/A   Occupational History  . Retired    Social History Main Topics  . Smoking status: Former Smoker    Packs/day: 1.00    Years: 55.00    Types: Cigarettes    Quit date: 08/03/2016  . Smokeless tobacco: Never Used  . Alcohol use No  . Drug use: No  . Sexual activity: No   Other Topics Concern  . Not on file   Social History Narrative  . No narrative on file    Functional Status Survey:    Family History  Problem Relation Age of Onset  . Heart disease Mother   . Heart attack Mother        massive  . COPD Father   . Emphysema Father   . Down syndrome Brother     Health Maintenance  Topic Date Due  . FOOT EXAM  10/11/2016 (Originally 07/10/2016)  . OPHTHALMOLOGY EXAM  10/11/2016 (Originally 06/22/2016)  . DEXA SCAN  10/11/2016 (Originally 07/15/2016)  . TETANUS/TDAP  10/11/2016 (Originally 02/06/2012)  . INFLUENZA VACCINE  01/05/2017 (Originally 09/05/2016)  . URINE MICROALBUMIN  10/25/2016  . HEMOGLOBIN A1C  02/03/2017  . PNA vac Low Risk Adult  Completed    No Known Allergies  Allergies as of 09/10/2016   No Known Allergies     Medication List       Accurate as of 09/10/16  9:12 AM. Always use your most recent med list.          aspirin 81 MG tablet Take 81 mg by mouth daily.   atorvastatin 80 MG tablet Commonly known as:  LIPITOR Take 1 tablet (80 mg total) by mouth daily at 6 PM.   bisacodyl 5 MG EC tablet Commonly known as:  DULCOLAX Take 2 tablets (10 mg total) by mouth daily as needed for mild constipation or moderate constipation.   calcium carbonate 600 MG Tabs tablet Commonly known as:  OS-CAL Take 600 mg by mouth every other day.   carvedilol 6.25 MG tablet Commonly known as:  COREG Take 1 tablet (6.25 mg total) by mouth 2 (two) times daily with a meal.   docusate sodium 100 MG capsule Commonly known as:  COLACE Take 2 capsules (200 mg total) by mouth 2 (two) times daily.   Fish Oil 1000  MG Caps Take 1 capsule by mouth every other day.   hydrALAZINE 50 MG tablet Commonly known as:  APRESOLINE Take 1 tablet (50 mg total) by mouth every 8 (eight) hours.   isosorbide mononitrate 30 MG 24 hr tablet Commonly known as:  IMDUR Take 1 tablet (30 mg total) by mouth daily.   levothyroxine 88 MCG tablet Commonly known as:  SYNTHROID, LEVOTHROID Take 1 tablet (88 mcg total) by mouth daily before breakfast.   nicotine 21 mg/24hr patch Commonly known as:  NICODERM CQ - dosed in mg/24 hours Place 1 patch (21 mg total) onto the skin daily.       Review of Systems  Review of systems: She describes sensation of the "wind blowing" in her ears  with ringing accompanying the progressive hearing loss. She describes constipation @ this time with no bowel movement for one week. Since admission to the SNF she describes occasional dysphagia.  Constitutional: No fever,significant weight change, fatigue  Eyes: No redness, discharge, pain, vision change ENT/mouth: No nasal congestion,  purulent discharge, earache ,sore throat  Cardiovascular: No chest pain, palpitations,paroxysmal nocturnal dyspnea, claudication, edema  Respiratory: No cough, sputum production,hemoptysis, DOE , significant snoring,apnea  Gastrointestinal: No heartburn,abdominal pain, nausea / vomiting,rectal bleeding, melena Genitourinary: No dysuria,hematuria, pyuria,  incontinence, nocturia Musculoskeletal: No joint stiffness, joint swelling, weakness,pain Dermatologic: No rash, pruritus, change in appearance of skin Neurologic: No headache,syncope, seizures, numbness , tingling Psychiatric: No significant anxiety , depression, insomnia, anorexia Endocrine: No change in hair/skin/ nails, excessive thirst, excessive hunger, excessive urination  Hematologic/lymphatic: No significant bruising, lymphadenopathy,abnormal bleeding Allergy/immunology: No itchy/ watery eyes, significant sneezing, urticaria, angioedema  Vitals:    09/10/16 0912  BP: 104/61  Pulse: (!) 121  Resp: 20  Temp: 98 F (36.7 C)  TempSrc: Oral   There is no height or weight on file to calculate BMI. Physical Exam:  Physical exam:  Pertinent or positive findings: She is thin but appears adequately nourished. She also has thinning of the eyebrows laterally. She is markedly hard of hearing. She has upper plate and lower partial. The heart rhythm is rapid and irregular suggesting atrial fibrillation. Breath sounds are decreased. Extremities are thin. Pedal pulses are decreased. There is localized tanning, mainly of the forearms. She has isolated bruising.   General appearance: no acute distress , increased work of breathing is present.   Lymphatic: No lymphadenopathy about the head, neck, axilla . Eyes: No conjunctival inflammation or lid edema is present. There is no scleral icterus. Ears:  External ear exam shows no significant lesions or deformities.   Nose:  External nasal examination shows no deformity or inflammation. Nasal mucosa are pink and moist without lesions ,exudates Oral exam: lips and gums are healthy appearing.There is no oropharyngeal erythema or exudate . Neck:  No thyromegaly, masses, tenderness noted.    Lungs: without wheezes, rhonchi,rales , rubs. Abdomen:Bowel sounds are normal. Abdomen is soft and nontender with no organomegaly, hernias,masses. GU: deferred  Extremities:  No cyanosis, clubbing,edema  Neurologic exam : Strength equal  in upper & lower extremities Balance,Rhomberg,finger to nose testing could not be completed due to clinical state Deep tendon reflexes are equal Skin: Warm & dry w/o tenting. No significant lesions or rash.  Labs reviewed: Basic Metabolic Panel:  Recent Labs  40/98/11 0621  08/08/16 0850 09/04/16 1458 09/05/16 0555  NA  --   < > 133* 131* 133*  K  --   < > 4.3 4.3 3.5  CL  --   < > 95* 96* 97*  CO2  --   < > 24 22 24   GLUCOSE  --   < > 166* 369* 177*  BUN  --   < > 66*  88* 84*  CREATININE  --   < > 2.81* 3.24* 2.88*  CALCIUM  --   < > 9.5 8.8* 8.4*  MG 1.7  --   --   --   --   < > = values in this interval not displayed. Liver Function Tests:  Recent Labs  11/22/15 1128 04/23/16 0912 09/04/16 1846  AST 14 18 57*  ALT 5 8 42  ALKPHOS 84 76 106  BILITOT 0.5 0.6 2.7*  PROT 7.8 6.9 6.0*  ALBUMIN 4.3 4.0 2.5*  No results for input(s): LIPASE, AMYLASE in the last 8760 hours. No results for input(s): AMMONIA in the last 8760 hours. CBC:  Recent Labs  09/04/16 1458 09/05/16 0555 09/06/16 0857  WBC 13.3* 11.6* 9.7  NEUTROABS 10.9*  --   --   HGB 6.5* 6.0* 10.4*  HCT 19.0* 17.8* 29.6*  MCV 92.7 93.2 90.5  PLT 165 161 124*   Cardiac Enzymes:  Recent Labs  09/05/16 0005 09/05/16 0556 09/05/16 0813  TROPONINI 0.08* 0.07* 0.08*   BNP: Invalid input(s): POCBNP Lab Results  Component Value Date   HGBA1C 4.8 08/04/2016   Lab Results  Component Value Date   TSH 1.986 08/04/2016   Lab Results  Component Value Date   VITAMINB12 735 09/04/2016   Lab Results  Component Value Date   FOLATE 12.6 09/04/2016   Lab Results  Component Value Date   IRON 88 09/04/2016   TIBC 277 09/04/2016   FERRITIN 310 (H) 09/04/2016    Imaging and Procedures obtained prior to SNF admission: Dg Chest 2 View  Result Date: 09/04/2016 CLINICAL DATA:  Fall EXAM: CHEST  2 VIEW COMPARISON:  08/04/2016 FINDINGS: Chronic lung disease with hyperinflation and prominent lung markings bilaterally. No focal consolidation. Negative for heart failure or effusion. No acute fracture is identified. IMPRESSION: Chronic lung disease. No acute abnormality. Improved aeration since the prior study. Electronically Signed   By: Marlan Palauharles  Clark M.D.   On: 09/04/2016 16:02   Ct Head Wo Contrast  Result Date: 09/04/2016 CLINICAL DATA:  Generalized weakness. Vertigo and dizziness. 2 falls in the past month. Weight loss. EXAM: CT HEAD WITHOUT CONTRAST TECHNIQUE: Contiguous axial  images were obtained from the base of the skull through the vertex without intravenous contrast. COMPARISON:  None. FINDINGS: Brain: Diffusely enlarged ventricles and subarachnoid spaces. Patchy white matter low density in both cerebral hemispheres. No intracranial hemorrhage, mass lesion or CT evidence of acute infarction. Vascular: No hyperdense vessel or unexpected calcification. Skull: Mild bilateral hyperostosis frontalis. Sinuses/Orbits: Bilateral lens implants. Unremarkable included paranasal sinuses. Other:  None. IMPRESSION: 1. No acute abnormality. 2. Mild to moderate diffuse cerebral and cerebellar atrophy. 3. Moderate chronic small vessel white matter ischemic changes in both cerebral hemispheres. Electronically Signed   By: Beckie SaltsSteven  Reid M.D.   On: 09/04/2016 16:21    Assessment/Plan: See clinical summary under each active problem in the Problem List with associated updated therapeutic plan  Labs/tests ordered:see extensive orders

## 2016-09-10 NOTE — Assessment & Plan Note (Addendum)
09/10/16 clinically atrial fibrillation, new onset is suggested 09/04/16 EKG : Sinus rhythm Thyroid panel normal 08/04/16 09/10/16 potassium 2.6, no medications identified which should cause hypokalemia. Constipation described. She denies any diarrhea or vomiting. 09/10/16 EKG: Atrial fibrillation with rapid ventricular response with rate of 112-126, CHA2DS2-VASc score of 7. Patient asymptomatic. Eliquis 2.5 mg by mouth twice a day, dose based on age, creatinine, and body weight. Increase Coreg for rate control

## 2016-09-10 NOTE — Assessment & Plan Note (Signed)
A/6/18 creatinine 1.44 and GFR 34, markedly improved

## 2016-09-10 NOTE — Assessment & Plan Note (Signed)
Anemia stable at this time with hemoglobin 10.2 and hematocrit 30 Negative FOB and normal B12, ferritin, and folate Colonoscopy overdue (prior endo by Dr Harold Barbanrr,GI); repeat  should be considered once patient is clinically stable

## 2016-09-10 NOTE — Patient Instructions (Signed)
See assessment and plan under each diagnosis in the problem list and acutely for this visit 

## 2016-09-10 NOTE — Assessment & Plan Note (Signed)
ENT evaluation indicated

## 2016-09-11 ENCOUNTER — Non-Acute Institutional Stay (SKILLED_NURSING_FACILITY): Payer: Medicare Other | Admitting: Internal Medicine

## 2016-09-11 ENCOUNTER — Other Ambulatory Visit (HOSPITAL_COMMUNITY)
Admission: RE | Admit: 2016-09-11 | Discharge: 2016-09-11 | Disposition: A | Discharge status: Home / Self Care | Payer: Medicare Other | Source: Skilled Nursing Facility | Attending: Internal Medicine | Admitting: Internal Medicine

## 2016-09-11 ENCOUNTER — Encounter: Payer: Self-pay | Admitting: Internal Medicine

## 2016-09-11 DIAGNOSIS — I5033 Acute on chronic diastolic (congestive) heart failure: Secondary | ICD-10-CM

## 2016-09-11 DIAGNOSIS — D649 Anemia, unspecified: Secondary | ICD-10-CM

## 2016-09-11 DIAGNOSIS — N183 Chronic kidney disease, stage 3 unspecified: Secondary | ICD-10-CM

## 2016-09-11 DIAGNOSIS — E1100 Type 2 diabetes mellitus with hyperosmolarity without nonketotic hyperglycemic-hyperosmolar coma (NKHHC): Secondary | ICD-10-CM | POA: Diagnosis not present

## 2016-09-11 DIAGNOSIS — E876 Hypokalemia: Secondary | ICD-10-CM | POA: Diagnosis not present

## 2016-09-11 DIAGNOSIS — I10 Essential (primary) hypertension: Secondary | ICD-10-CM | POA: Insufficient documentation

## 2016-09-11 DIAGNOSIS — I4891 Unspecified atrial fibrillation: Secondary | ICD-10-CM

## 2016-09-11 LAB — BASIC METABOLIC PANEL
Anion gap: 10 (ref 5–15)
BUN: 45 mg/dL — ABNORMAL HIGH (ref 6–20)
CALCIUM: 8 mg/dL — AB (ref 8.9–10.3)
CO2: 23 mmol/L (ref 22–32)
CREATININE: 1.49 mg/dL — AB (ref 0.44–1.00)
Chloride: 99 mmol/L — ABNORMAL LOW (ref 101–111)
GFR calc non Af Amer: 32 mL/min — ABNORMAL LOW (ref >60)
GFR, EST AFRICAN AMERICAN: 37 mL/min — AB (ref >60)
Glucose, Bld: 157 mg/dL — ABNORMAL HIGH (ref 65–99)
Potassium: 4.2 mmol/L (ref 3.5–5.1)
SODIUM: 132 mmol/L — AB (ref 135–145)

## 2016-09-11 LAB — CORTISOL: CORTISOL PLASMA: 20.2 ug/dL

## 2016-09-11 NOTE — Progress Notes (Signed)
Location:   [facility] Room Number: 132/P Place of Service:  SNF [phone]) Provider:  Tonette Bihari, Mary-Margaret, FNP  Patient Care Team: Bennie Pierini, FNP as PCP - General (Nurse Practitioner) Derryl Harbor, OD as Consulting Physician [facility])  Extended Emergency Contact Information Primary Emergency Contact: Gibson,Reba Address: [address] [place], [place] [address] [place] of [place] Home Phone: [phone] Work Phone: [phone] Relation: Sister  Code Status:  Full Code Goals of care: Advanced Directive information Advanced Directives 09/11/2016  Does Patient Have a Medical Advance Directive? Yes  Type of Advance Directive (No Data)  Does patient want to make changes to medical advance directive? No - Patient declined  Copy of Healthcare Power of Attorney in Chart? -  Would patient like information on creating a medical advance directive? No - Patient declined    Chief complaint acute visit follow-up hypokalemia and atrial fibrillation   HPI:  Pt is a 81 y.o. female seen today for an acute visit for follow-up of hypokalemia and atrial fibrillation.  Patient was hospitalized from July 31 2 August 4 for symptomatic anemia and acute kidney injury.  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  She was diuresed with Lasix and initially on discharge was stable but had anorexia and progressive weakness.  Day before readmission she had fallen was unable to bear weight because of weakness.  She does have a history of vertigo.  CT scan did not show any acute abnormality.  She received IV fluids and her creatinine improved.  Anemia panel showed normal B12 and folate fair 10-I saturation was 32% she got 2 units of packed red blood cells her hemoglobin rose to 10.  Endoscopies were recommended after she stabilized.  Labs  yesterday showed hypokalemia with a value of 2.6 creatinine had improved to 1.44.  Hemoglobin was 10.2.  When Dr. Alwyn Ren evaluated yesterday she was tachycardic with an irregular rate-EKG shows suspected atrial fibrillation with rapid ventricular rate Dr. Alwyn Ren did increase her Coreg.  And started her on Eilquis-2.5 mg twice a day he  based on renal function and age and body weight  Regards potassium he did supplement this as well.   She appeared to be stable clinically yesterday.  Today her heart rate appears to be regular and in the 70s her potassium has normalized at 4.2 she does not have any complaints she had therapy this morning and said it went well she appears to be doing well vital signs are stable       Past Medical History:  Diagnosis Date  . Allergy   . Cataract   . Diabetes mellitus without complication (HCC)   . Hyperlipidemia   . Hypertension   . Thyroid disease    Past Surgical History:  Procedure Laterality Date  . ABDOMINAL HYSTERECTOMY     partial but later ovaries removed  . EYE SURGERY Bilateral    cataract  . OOPHORECTOMY Bilateral     No Known Allergies  Outpatient Encounter Prescriptions as of 09/11/2016  Medication Sig  . apixaban (ELIQUIS) 2.5 MG TABS tablet Take 2.5 mg by mouth 2 (two) times daily.  Marland Kitchen atorvastatin (LIPITOR) 80 MG tablet Take 1 tablet (80 mg total) by mouth daily at 6 PM.  . bisacodyl (DULCOLAX) 5 MG EC tablet Take 2 tablets (10 mg total) by mouth daily as needed for mild  constipation or moderate constipation.  . calcium carbonate (OS-CAL) 600 MG TABS tablet Take 600 mg by mouth every other day.   . carvedilol (COREG) 12.5 MG tablet 1 bid  . docusate sodium (COLACE) 100 MG capsule Take 2 capsules (200 mg total) by mouth 2 (two) times daily.  . hydrALAZINE (APRESOLINE) 50 MG tablet Take 1 tablet (50 mg total) by mouth every 8 (eight) hours.  . isosorbide mononitrate (IMDUR) 30 MG 24 hr tablet Take 1 tablet (30 mg total) by  mouth daily.  Marland Kitchen levothyroxine (SYNTHROID, LEVOTHROID) 88 MCG tablet Take 1 tablet (88 mcg total) by mouth daily before breakfast.  . Omega-3 Fatty Acids (FISH OIL) 1000 MG CAPS Take 1 capsule by mouth every other day.   . polyethylene glycol (MIRALAX / GLYCOLAX) packet Take 17 g by mouth daily.   No facility-administered encounter medications on file as of 09/11/2016.     Review of Systems   Gen. she is not complaining of any fever or chills says she feels well says she actually enjoyed therapy.  Skin does not complain of rashes or itching.  Head ears eyes nose mouth and throat does not complaining of sore throat at this time does have history of wind blowing in her ears with ringing and hearing loss but is not specifically complaining of that today.  Respiratory is not complaining of shortness breath or cough.  Cardiac denies chest pain or palpitations does not appear to have significant lower extremity edema.  GI does not complain of any abdominal pain nausea vomiting diarrhea --had complained of constipation in the past but apparently is doing better today.  GU does not complain of dysuria.  Musculoskeletal does not complain of joint pain does have weakness lower extremities.  Neurologic is not complaining of headache or syncope-does have a history of vertigo in the past but is not currently complaining of this.  Psych does not complain of overt anxiety or depression appears to be in good spirits in fact she just had a visit from her minister   Immunization History  Administered Date(s) Administered  . Influenza, High Dose Seasonal PF 10/16/2012, 11/03/2013, 11/02/2014, 10/26/2015  . Pneumococcal Conjugate-13 07/29/2014  . Pneumococcal Polysaccharide-23 11/14/2008  . Zoster 03/14/2008   Pertinent  Health Maintenance Due  Topic Date Due  . FOOT EXAM  10/11/2016 (Originally 07/10/2016)  . OPHTHALMOLOGY EXAM  10/11/2016 (Originally 06/22/2016)  . DEXA SCAN  10/11/2016 (Originally  07/15/2016)  . INFLUENZA VACCINE  01/05/2017 (Originally 09/05/2016)  . URINE MICROALBUMIN  10/25/2016  . HEMOGLOBIN A1C  02/03/2017  . PNA vac Low Risk Adult  Completed   Fall Risk  08/17/2016 05/07/2016 04/23/2016 11/22/2015 11/15/2015  Falls in the past year? Yes Yes Yes No No  Number falls in past yr: 1 1 1  - -  Injury with Fall? No Yes No - -  Comment - Bruised chin and face. Laceration on lip. - - -  Risk for fall due to : - - - - -   Functional Status Survey:    Vitals:   09/11/16 1122  BP: 113/64  Pulse: 75  Resp: 18  Temp: 98 F (36.7 C)  TempSrc: Oral  Weight is 116.6 pounds  Physical Exam   In general this is a pleasant somewhat thin elderly female in no distress.  Her skin is warm and dry. She does appear to have some chronic old bruising sites diffuse  Eyes visual acuity appears grossly intact sclera and conjunctiva are clear.  Ears  she is hard of hearing.  Chest is clear to auscultation there is no labored breathing breath sounds are slightly reduced.  Heart is regular rate and rhythm without murmur gallop around she does not have significant lower extremity  Abdomen is soft somewhat protuberant nontender with positive bowel sounds.  Musculoskeletal is able to move all extremities 4 strength appears to be intact all 4 extremities with some lower extremity weakness I suspect an element of deconditioning she ambulates in a wheelchair.  Neurologic is grossly intact her speech is clear no lateralizing findings.  Psych she is alert and oriented pleasant and appropriate     Labs reviewed:  Recent Labs  08/05/16 0621  09/05/16 0555 09/10/16 1000 09/11/16 0700  NA  --   < > 133* 132* 132*  K  --   < > 3.5 2.6* 4.2  CL  --   < > 97* 97* 99*  CO2  --   < > 24 24 23   GLUCOSE  --   < > 177* 123* 157*  BUN  --   < > 84* 43* 45*  CREATININE  --   < > 2.88* 1.44* 1.49*  CALCIUM  --   < > 8.4* 7.7* 8.0*  MG 1.7  --   --   --   --   < > = values in this  interval not displayed.  Recent Labs  11/22/15 1128 04/23/16 0912 09/04/16 1846  AST 14 18 57*  ALT 5 8 42  ALKPHOS 84 76 106  BILITOT 0.5 0.6 2.7*  PROT 7.8 6.9 6.0*  ALBUMIN 4.3 4.0 2.5*    Recent Labs  09/04/16 1458 09/05/16 0555 09/06/16 0857 09/10/16 1000  WBC 13.3* 11.6* 9.7 6.8  NEUTROABS 10.9*  --   --  5.1  HGB 6.5* 6.0* 10.4* 10.2*  HCT 19.0* 17.8* 29.6* 30.0*  MCV 92.7 93.2 90.5 91.7  PLT 165 161 124* 95*   Lab Results  Component Value Date   TSH 1.986 08/04/2016   Lab Results  Component Value Date   HGBA1C 4.8 08/04/2016   Lab Results  Component Value Date   CHOL 129 04/23/2016   HDL 45 04/23/2016   LDLCALC 62 04/23/2016   TRIG 108 04/23/2016   CHOLHDL 2.9 04/23/2016    Significant Diagnostic Results in last 30 days:  Dg Chest 2 View  Result Date: 09/04/2016 CLINICAL DATA:  Fall EXAM: CHEST  2 VIEW COMPARISON:  08/04/2016 FINDINGS: Chronic lung disease with hyperinflation and prominent lung markings bilaterally. No focal consolidation. Negative for heart failure or effusion. No acute fracture is identified. IMPRESSION: Chronic lung disease. No acute abnormality. Improved aeration since the prior study. Electronically Signed   By: Marlan Palauharles  Clark M.D.   On: 09/04/2016 16:02   Ct Head Wo Contrast  Result Date: 09/04/2016 CLINICAL DATA:  Generalized weakness. Vertigo and dizziness. 2 falls in the past month. Weight loss. EXAM: CT HEAD WITHOUT CONTRAST TECHNIQUE: Contiguous axial images were obtained from the base of the skull through the vertex without intravenous contrast. COMPARISON:  None. FINDINGS: Brain: Diffusely enlarged ventricles and subarachnoid spaces. Patchy white matter low density in both cerebral hemispheres. No intracranial hemorrhage, mass lesion or CT evidence of acute infarction. Vascular: No hyperdense vessel or unexpected calcification. Skull: Mild bilateral hyperostosis frontalis. Sinuses/Orbits: Bilateral lens implants. Unremarkable  included paranasal sinuses. Other:  None. IMPRESSION: 1. No acute abnormality. 2. Mild to moderate diffuse cerebral and cerebellar atrophy. 3. Moderate chronic small vessel white matter ischemic  changes in both cerebral hemispheres. Electronically Signed   By: Beckie Salts M.D.   On: 09/04/2016 16:21    Assessment/Plan  #1 hypokalemia-this has been supplemented and has normalized this will have to be watched however she is not on a diuretic-at this point will update a BMP tomorrow for follow-up clinically she appears to be stable in this regards.  #2 atrial fibrillation-this appears rate controlled since Coreg was increased-she is on  Eliquis for anticoagulation-Dr. Alwyn Ren did order an EKG today for follow-up will await those results.  Clinically this sounds like sinus rhythm however appears to be controlled.  #3 history of anemia we will update a CBC tomorrow as well--she did receive a transfusion in the hospital and hemoglobin rose up to-10.2--again will await update lab results--hemoglobin was down to-6 before transfusion Again recommendation for colonoscopy once patient has a. If stability    #4 history of chronic kidney disease creatinine appears to have stabilized at 1.49 BUN of 45 on lab today will update this again tomorrow she also has mild hyponatremia at 132 which appears to be stable.  #5 history of combined systolic and diastolic CHF ejection fraction 35-40% on echo 08/05/2016 with grade 1 diastolic dysfunction she is not on a diuretic I suspect this is complicated with her renal issues-will order daily weights notify provider of gain greater than 3 pounds clinically she appears to be stable at this point--unknown BMP in the hospital on 08/04/2016 was 2738  She continues on Coreg and Imdur  #6-History of hypothyroidism on Synthroid TSH was normal at 1.986  on June 30  #7 history diabetes-hemoglobin A1c was only 4.8 on June 30-she is not on any medication for blood sugar was 181  this morning   158 yesterday am... PM  sugars are slightly above 200 at this point will monitor  #8 history of hyperlipidemia she is on Lipitor-lipid profile March showed a LDL of 62  Again will await update EKG and cortisol level also has been ordered awaiting those results-will update a CBC and BMP tomorrow to keep an eye on her hemoglobin as well as renal function and potassium Also weights will have to be watched    [id number]-of note greater than 40 minutes spent assessing patient-discussing her status with nursing staff-reviewing her chart-reviewing her labs-and coordinating and formulating a plan of care for numerous diagnoses-of note greater than 50% of time spent coordinating plan of care with input as noted above  Addendum-we have obtained the updated EKG which now shows normal sinus rhythm with a rate in the 70s which is comparable to what I was able to auscultate at this point will monitor-EKG findings and patient's status and plan of care was discussed with Dr. Alwyn Ren via phone

## 2016-09-12 ENCOUNTER — Encounter: Payer: Self-pay | Admitting: Physician Assistant

## 2016-09-12 ENCOUNTER — Other Ambulatory Visit (HOSPITAL_COMMUNITY)
Admission: RE | Admit: 2016-09-12 | Discharge: 2016-09-12 | Disposition: A | Discharge status: Home / Self Care | Payer: Medicare Other | Source: Skilled Nursing Facility | Attending: Internal Medicine | Admitting: Internal Medicine

## 2016-09-12 ENCOUNTER — Ambulatory Visit (INDEPENDENT_AMBULATORY_CARE_PROVIDER_SITE_OTHER): Payer: Medicare Other | Admitting: Physician Assistant

## 2016-09-12 VITALS — BP 120/64 | HR 70 | Ht 62.0 in | Wt 108.0 lb

## 2016-09-12 DIAGNOSIS — I480 Paroxysmal atrial fibrillation: Secondary | ICD-10-CM

## 2016-09-12 DIAGNOSIS — N183 Chronic kidney disease, stage 3 unspecified: Secondary | ICD-10-CM

## 2016-09-12 DIAGNOSIS — R9439 Abnormal result of other cardiovascular function study: Secondary | ICD-10-CM | POA: Diagnosis not present

## 2016-09-12 DIAGNOSIS — I5042 Chronic combined systolic (congestive) and diastolic (congestive) heart failure: Secondary | ICD-10-CM

## 2016-09-12 DIAGNOSIS — D649 Anemia, unspecified: Secondary | ICD-10-CM

## 2016-09-12 DIAGNOSIS — I110 Hypertensive heart disease with heart failure: Secondary | ICD-10-CM | POA: Diagnosis not present

## 2016-09-12 DIAGNOSIS — I10 Essential (primary) hypertension: Secondary | ICD-10-CM | POA: Insufficient documentation

## 2016-09-12 DIAGNOSIS — F17200 Nicotine dependence, unspecified, uncomplicated: Secondary | ICD-10-CM | POA: Diagnosis not present

## 2016-09-12 LAB — CBC WITH DIFFERENTIAL/PLATELET
BASOS ABS: 0 10*3/uL (ref 0.0–0.1)
BASOS PCT: 0 %
EOS ABS: 0.2 10*3/uL (ref 0.0–0.7)
EOS PCT: 3 %
HCT: 29.6 % — ABNORMAL LOW (ref 36.0–46.0)
Hemoglobin: 10.2 g/dL — ABNORMAL LOW (ref 12.0–15.0)
Lymphocytes Relative: 20 %
Lymphs Abs: 1.2 10*3/uL (ref 0.7–4.0)
MCH: 31.5 pg (ref 26.0–34.0)
MCHC: 34.5 g/dL (ref 30.0–36.0)
MCV: 91.4 fL (ref 78.0–100.0)
Monocytes Absolute: 0.5 10*3/uL (ref 0.1–1.0)
Monocytes Relative: 8 %
Neutro Abs: 4.4 10*3/uL (ref 1.7–7.7)
Neutrophils Relative %: 70 %
PLATELETS: 112 10*3/uL — AB (ref 150–400)
RBC: 3.24 MIL/uL — AB (ref 3.87–5.11)
RDW: 20.2 % — ABNORMAL HIGH (ref 11.5–15.5)
WBC: 6.3 10*3/uL (ref 4.0–10.5)

## 2016-09-12 LAB — BASIC METABOLIC PANEL
Anion gap: 10 (ref 5–15)
BUN: 45 mg/dL — AB (ref 6–20)
CALCIUM: 8.3 mg/dL — AB (ref 8.9–10.3)
CO2: 23 mmol/L (ref 22–32)
Chloride: 99 mmol/L — ABNORMAL LOW (ref 101–111)
Creatinine, Ser: 1.5 mg/dL — ABNORMAL HIGH (ref 0.44–1.00)
GFR calc Af Amer: 37 mL/min — ABNORMAL LOW (ref >60)
GFR, EST NON AFRICAN AMERICAN: 32 mL/min — AB (ref >60)
Glucose, Bld: 196 mg/dL — ABNORMAL HIGH (ref 65–99)
POTASSIUM: 4.2 mmol/L (ref 3.5–5.1)
SODIUM: 132 mmol/L — AB (ref 135–145)

## 2016-09-12 NOTE — Progress Notes (Signed)
Cardiology Office Note:    Date:  09/12/2016   ID:  Michele Walters, DOB 01/26/1936, MRN [id number]  PCP:  Bennie PieriniMartin, Mary-Margaret, FNP  Cardiologist:  Dr. Delane GingerPhil Nahser    Referring MD: Bennie PieriniMartin, Michele Walters, *   Chief Complaint  Patient presents with  . Atrial Fibrillation    History of Present Illness:     Michele Walters is a 81 y.o. female with a hx of combined systolic and diastolic CHF, presumed CAD, anemia, HTN, DM2, CKD, tobacco abuse, hypothyroidism, LBBB who is being seen today for the evaluation of atrial fibrillation at the request of [name].   She was initially seen by Dr. Delane GingerPhil Nahser in consultation during a hospitalization in late June 2018 for acute combined systolic and diastolic CHF.  She complained of chest discomfort. However, cardiac enzymes remained negative. Echocardiogram revealed reduced LV function with EF 35-40% and mild diastolic dysfunction. She had worsening renal function. Therefore, noninvasive testing was undertaken. Her stress test was intermediate risk. There was a large anteroseptal, septal and inferoseptal partially reversible defect. It was not clear if this was ischemia or related to LBBB. Images reviewed by Dr. Elease HashimotoNahser who felt that there was no significant reversible defect. Given her high risk for contrast induced nephropathy, medical management was recommended. She was not placed on ACE or ARB or spironolactone due to CKD. She was seen in follow-up by Joni ReiningKathryn Lawrence, NP 08/14/16 in [place].  The patient requested follow up in [place] in the future ([place]oser to home).  She was admitted to the hospital again 7/31-8/4 with symptomatic anemia (Hgb 6.5) and AKI (Creatinine 3.24).  She was transfused with 2 units PRBCs.  Her Creatinine improved with IVFs.  Her Hgb improved to 10.  Hemoccult was neg and her B12, Folate and Ferritin were normal.  OP GI workup was recommended.  She was DC to SNF.    [facility].  She was noted  to be in AF with RVR recently and placed on Apixaban 2.5 bid (CHADS2-VASc=7 - presumed CAD, 81 yo, female, CHF, DM, HTN).   Carvedilol was increased for rate control.  Notes from SNF indicated an ECG yesterday documented return of NSR.  K+ was recently low at 2.6 and was replaced.  Today, K+ is 4.2.  Creatinine is improved overall (today 1.50).  Hgb stable at 10.2    [name] is here today with a nurse from the SNF.  She tells me that she feels well. She is working with PT. She gets tired but does not have any chest pain, shortness of breath, edema.  She has not experienced orthopnea, paroxysmal nocturnal dyspnea, syncope.  She has not seen any blood in her stools or urine.    Prior CV studies:   The following studies were reviewed today:  Myoview 08/06/16 Large anteroseptal, septal, inferoseptal partially reversible defect-ischemia versus LBBB related defect, EF 39, intermediate risk  Echo 08/05/16 Moderate to severe LVH, focal basal hypertrophy, EF 35-40, grade 1 diastolic dysfunction, mild aortic stenosis (mean 8, peak 15) mild MR, moderate LAE, mild to moderate TR, PASP 44  V/Q 08/04/16 Low probability for pulmonary embolism  Past Medical History:  Diagnosis Date  . Allergy   . Cataract   . Diabetes mellitus without complication (HCC)   . Hyperlipidemia   . Hypertension   . Thyroid disease     Past Surgical History:  Procedure Laterality Date  . ABDOMINAL HYSTERECTOMY     partial  but later ovaries removed  . EYE SURGERY Bilateral    cataract  . OOPHORECTOMY Bilateral     Current Medications: Current Meds  Medication Sig  . apixaban (ELIQUIS) 2.5 MG TABS tablet Take 2.5 mg by mouth 2 (two) times daily.  Marland Kitchen atorvastatin (LIPITOR) 80 MG tablet Take 1 tablet (80 mg total) by mouth daily at 6 PM.  . bisacodyl (DULCOLAX) 5 MG EC tablet Take 2 tablets (10 mg total) by mouth daily as needed for mild constipation or moderate constipation.  . calcium carbonate (OS-CAL) 600 MG TABS  tablet Take 600 mg by mouth every other day.   . carvedilol (COREG) 12.5 MG tablet Take 12.5 mg by mouth 2 (two) times daily with a meal. 1 bid  . docusate sodium (COLACE) 100 MG capsule Take 2 capsules (200 mg total) by mouth 2 (two) times daily.  . hydrALAZINE (APRESOLINE) 50 MG tablet Take 1 tablet (50 mg total) by mouth every 8 (eight) hours.  . isosorbide mononitrate (IMDUR) 30 MG 24 hr tablet Take 1 tablet (30 mg total) by mouth daily.  Marland Kitchen levothyroxine (SYNTHROID, LEVOTHROID) 88 MCG tablet Take 1 tablet (88 mcg total) by mouth daily before breakfast.  . Omega-3 Fatty Acids (FISH OIL) 1000 MG CAPS Take 1 capsule by mouth every other day.   . polyethylene glycol (MIRALAX / GLYCOLAX) packet Take 17 g by mouth daily.     Allergies:   Patient has no known allergies.   Social History   Social History  . Marital status: Single    Spouse name: N/A  . Number of children: N/A  . Years of education: N/A   Occupational History  . Retired    Social History Main Topics  . Smoking status: Former Smoker    Packs/day: 1.00    Years: 55.00    Types: Cigarettes    Quit date: 08/03/2016  . Smokeless tobacco: Never Used  . Alcohol use No  . Drug use: No  . Sexual activity: No   Other Topics Concern  . None   Social History Narrative  . None     Family Hx: The patient's family history includes COPD in her father; Down syndrome in her brother; Emphysema in her father; Heart attack in her mother; Heart disease in her mother.  ROS:   Please see the history of present illness.    Review of Systems  Constitution: Positive for decreased appetite.  HENT: Positive for hearing loss.   Respiratory: Positive for cough.    All other systems reviewed and are negative.   EKGs/Labs/Other Test Reviewed:    EKG:  EKG is  ordered today.  The ekg ordered today demonstrates NSR, HR 69, normal axis, T-wave inversions in 3, aVF, V1-V4, QTc 471 ms  Recent Labs: 08/04/2016: B Natriuretic Peptide  2,738.8; TSH 1.986 08/05/2016: Magnesium 1.7 09/04/2016: ALT 42 09/12/2016: BUN 45; Creatinine, Ser 1.50; Hemoglobin 10.2; Platelets 112; Potassium 4.2; Sodium 132   Recent Lipid Panel Lab Results  Component Value Date/Time   CHOL 129 04/23/2016 09:12 AM   TRIG 108 04/23/2016 09:12 AM   TRIG 177 (H) 01/22/2014 12:07 PM   HDL 45 04/23/2016 09:12 AM   HDL 39 (L) 01/22/2014 12:07 PM   CHOLHDL 2.9 04/23/2016 09:12 AM   LDLCALC 62 04/23/2016 09:12 AM   LDLCALC 81 05/12/2013 04:10 PM    Physical Exam:    VS:  BP 120/64   Pulse 70   Ht 5\' 2"  (1.575 m)   Wt  108 lb (49 kg)   BMI 19.75 kg/m     Wt Readings from Last 3 Encounters:  09/12/16 108 lb (49 kg)  09/05/16 114 lb 11.2 oz (52 kg)  08/17/16 113 lb (51.3 kg)     Physical Exam  Constitutional: She is oriented to person, place, and time. She appears well-developed and well-nourished. No distress.  HENT:  Head: Normocephalic and atraumatic.  Neck: No JVD present.  Cardiovascular: Normal rate and regular rhythm.   Murmur heard.  Mid to late systolic murmur is present with a grade of 2/6  at the lower left sternal border Pulmonary/Chest: She has decreased breath sounds. She has no wheezes. She has no rales.  Abdominal: Soft. There is no hepatomegaly. There is no tenderness.  Musculoskeletal: She exhibits no edema.  Neurological: She is alert and oriented to person, place, and time.  Skin: Skin is warm and dry.  Psychiatric: She has a normal mood and affect.    ASSESSMENT:    1. Paroxysmal atrial fibrillation (HCC)   2. Chronic combined systolic (congestive) and diastolic (congestive) heart failure (HCC)   3. Hypertensive heart disease with chronic combined systolic and diastolic congestive heart failure (HCC)   4. Abnormal nuclear stress test   5. CKD (chronic kidney disease) stage 3, GFR 30-59 ml/min   6. Normocytic anemia   7. Smoker    PLAN:    In order of problems listed above:  1. Paroxysmal atrial fibrillation  (HCC) -  She was recently noted to be in AF with RVR.  However, after increasing her beta-blocker, she had return of NSR and is in NSR today.  She had no symptoms while in AFib.  She has a high stroke risk with CHADS2-VASc=7.  Therefore, she would benefit from anticoagulation.  However, she had a recent admission with symptomatic anemia and assoc AKI.  Thus far, she has not had any evidence of bleeding.   Weight is 49 kg, Creatinine 1.44-3.24 in the last 2 weeks >> Apixaban 2.5 mg BID is probably correct dose of anticoagulation for now in light of recent hx of symptomatic anemia.  She will need close follow up on her renal function and hemoglobin.  If her hemoglobin drops, Apixaban will need to be DC'd.  If her Creatinine remains < 1.5, will need to consider +/- increase Apixaban to 5 mg.    -  Continue Apixaban 2.5 mg Twice daily   -  Continue current dose of beta-blocker   -  Request ECGs from SNF that demonstrates AFib  -  Plan repeat BMET, CBC in 2 weeks  -  Keep FU with Dr. Rollene Rotunda in 3 weeks  2. Chronic combined systolic (congestive) and diastolic (congestive) heart failure (HCC) - Volume appears stable.  NYHA 2.  She is not on ACE inhibitor, angiotensin receptor blocker due to recent AKI.  Continue beta-blocker, hydralazine, nitrates.    3. Hypertensive heart disease with chronic combined systolic and diastolic congestive heart failure (HCC) - The patient's blood pressure is controlled on her current regimen.  Continue current therapy.    4. Abnormal nuclear stress test - Presumed CAD based upon hx of DM, prior smoking, abnormal perfusion on nuclear study.  She is not having angina.  She is not on ASA as she is on Apixaban.  She was previously felt to be high risk for contrast induced nephropathy.  If she has refractory angina and her renal function continues to improve, Cardiac Catheterization could be considered.  Otherwise,  continue medical management.    -  Continue nitrates,  statin, beta-blocker   5. CKD (chronic kidney disease) stage 3, GFR 30-59 ml/min Recent creatinine improved.  6. Normocytic anemia - S/p transfusion with PRBCs x 2 during recent admission.  Etiology was not clear for drop in hemoglobin.  She will need OP GI workup.  She is not having any symptoms of bleeding on anticoagulation.  She will need close follow up on her hemoglobin.   7. Smoker - She is not currently smoking.    Dispo:  Return in 3 weeks (on 10/03/2016) for Scheduled Follow Up w/ Dr. Antoine Poche in [place].    Medication Adjustments/Labs and Tests Ordered: Current medicines are reviewed at length with the patient today.  Concerns regarding medicines are outlined above.  Tests Ordered: Orders Placed This Encounter  Procedures  . EKG 12-Lead   Medication Changes: No orders of the defined types were placed in this encounter.   Signed, Tereso Newcomer, PA-C  09/12/2016 12:36 PM    [facility] [address] [place], [place], [place]  [address] Phone: [phone]; Fax: [phone]

## 2016-09-12 NOTE — Patient Instructions (Addendum)
Medication Instructions:  Your physician recommends that you continue on your current medications as directed. Please refer to the Current Medication list given to you today.   Labwork: YOU WILL NEED TO HAVE LAB WORK IN 2 WEEKS SOMETIME THE WEEK OF 09/24/16; PLEASE HAVE RESULTS FAXED TO [facility], [facility] [phone]  IF YOU ARE HOME BY THE WEEK OF 8/20THEN PLEASE HAVE LAB WORK DONE WITH PRIMARY CARE AND [place] RES[place], [facility] [phone]  Testing/Procedures: NONE ORDERED  Follow-Up: KEEP UPCOMING APPT WITH DR. HOCHREIN 10:40 ON 10/03/16 IN THE [place] OFFICE   Any Other Special Instructions Will Be Listed Below (If Applicable).     If you need a refill on your cardiac medications before your next appointment, please call your pharmacy.

## 2016-09-13 ENCOUNTER — Telehealth: Payer: Self-pay | Admitting: Physician Assistant

## 2016-09-13 ENCOUNTER — Encounter (HOSPITAL_COMMUNITY)
Admission: RE | Admit: 2016-09-13 | Discharge: 2016-09-13 | Disposition: A | Discharge status: Home / Self Care | Payer: Medicare Other | Source: Other Acute Inpatient Hospital | Attending: Internal Medicine | Admitting: Internal Medicine

## 2016-09-13 ENCOUNTER — Non-Acute Institutional Stay (SKILLED_NURSING_FACILITY): Payer: Medicare Other | Admitting: Internal Medicine

## 2016-09-13 ENCOUNTER — Encounter: Payer: Self-pay | Admitting: Internal Medicine

## 2016-09-13 DIAGNOSIS — I480 Paroxysmal atrial fibrillation: Secondary | ICD-10-CM

## 2016-09-13 DIAGNOSIS — E785 Hyperlipidemia, unspecified: Secondary | ICD-10-CM | POA: Insufficient documentation

## 2016-09-13 DIAGNOSIS — E039 Hypothyroidism, unspecified: Secondary | ICD-10-CM | POA: Insufficient documentation

## 2016-09-13 DIAGNOSIS — N183 Chronic kidney disease, stage 3 unspecified: Secondary | ICD-10-CM

## 2016-09-13 DIAGNOSIS — I10 Essential (primary) hypertension: Secondary | ICD-10-CM | POA: Insufficient documentation

## 2016-09-13 DIAGNOSIS — R17 Unspecified jaundice: Secondary | ICD-10-CM | POA: Diagnosis not present

## 2016-09-13 DIAGNOSIS — I5042 Chronic combined systolic (congestive) and diastolic (congestive) heart failure: Secondary | ICD-10-CM | POA: Diagnosis not present

## 2016-09-13 DIAGNOSIS — E1121 Type 2 diabetes mellitus with diabetic nephropathy: Secondary | ICD-10-CM | POA: Insufficient documentation

## 2016-09-13 DIAGNOSIS — D649 Anemia, unspecified: Secondary | ICD-10-CM

## 2016-09-13 LAB — COMPREHENSIVE METABOLIC PANEL
ALBUMIN: 2.7 g/dL — AB (ref 3.5–5.0)
ALT: 32 U/L (ref 14–54)
AST: 32 U/L (ref 15–41)
Alkaline Phosphatase: 96 U/L (ref 38–126)
Anion gap: 8 (ref 5–15)
BILIRUBIN TOTAL: 2.8 mg/dL — AB (ref 0.3–1.2)
BUN: 44 mg/dL — ABNORMAL HIGH (ref 6–20)
CO2: 26 mmol/L (ref 22–32)
Calcium: 8.5 mg/dL — ABNORMAL LOW (ref 8.9–10.3)
Chloride: 98 mmol/L — ABNORMAL LOW (ref 101–111)
Creatinine, Ser: 1.53 mg/dL — ABNORMAL HIGH (ref 0.44–1.00)
GFR calc Af Amer: 36 mL/min — ABNORMAL LOW (ref >60)
GFR calc non Af Amer: 31 mL/min — ABNORMAL LOW (ref >60)
Glucose, Bld: 257 mg/dL — ABNORMAL HIGH (ref 65–99)
POTASSIUM: 4.3 mmol/L (ref 3.5–5.1)
Sodium: 132 mmol/L — ABNORMAL LOW (ref 135–145)
TOTAL PROTEIN: 6.4 g/dL — AB (ref 6.5–8.1)

## 2016-09-13 NOTE — Telephone Encounter (Signed)
Made Michele Walters aware that unfortunately I could not speak to her, since we did not have anything on file giving permission to speak to her. She verbalized understanding.

## 2016-09-13 NOTE — Progress Notes (Signed)
Location:   [facility] Room Number: 132/P Place of Service:  SNF [phone]) Provider:  Tonette BihariArlo Electa Sterry  Martin, Mary-Margaret, FNP  Patient Care Team: Bennie PieriniMartin, Mary-Margaret, FNP as PCP - General (Nurse Practitioner) Derryl Harborran, Truc Ly, OD as Consulting Physician [facility](Optometry)  Extended Emergency Contact Information Primary Emergency Contact: Gibson,Reba Address: [address]          [place], [place] [id number] [place] Home Phone: [phone] Work Phone: [phone] Relation: Sister  Code Status:  Full Code Goals of care: Advanced Directive information Advanced Directives 09/13/2016  Does Patient Have a Medical Advance Directive? Yes  Type of Advance Directive (No Data)  Does patient want to make changes to medical advance directive? No - Patient declined  Copy of Healthcare Power of Attorney in Chart? -  Would patient like information on creating a medical advance directive? No - Patient declined     Chief Complaint  Patient presents with  . Acute Visit    Bilirubin Elevated    HPI:  Pt is a 81 y.o. female seen today for an acute visit for Follow-up of elevated bilirubin.   Patient was hospitalized from July 31 2 August 4 for symptomatic anemia and acute kidney injury.  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  She was diuresed with Lasix and initially on discharge was stable but had anorexia and progressive weakness.  Day before readmission she had fallen was unable to bear weight because of weakness.  She does have a history of vertigo.  CT scan did not show any acute abnormality.  She received IV fluids and her creatinine improved.  Anemia panel showed normal B12 and folate fair 10-I saturation was 32% she got 2 units of packed red blood cells her hemoglobin rose to 10.  Endoscopies were recommended after she stabilized   She  shortly after her admission here lab showed a hypokalemia of 2.6 although creatinine had improved to 1.44-she was evaluated by Dr. Alwyn RenHopper she was found to be tachycardic with an irregular rate-EKG showed suspected atrial fibrillation with rapid ventricular rate and Dr. Alwyn RenHopper did increase her Coreg and started her on Eliquis--low dose 2.5 mg twice a day.  Potassium was supplemented aggressively as well.  The potassium actually quickly normalized but next day was in the low 4's-heart rate was controlled with the Coreg.  She actually saw cardiology yesterday and was thought to be stable with recommendation to continue current dose of  Eliquis as well as the Coreg.  I do note on routine lab ordered today that renal function appears relatively stable with a creatinine 1.53- potassium is normalized at 4.3 sodium is 132 which appears relatively baseline as well.  Also ordered liver function tests which did show an elevated bilirubin of 2.8 per review of hospital labs it appears the bilirubin was 2.7---9 days ago in the hospital.  She does not appear to be overtly symptomatic she does not complaining of any abdominal discomfort nausea or vomiting-therapy did think that she appeared to be somewhat jaundiced however yesterday.  She has no complaints her vital signs are stable does not complain of any shortness of breath her heart rate appears to be well controlled      Past Medical History:  Diagnosis Date  . Allergy   . Cataract   . Diabetes mellitus without complication (HCC)   . Hyperlipidemia   . Hypertension   . Thyroid disease  Past Surgical History:  Procedure Laterality Date  . ABDOMINAL HYSTERECTOMY     partial but later ovaries removed  . EYE SURGERY Bilateral    cataract  . OOPHORECTOMY Bilateral     No Known Allergies  Outpatient Encounter Prescriptions as of 09/13/2016  Medication Sig  . apixaban (ELIQUIS) 2.5 MG TABS tablet Take 2.5 mg by mouth 2 (two) times daily.  Marland Kitchen  atorvastatin (LIPITOR) 80 MG tablet Take 1 tablet (80 mg total) by mouth daily at 6 PM.  . bisacodyl (DULCOLAX) 5 MG EC tablet Take 2 tablets (10 mg total) by mouth daily as needed for mild constipation or moderate constipation.  . calcium carbonate (OS-CAL) 600 MG TABS tablet Take 600 mg by mouth every other day.   . carvedilol (COREG) 12.5 MG tablet Take 12.5 mg by mouth 2 (two) times daily with a meal. 1 bid  . docusate sodium (COLACE) 100 MG capsule Take 2 capsules (200 mg total) by mouth 2 (two) times daily.  . hydrALAZINE (APRESOLINE) 50 MG tablet Take 1 tablet (50 mg total) by mouth every 8 (eight) hours.  . isosorbide mononitrate (IMDUR) 30 MG 24 hr tablet Take 1 tablet (30 mg total) by mouth daily.  Marland Kitchen levothyroxine (SYNTHROID, LEVOTHROID) 88 MCG tablet Take 1 tablet (88 mcg total) by mouth daily before breakfast.  . Omega-3 Fatty Acids (FISH OIL) 1000 MG CAPS Take 1 capsule by mouth every other day.   . polyethylene glycol (MIRALAX / GLYCOLAX) packet Take 17 g by mouth daily.   No facility-administered encounter medications on file as of 09/13/2016.     Review of Systems   Gen. she is not complaining of any fever or chills says she continues to feel well. Says sometimes she feels chilly but this is not new  Skin does not complain of rashes or itching.  Head ears eyes nose mouth and throat does not complaining of sore throat  Or visual changes  Respiratory is not complaining of shortness breath or cough.  Cardiac denies chest pain or palpitations does not appear to have significant lower extremity edema.  GI does not complain of any abdominal pain nausea vomiting diarrhea --had complained of constipation in the past   GU does not complain of dysuria.  Musculoskeletal does not complain of joint pain does have weakness lower extremities.  Neurologic is not complaining of headache or syncope-does have a history of vertigo in the past but is not currently complaining of  this.  Psych does not complain of overt anxiety or depression appears to be in good spirits   Immunization History  Administered Date(s) Administered  . Influenza, High Dose Seasonal PF 10/16/2012, 11/03/2013, 11/02/2014, 10/26/2015  . Pneumococcal Conjugate-13 07/29/2014  . Pneumococcal Polysaccharide-23 11/14/2008  . Zoster 03/14/2008   Pertinent  Health Maintenance Due  Topic Date Due  . FOOT EXAM  10/11/2016 (Originally 07/10/2016)  . OPHTHALMOLOGY EXAM  10/11/2016 (Originally 06/22/2016)  . DEXA SCAN  10/11/2016 (Originally 07/15/2016)  . INFLUENZA VACCINE  01/05/2017 (Originally 09/05/2016)  . URINE MICROALBUMIN  10/25/2016  . HEMOGLOBIN A1C  02/03/2017  . PNA vac Low Risk Adult  Completed   Fall Risk  08/17/2016 05/07/2016 04/23/2016 11/22/2015 11/15/2015  Falls in the past year? Yes Yes Yes No No  Number falls in past yr: 1 1 1  - -  Injury with Fall? No Yes No - -  Comment - Bruised chin and face. Laceration on lip. - - -  Risk for fall due to : - - - - -  Functional Status Survey:    Vitals:   09/13/16 1600  BP: 129/80  Pulse: 64  Resp: 18  Temp: (!) 97 F (36.1 C)  TempSrc: Oral  SpO2: 98%  Weight is stable at 117 pounds  Physical Exam In general this is a pleasant somewhat thin elderly female in no distress. Sitting comfortably in her wheelchair  Her skin is warm and dry. She does appear to have some chronic old bruising sites diffuse Her skin could possibly considered slightly jaundiced  Eyes visual acuity appears grossly intact sclera and conjunctiva are clear.  Ears she is hard of hearing.  Chest is clear to auscultation there is no labored breathing breath sounds are slightly reduced.  Heart is regular rate and rhythm with a slight systolic murmur  she does not have significant lower extremity  Abdomen is soft somewhat protuberant nontender with positive bowel sounds.  Musculoskeletal is able to move all extremities 4 strength appears to be     Neurologic is grossly intact her speech is clear no lateralizing findings.  Psych she is alert and oriented pleasant and appropriate  Labs reviewed:  Recent Labs  08/05/16 0621  09/11/16 0700 09/12/16 0800 09/13/16 1036  NA  --   < > 132* 132* 132*  K  --   < > 4.2 4.2 4.3  CL  --   < > 99* 99* 98*  CO2  --   < > 23 23 26   GLUCOSE  --   < > 157* 196* 257*  BUN  --   < > 45* 45* 44*  CREATININE  --   < > 1.49* 1.50* 1.53*  CALCIUM  --   < > 8.0* 8.3* 8.5*  MG 1.7  --   --   --   --   < > = values in this interval not displayed.  Recent Labs  04/23/16 0912 09/04/16 1846 09/13/16 1036  AST 18 57* 32  ALT 8 42 32  ALKPHOS 76 106 96  BILITOT 0.6 2.7* 2.8*  PROT 6.9 6.0* 6.4*  ALBUMIN 4.0 2.5* 2.7*    Recent Labs  09/04/16 1458  09/06/16 0857 09/10/16 1000 09/12/16 0800  WBC 13.3*  < > 9.7 6.8 6.3  NEUTROABS 10.9*  --   --  5.1 4.4  HGB 6.5*  < > 10.4* 10.2* 10.2*  HCT 19.0*  < > 29.6* 30.0* 29.6*  MCV 92.7  < > 90.5 91.7 91.4  PLT 165  < > 124* 95* 112*  < > = values in this interval not displayed. Lab Results  Component Value Date   TSH 1.986 08/04/2016   Lab Results  Component Value Date   HGBA1C 4.8 08/04/2016   Lab Results  Component Value Date   CHOL 129 04/23/2016   HDL 45 04/23/2016   LDLCALC 62 04/23/2016   TRIG 108 04/23/2016   CHOLHDL 2.9 04/23/2016    Significant Diagnostic Results in last 30 days:  Dg Chest 2 View  Result Date: 09/04/2016 CLINICAL DATA:  Fall EXAM: CHEST  2 VIEW COMPARISON:  08/04/2016 FINDINGS: Chronic lung disease with hyperinflation and prominent lung markings bilaterally. No focal consolidation. Negative for heart failure or effusion. No acute fracture is identified. IMPRESSION: Chronic lung disease. No acute abnormality. Improved aeration since the prior study. Electronically Signed   By: Marlan Palau M.D.   On: 09/04/2016 16:02   Ct Head Wo Contrast  Result Date: 09/04/2016 CLINICAL DATA:  Generalized  weakness. Vertigo and dizziness. 2  falls in the past month. Weight loss. EXAM: CT HEAD WITHOUT CONTRAST TECHNIQUE: Contiguous axial images were obtained from the base of the skull through the vertex without intravenous contrast. COMPARISON:  None. FINDINGS: Brain: Diffusely enlarged ventricles and subarachnoid spaces. Patchy white matter low density in both cerebral hemispheres. No intracranial hemorrhage, mass lesion or CT evidence of acute infarction. Vascular: No hyperdense vessel or unexpected calcification. Skull: Mild bilateral hyperostosis frontalis. Sinuses/Orbits: Bilateral lens implants. Unremarkable included paranasal sinuses. Other:  None. IMPRESSION: 1. No acute abnormality. 2. Mild to moderate diffuse cerebral and cerebellar atrophy. 3. Moderate chronic small vessel white matter ischemic changes in both cerebral hemispheres. Electronically Signed   By: Beckie Salts M.D.   On: 09/04/2016 16:21    Assessment/Plan  #1 elevated bilirubin-will order a fractionated bilirubin for follow-up-also will order an abdominal ultrasound to assess her liver.  Liver function tests otherwise were within normal range her albumin is 2.7.  Clinically she appears to be stable again possibly a slight evidence of jaundice here.  #2-history of severe hypokalemia this has been replenished and been stable we will update a metabolic panel again tomorrow.  #3 history of atrial fibrillation this appears rate controlled on current dose of Coreg-currently on low-dose  Eliquis as well-I see per cardiology  consideration that if creatinine is less than 1.5 and there is no evidence of bleeding and hemoglobin stable consider possibly increasing the dose of  Eliquis  #4-history of CHF-weight is stable clinically appears stable-- not a good candidate for ACE inhibitor because of her renal function- continues on a beta blocker hydralazine and nitrates.  .  #5 history of anemia this appears stable at 10.2 Will update CBC  tomorrow.  #6-history of chronic kidney disease apparently this appears stabilized as well creatinine 1.53 again will update this tomorrow and will need periodic monitoring in addition to her hemoglobin    again will update a CBC and metabolic panel tomorrow as well as fractionated bilirubin-also will write an order to obtain an ultrasound of the liver.  [id number]

## 2016-09-13 NOTE — Telephone Encounter (Signed)
New message    Pt power of attorney calling and wanting to know how pt appointment went and details if possible. Requests a call back

## 2016-09-14 ENCOUNTER — Encounter: Payer: Self-pay | Admitting: Internal Medicine

## 2016-09-14 ENCOUNTER — Other Ambulatory Visit (HOSPITAL_COMMUNITY)
Admission: AD | Admit: 2016-09-14 | Discharge: 2016-09-14 | Disposition: A | Discharge status: Home / Self Care | Payer: Medicare Other | Source: Skilled Nursing Facility | Attending: *Deleted | Admitting: *Deleted

## 2016-09-14 ENCOUNTER — Non-Acute Institutional Stay (SKILLED_NURSING_FACILITY): Payer: Medicare Other | Admitting: Internal Medicine

## 2016-09-14 DIAGNOSIS — N183 Chronic kidney disease, stage 3 unspecified: Secondary | ICD-10-CM

## 2016-09-14 DIAGNOSIS — D649 Anemia, unspecified: Secondary | ICD-10-CM

## 2016-09-14 DIAGNOSIS — E876 Hypokalemia: Secondary | ICD-10-CM | POA: Diagnosis not present

## 2016-09-14 DIAGNOSIS — I5042 Chronic combined systolic (congestive) and diastolic (congestive) heart failure: Secondary | ICD-10-CM | POA: Diagnosis not present

## 2016-09-14 DIAGNOSIS — R17 Unspecified jaundice: Secondary | ICD-10-CM

## 2016-09-14 DIAGNOSIS — I480 Paroxysmal atrial fibrillation: Secondary | ICD-10-CM

## 2016-09-14 LAB — BASIC METABOLIC PANEL
Anion gap: 6 (ref 5–15)
BUN: 41 mg/dL — AB (ref 6–20)
CALCIUM: 8 mg/dL — AB (ref 8.9–10.3)
CHLORIDE: 102 mmol/L (ref 101–111)
CO2: 25 mmol/L (ref 22–32)
CREATININE: 1.38 mg/dL — AB (ref 0.44–1.00)
GFR calc Af Amer: 41 mL/min — ABNORMAL LOW (ref >60)
GFR calc non Af Amer: 35 mL/min — ABNORMAL LOW (ref >60)
Glucose, Bld: 168 mg/dL — ABNORMAL HIGH (ref 65–99)
Potassium: 4.1 mmol/L (ref 3.5–5.1)
SODIUM: 133 mmol/L — AB (ref 135–145)

## 2016-09-14 LAB — CBC WITH DIFFERENTIAL/PLATELET
Basophils Absolute: 0 10*3/uL (ref 0.0–0.1)
Basophils Relative: 1 %
EOS ABS: 0.1 10*3/uL (ref 0.0–0.7)
EOS PCT: 3 %
HCT: 25.2 % — ABNORMAL LOW (ref 36.0–46.0)
HEMOGLOBIN: 8.8 g/dL — AB (ref 12.0–15.0)
LYMPHS ABS: 1.4 10*3/uL (ref 0.7–4.0)
Lymphocytes Relative: 30 %
MCH: 31.3 pg (ref 26.0–34.0)
MCHC: 34.9 g/dL (ref 30.0–36.0)
MCV: 89.7 fL (ref 78.0–100.0)
MONO ABS: 0.4 10*3/uL (ref 0.1–1.0)
MONOS PCT: 8 %
Neutro Abs: 2.8 10*3/uL (ref 1.7–7.7)
Neutrophils Relative %: 58 %
PLATELETS: 125 10*3/uL — AB (ref 150–400)
RBC: 2.81 MIL/uL — ABNORMAL LOW (ref 3.87–5.11)
RDW: 19.2 % — AB (ref 11.5–15.5)
WBC: 4.7 10*3/uL (ref 4.0–10.5)

## 2016-09-14 LAB — BILIRUBIN, DIRECT: Bilirubin, Direct: 0.8 mg/dL — ABNORMAL HIGH (ref 0.1–0.5)

## 2016-09-14 LAB — BILIRUBIN, TOTAL: BILIRUBIN TOTAL: 2.6 mg/dL — AB (ref 0.3–1.2)

## 2016-09-14 NOTE — Progress Notes (Signed)
This encounter was created in error - please disregard.

## 2016-09-14 NOTE — Progress Notes (Signed)
Location:   [facility] Room Number: 132/P Place of Service:  SNF [phone]) Provider:  Tonette Bihari, Mary-Margaret, FNP  Patient Care Team: Bennie Pierini, FNP as PCP - General (Nurse Practitioner) Derryl Harbor, OD as Consulting Physician [facility])  Extended Emergency Contact Information Primary Emergency Contact: Gibson,Reba Address: [address] [place], [place] [address] Darden [place] of [place] Home Phone: [phone] Work Phone: [phone] Relation: Sister  Code Status:  Full Code Goals of care: Advanced Directive information Advanced Directives 09/14/2016  Does Patient Have a Medical Advance Directive? Yes  Type of Advance Directive (No Data)  Does patient want to make changes to medical advance directive? No - Patient declined  Copy of Healthcare Power of Attorney in Chart? -  Would patient like information on creating a medical advance directive? No - Patient declined      Acute visit follow-up elevated bilirubin--also follow-up renal insufficiency-anemia--atrial fibrillation - HPI:  Pt is a 81 y.o. female seen today for an acute visit for numerous issues as noted above.  Routine lab testing has shown consistently elevated bilirubin in the 2.8 range-this was in the hospital as well as on follow-up lab done here.  She was noted to have some element of jaundice here as well.  It is not complaining of any nausea or abdominal pain or discomfort in this regard.  We have ordered fractionated bilirubin and direct bilirubin has come back elevated at 0.8.  She does not report any history of liver or GI difficulties in this regard-I do not see that she has had ultrasound done for workup done previously.  It is also noted her hemoglobin is down to 8.8 today and had him in the lower range.  She was recently started on low-dose Eliquis secondary to diagnosis atrial fibrillation-this was done earlier this week by Dr. Fannie Knee  was also in A. fib appears younger controlled now.  She also is seeing cardiology which said continue current treatment with Eliquis  But keep a  close eye on her hemoglobin and that if hemoglobin drops significantly adjustments will have to be made and and Eliquis discontinude --Laboratory showed a normal B12 and ferritin and folate.  She feels she is doing well does not report any decreased energy palpitations or chest pain shortness of breath or abdominal discomfort no nausea no vomiting or jaundice appearance does appear to persist  Regards to renal insufficiency this appears to have stabilized with creatinine1. 38-when she first came here she had significant hypokalemia but this has been supplemented and stabilized now potassium 4.1  It has been as low as 2.6     Past Medical History:  Diagnosis Date  . Allergy   . Cataract   . Diabetes mellitus without complication (HCC)   . Hyperlipidemia   . Hypertension   . Thyroid disease    Past Surgical History:  Procedure Laterality Date  . ABDOMINAL HYSTERECTOMY     partial but later ovaries removed  . EYE SURGERY Bilateral    cataract  . OOPHORECTOMY Bilateral     No Known Allergies  Outpatient Encounter Prescriptions as of 09/14/2016  Medication Sig  . apixaban (ELIQUIS) 2.5 MG TABS tablet Take 2.5 mg by mouth 2 (two) times daily.  Marland Kitchen atorvastatin (LIPITOR) 80 MG tablet Take 1 tablet (80 mg total) by mouth daily at 6 PM.  . bisacodyl (DULCOLAX) 5 MG EC tablet Take 2 tablets (10 mg total) by  mouth daily as needed for mild constipation or moderate constipation.  . calcium carbonate (OS-CAL) 600 MG TABS tablet Take 600 mg by mouth every other day.   . carvedilol (COREG) 12.5 MG tablet Take 12.5 mg by mouth 2 (two) times daily with a meal. 1 bid  . docusate sodium (COLACE) 100 MG capsule Take 2 capsules (200 mg total) by mouth 2 (two) times daily.  . hydrALAZINE (APRESOLINE) 50 MG tablet Take 1 tablet (50 mg total) by mouth every 8  (eight) hours.  . isosorbide mononitrate (IMDUR) 30 MG 24 hr tablet Take 1 tablet (30 mg total) by mouth daily.  Marland Kitchen levothyroxine (SYNTHROID, LEVOTHROID) 88 MCG tablet Take 1 tablet (88 mcg total) by mouth daily before breakfast.  . Omega-3 Fatty Acids (FISH OIL) 1000 MG CAPS Take 1 capsule by mouth every other day.   . polyethylene glycol (MIRALAX / GLYCOLAX) packet Take 17 g by mouth daily.   No facility-administered encounter medications on file as of 09/14/2016.     Review of Systems    Gen. she is not complaining of any fever or chills says she continues to feel well.   Skin does not complain of rashes or itching. Skin does have somewhat of a jaundiced appearance  Head ears eyes nose mouth and throat does not complaining of sore throat  Or visual changes  Respiratory is not complaining of shortness breath or cough.  Cardiac denies chest pain or palpitations does not appear to have significant lower extremity edema.  GI does not complain of any abdominal pain nausea vomiting diarrhea --had complained of constipation in the past but does not complain of this any longer  GU does not complain of dysuria.  Musculoskeletal does not complain of joint pain does have weakness lower extremities.  Neurologic is not complaining of headache or syncope-does have a history of vertigo in the past but is not currently complaining of this.  Psych does not complain of overt anxiety or depression appears to be in good spirits pleasant and interactive   Immunization History  Administered Date(s) Administered  . Influenza, High Dose Seasonal PF 10/16/2012, 11/03/2013, 11/02/2014, 10/26/2015  . Pneumococcal Conjugate-13 07/29/2014  . Pneumococcal Polysaccharide-23 11/14/2008  . Zoster 03/14/2008   Pertinent  Health Maintenance Due  Topic Date Due  . FOOT EXAM  10/11/2016 (Originally 07/10/2016)  . OPHTHALMOLOGY EXAM  10/11/2016 (Originally 06/22/2016)  . DEXA SCAN  10/11/2016  (Originally 07/15/2016)  . INFLUENZA VACCINE  01/05/2017 (Originally 09/05/2016)  . URINE MICROALBUMIN  10/25/2016  . HEMOGLOBIN A1C  02/03/2017  . PNA vac Low Risk Adult  Completed   Fall Risk  08/17/2016 05/07/2016 04/23/2016 11/22/2015 11/15/2015  Falls in the past year? Yes Yes Yes No No  Number falls in past yr: 1 1 1  - -  Injury with Fall? No Yes No - -  Comment - Bruised chin and face. Laceration on lip. - - -  Risk for fall due to : - - - - -   Functional Status Survey:    Vitals:   09/14/16 1609  BP: 129/68  Pulse: 71  Resp: 20  Temp: (!) 97.3 F (36.3 C)  TempSrc: Oral  Weight is stable at 117 pounds  Physical Exam   In general this is a pleasant somewhat thin elderly female in no distress. Sitting comfortably in her wheelchair  Her skin is warm and dry. She does appear to have some chronic old bruising sites diffuse Her skin does show evidence of jaundice  Eyes visual acuity appears grossly intact sclera and conjunctiva are clear.  Ears she is hard of hearing.  Chest is clear to auscultation there is no labored breathing breath sounds are slightly reduced.  Heart is regular rate and rhythm with a slight systolic murmur  she has trace lower extremity edema  Abdomen is soft somewhat protuberant nontender with positive bowel sounds.  Rectal -- did do a digital exam that was negative for occult bleeding  Musculoskeletal is able to move all extremities 4 strength appears to be    Neurologic is grossly intact her speech is clear no lateralizing findings.  Psych she is alert and oriented pleasant and appropriate   Labs reviewed:  Recent Labs  08/05/16 0621  09/12/16 0800 09/13/16 1036 09/14/16 0200  NA  --   < > 132* 132* 133*  K  --   < > 4.2 4.3 4.1  CL  --   < > 99* 98* 102  CO2  --   < > 23 26 25   GLUCOSE  --   < > 196* 257* 168*  BUN  --   < > 45* 44* 41*  CREATININE  --   < > 1.50* 1.53* 1.38*  CALCIUM  --   < > 8.3* 8.5* 8.0*  MG 1.7   --   --   --   --   < > = values in this interval not displayed.  Recent Labs  04/23/16 0912 09/04/16 1846 09/13/16 1036 09/14/16 0200  AST 18 57* 32  --   ALT 8 42 32  --   ALKPHOS 76 106 96  --   BILITOT 0.6 2.7* 2.8* 2.6*  PROT 6.9 6.0* 6.4*  --   ALBUMIN 4.0 2.5* 2.7*  --     Recent Labs  09/10/16 1000 09/12/16 0800 09/14/16 0200  WBC 6.8 6.3 4.7  NEUTROABS 5.1 4.4 2.8  HGB 10.2* 10.2* 8.8*  HCT 30.0* 29.6* 25.2*  MCV 91.7 91.4 89.7  PLT 95* 112* 125*   Lab Results  Component Value Date   TSH 1.986 08/04/2016   Lab Results  Component Value Date   HGBA1C 4.8 08/04/2016   Lab Results  Component Value Date   CHOL 129 04/23/2016   HDL 45 04/23/2016   LDLCALC 62 04/23/2016   TRIG 108 04/23/2016   CHOLHDL 2.9 04/23/2016    Significant Diagnostic Results in last 30 days:  Dg Chest 2 View  Result Date: 09/04/2016 CLINICAL DATA:  Fall EXAM: CHEST  2 VIEW COMPARISON:  08/04/2016 FINDINGS: Chronic lung disease with hyperinflation and prominent lung markings bilaterally. No focal consolidation. Negative for heart failure or effusion. No acute fracture is identified. IMPRESSION: Chronic lung disease. No acute abnormality. Improved aeration since the prior study. Electronically Signed   By: Marlan Palau M.D.   On: 09/04/2016 16:02   Ct Head Wo Contrast  Result Date: 09/04/2016 CLINICAL DATA:  Generalized weakness. Vertigo and dizziness. 2 falls in the past month. Weight loss. EXAM: CT HEAD WITHOUT CONTRAST TECHNIQUE: Contiguous axial images were obtained from the base of the skull through the vertex without intravenous contrast. COMPARISON:  None. FINDINGS: Brain: Diffusely enlarged ventricles and subarachnoid spaces. Patchy white matter low density in both cerebral hemispheres. No intracranial hemorrhage, mass lesion or CT evidence of acute infarction. Vascular: No hyperdense vessel or unexpected calcification. Skull: Mild bilateral hyperostosis frontalis.  Sinuses/Orbits: Bilateral lens implants. Unremarkable included paranasal sinuses. Other:  None. IMPRESSION: 1. No acute abnormality. 2. Mild to  moderate diffuse cerebral and cerebellar atrophy. 3. Moderate chronic small vessel white matter ischemic changes in both cerebral hemispheres. Electronically Signed   By: Beckie SaltsSteven  Reid M.D.   On: 09/04/2016 16:21    Assessment/Plan   #1 history of elevated bilirubin-she does show evidence of jaundice-clinically appears to be stable-and ultrasound of her abdomen has been scheduled for Monday, August 13-at this point will continue to monitor clinically she appears to be stable-again will await ultrasound results.    #2-history of anemia hemoglobin has dropped some this was discussed with Dr. Chales AbrahamsGupta at this point recommendation was to maintain  Eliquis--the occult blood testing today done by me was negative-but will continue to guaiac stools 3 and monitor h--so will obtain a GI consult this was discussed with Dr. Chales AbrahamsGupta via phone.  Will recheck a CBC on Monday, August 13.  #3 history of atrial fibrillation this appears rate controlled on current dose of Coreg at this point continue  Eliquis--but hemoglobin and clinical status will have to be watched closely.  #4-history of chronic kidney disease this appears to have stabilized with creatinine of 1.38 again will update this early next week as well.  #5 history of significant hypokalemia potassium was aggressively supplemented and quickly normalized it remains normal at 4..1 today this will need to be rechecked as well on Monday, August 13.  #6 history of diastolic CHF or weight appears to be stable clinical status at this point appears to be stable continues on a beta blocker as well as hydralazine.  Again she will need a CBC and basic metabolic panel Monday, August 13 and ultrasound is pending on Monday as well-will need a GI consult and stools guaiaced 3.  [id number]-of note greater than 35 minutes spent  assessing patient-discussing her status with nursing staff-reviewing her chart-reviewing her labs-and coordinating a plan of care with extensive input from Dr. Chales AbrahamsGupta via phone.  Of note greater than 50% of time spent coordinating plan of care with input as noted above   Addendum-09/16/2016-I do note patient has been on Lipitor and apparently has been on this long-term and noted usually Lipitor will affect the AST and ALT if there are changes--  but have spoken to nursing staff and will hold Lipitor  and await Dr. Urban GibsonGupta's input as well

## 2016-09-16 DIAGNOSIS — R17 Unspecified jaundice: Secondary | ICD-10-CM | POA: Insufficient documentation

## 2016-09-17 ENCOUNTER — Encounter (HOSPITAL_COMMUNITY)
Admission: RE | Admit: 2016-09-17 | Discharge: 2016-09-17 | Disposition: A | Discharge status: Home / Self Care | Payer: Medicare Other | Source: Skilled Nursing Facility | Attending: *Deleted | Admitting: *Deleted

## 2016-09-17 ENCOUNTER — Telehealth: Payer: Self-pay | Admitting: Nurse Practitioner

## 2016-09-17 ENCOUNTER — Encounter: Payer: Self-pay | Admitting: Cardiology

## 2016-09-17 ENCOUNTER — Observation Stay (HOSPITAL_COMMUNITY)
Admission: AD | Admit: 2016-09-17 | Discharge: 2016-09-17 | Disposition: A | Discharge status: Home / Self Care | Payer: Medicare Other | Source: Skilled Nursing Facility | Attending: Internal Medicine | Admitting: Internal Medicine

## 2016-09-17 LAB — CBC WITH DIFFERENTIAL/PLATELET
BASOS ABS: 0.1 10*3/uL (ref 0.0–0.1)
Basophils Relative: 1 %
Eosinophils Absolute: 0.2 10*3/uL (ref 0.0–0.7)
Eosinophils Relative: 4 %
HCT: 28.2 % — ABNORMAL LOW (ref 36.0–46.0)
HEMOGLOBIN: 9.7 g/dL — AB (ref 12.0–15.0)
LYMPHS PCT: 26 %
Lymphs Abs: 1.2 10*3/uL (ref 0.7–4.0)
MCH: 31.3 pg (ref 26.0–34.0)
MCHC: 34.4 g/dL (ref 30.0–36.0)
MCV: 91 fL (ref 78.0–100.0)
Monocytes Absolute: 0.4 10*3/uL (ref 0.1–1.0)
Monocytes Relative: 9 %
NEUTROS ABS: 2.9 10*3/uL (ref 1.7–7.7)
NEUTROS PCT: 60 %
PLATELETS: 173 10*3/uL (ref 150–400)
RBC: 3.1 MIL/uL — ABNORMAL LOW (ref 3.87–5.11)
RDW: 19.3 % — ABNORMAL HIGH (ref 11.5–15.5)
WBC: 4.7 10*3/uL (ref 4.0–10.5)

## 2016-09-17 LAB — OCCULT BLOOD X 1 CARD TO LAB, STOOL: FECAL OCCULT BLD: NEGATIVE

## 2016-09-17 LAB — BASIC METABOLIC PANEL
ANION GAP: 7 (ref 5–15)
BUN: 28 mg/dL — AB (ref 6–20)
CHLORIDE: 101 mmol/L (ref 101–111)
CO2: 26 mmol/L (ref 22–32)
Calcium: 8.1 mg/dL — ABNORMAL LOW (ref 8.9–10.3)
Creatinine, Ser: 1.34 mg/dL — ABNORMAL HIGH (ref 0.44–1.00)
GFR calc Af Amer: 42 mL/min — ABNORMAL LOW (ref >60)
GFR, EST NON AFRICAN AMERICAN: 37 mL/min — AB (ref >60)
Glucose, Bld: 122 mg/dL — ABNORMAL HIGH (ref 65–99)
Potassium: 4 mmol/L (ref 3.5–5.1)
Sodium: 134 mmol/L — ABNORMAL LOW (ref 135–145)

## 2016-09-17 NOTE — Telephone Encounter (Signed)
Do we get any paperwork from them?

## 2016-09-18 ENCOUNTER — Non-Acute Institutional Stay (SKILLED_NURSING_FACILITY): Payer: Medicare Other | Admitting: Internal Medicine

## 2016-09-18 ENCOUNTER — Inpatient Hospital Stay (HOSPITAL_COMMUNITY): Admission: RE | Admit: 2016-09-18 | Payer: Self-pay | Source: Skilled Nursing Facility

## 2016-09-18 ENCOUNTER — Encounter: Payer: Self-pay | Admitting: Internal Medicine

## 2016-09-18 DIAGNOSIS — M21371 Foot drop, right foot: Secondary | ICD-10-CM | POA: Diagnosis not present

## 2016-09-18 DIAGNOSIS — I5042 Chronic combined systolic (congestive) and diastolic (congestive) heart failure: Secondary | ICD-10-CM

## 2016-09-18 DIAGNOSIS — I480 Paroxysmal atrial fibrillation: Secondary | ICD-10-CM

## 2016-09-18 DIAGNOSIS — E030 Congenital hypothyroidism with diffuse goiter: Secondary | ICD-10-CM | POA: Diagnosis not present

## 2016-09-18 NOTE — Progress Notes (Signed)
Location:   [facility] Room Number: 132/P Place of Service:  SNF (31) Provider:  Marlow Baars, Mary-Margaret, FNP  Patient Care Team: Bennie Pierini, FNP as PCP - General (Nurse Practitioner) Derryl Harbor, OD as Consulting Physician [facility])  Extended Emergency Contact Information Primary Emergency Contact: Gibson,Reba Address: [address] [place], [place] [address] [place] of [place] Home Phone: [phone] Work Phone: [phone] Relation: Sister  Code Status:  Full Code Goals of care: Advanced Directive information Advanced Directives 09/18/2016  Does Patient Have a Medical Advance Directive? Yes  Type of Advance Directive (No Data)  Does patient want to make changes to medical advance directive? No - Patient declined  Copy of Healthcare Power of Attorney in Chart? -  Would patient like information on creating a medical advance directive? No - Patient declined     Chief Complaint  Patient presents with  . Acute Visit    Foot Drop    HPI:  Pt is a 81 y.o. female seen today for an acute visit for Foot Drop as noticed by the Therapy. Patient has h/o Hypertension, Type2 Diabetes, Hyperlipidemia, hypothyroidism, recently quit smoking, CKD, newly diagnosed Heart Failure with EF of 35-40 %. She was admitted to SNF for therapy after hospitalization for Symptomatic Anemia with Hgb of 6.5 and Acuter renal failure. She was treated With Transfusion and discharged to the facility for therapy. In the facility patient developed new onset Atrial Fibrillations and was started on Coreg for rate control and Eliquis.   Patient was noticed by the Therapist with new onset Foot drop while she was in therapy. . She has no pain or injury or swelling in that leg. Patient denies any fever or chills. She is having difficulty in walking since her foot drop. Denies any Tingling ar loss of sensation in that leg     Past Medical  History:  Diagnosis Date  . Allergy   . Cataract   . Diabetes mellitus without complication (HCC)   . Hyperlipidemia   . Hypertension   . Thyroid disease    Past Surgical History:  Procedure Laterality Date  . ABDOMINAL HYSTERECTOMY     partial but later ovaries removed  . EYE SURGERY Bilateral    cataract  . OOPHORECTOMY Bilateral     No Known Allergies  Outpatient Encounter Prescriptions as of 09/18/2016  Medication Sig  . apixaban (ELIQUIS) 2.5 MG TABS tablet Take 2.5 mg by mouth 2 (two) times daily.  . bisacodyl (DULCOLAX) 5 MG EC tablet Take 2 tablets (10 mg total) by mouth daily as needed for mild constipation or moderate constipation.  . calcium carbonate (OS-CAL) 600 MG TABS tablet Take 600 mg by mouth every other day.   . carvedilol (COREG) 12.5 MG tablet Take 12.5 mg by mouth 2 (two) times daily with a meal. 1 bid  . docusate sodium (COLACE) 100 MG capsule Take 2 capsules (200 mg total) by mouth 2 (two) times daily.  . hydrALAZINE (APRESOLINE) 50 MG tablet Take 1 tablet (50 mg total) by mouth every 8 (eight) hours.  . isosorbide mononitrate (IMDUR) 30 MG 24 hr tablet Take 1 tablet (30 mg total) by mouth daily.  Marland Kitchen levothyroxine (SYNTHROID, LEVOTHROID) 88 MCG tablet Take 1 tablet (88 mcg total) by mouth daily before breakfast.  . Omega-3 Fatty Acids (FISH OIL) 1000 MG CAPS Take 1 capsule by mouth every other day.   . polyethylene glycol (MIRALAX /  GLYCOLAX) packet Take 17 g by mouth daily.  . [DISCONTINUED] atorvastatin (LIPITOR) 80 MG tablet Take 1 tablet (80 mg total) by mouth daily at 6 PM.   No facility-administered encounter medications on file as of 09/18/2016.      Review of Systems  Review of Systems  Constitutional: Negative for activity change, appetite change, chills, diaphoresis, fatigue and fever.  HENT: Negative for mouth sores, postnasal drip, rhinorrhea, sinus pain and sore throat.   Respiratory: Negative for apnea, cough, chest tightness, shortness of  breath and wheezing.   Cardiovascular: Negative for chest pain, palpitations and leg swelling.  Gastrointestinal: Negative for abdominal distention, abdominal pain, constipation, diarrhea, nausea and vomiting.  Genitourinary: Negative for dysuria and frequency.  Musculoskeletal: Negative for arthralgias, joint swelling and myalgias.  Skin: Negative for rash.  Neurological: Negative for dizziness, syncope, weakness, light-headedness and numbness.  Psychiatric/Behavioral: Negative for behavioral problems, confusion and sleep disturbance.     Immunization History  Administered Date(s) Administered  . Influenza, High Dose Seasonal PF 10/16/2012, 11/03/2013, 11/02/2014, 10/26/2015  . Pneumococcal Conjugate-13 07/29/2014  . Pneumococcal Polysaccharide-23 11/14/2008  . Zoster 03/14/2008   Pertinent  Health Maintenance Due  Topic Date Due  . FOOT EXAM  10/11/2016 (Originally 07/10/2016)  . OPHTHALMOLOGY EXAM  10/11/2016 (Originally 06/22/2016)  . DEXA SCAN  10/11/2016 (Originally 07/15/2016)  . INFLUENZA VACCINE  01/05/2017 (Originally 09/05/2016)  . URINE MICROALBUMIN  10/25/2016  . HEMOGLOBIN A1C  02/03/2017  . PNA vac Low Risk Adult  Completed   Fall Risk  08/17/2016 05/07/2016 04/23/2016 11/22/2015 11/15/2015  Falls in the past year? Yes Yes Yes No No  Number falls in past yr: 1 1 1  - -  Injury with Fall? No Yes No - -  Comment - Bruised chin and face. Laceration on lip. - - -  Risk for fall due to : - - - - -   Functional Status Survey:    Vitals:   09/18/16 0916  BP: 135/71  Pulse: 77  Resp: 20  Temp: 97.8 F (36.6 C)  TempSrc: Oral   There is no height or weight on file to calculate BMI. Physical Exam  Constitutional: She is oriented to person, place, and time. She appears well-developed.  HENT:  Head: Normocephalic.  Mouth/Throat: Oropharynx is clear and moist.  Eyes: Pupils are equal, round, and reactive to light.  Neck: Neck supple.  Cardiovascular: Normal rate and  normal heart sounds.  An irregularly irregular rhythm present.  Pulmonary/Chest: Effort normal. No respiratory distress. She has no wheezes. She has no rales.  Abdominal: Soft. Bowel sounds are normal. She exhibits no distension. There is no tenderness. There is no rebound.  Musculoskeletal:  Trace edema Bilateral  Neurological: She is alert and oriented to person, place, and time.  Pupils are equal, round and reactive to light and accommodation; extra ocular movements are full, there is no significant nystagmus; visual fields are full; upper and lower facial muscles are normal.  Strength in UE 5/5 Bilateral Strength in LE 5/5 except has Foot drop in Right leg No Sensory loss    Labs reviewed:  Recent Labs  08/05/16 0621  09/13/16 1036 09/14/16 0200 09/17/16 0700  NA  --   < > 132* 133* 134*  K  --   < > 4.3 4.1 4.0  CL  --   < > 98* 102 101  CO2  --   < > 26 25 26   GLUCOSE  --   < > 257* 168* 122*  BUN  --   < > 44* 41* 28*  CREATININE  --   < > 1.53* 1.38* 1.34*  CALCIUM  --   < > 8.5* 8.0* 8.1*  MG 1.7  --   --   --   --   < > = values in this interval not displayed.  Recent Labs  04/23/16 0912 09/04/16 1846 09/13/16 1036 09/14/16 0200  AST 18 57* 32  --   ALT 8 42 32  --   ALKPHOS 76 106 96  --   BILITOT 0.6 2.7* 2.8* 2.6*  PROT 6.9 6.0* 6.4*  --   ALBUMIN 4.0 2.5* 2.7*  --     Recent Labs  09/12/16 0800 09/14/16 0200 09/17/16 0700  WBC 6.3 4.7 4.7  NEUTROABS 4.4 2.8 2.9  HGB 10.2* 8.8* 9.7*  HCT 29.6* 25.2* 28.2*  MCV 91.4 89.7 91.0  PLT 112* 125* 173   Lab Results  Component Value Date   TSH 1.986 08/04/2016   Lab Results  Component Value Date   HGBA1C 4.8 08/04/2016   Lab Results  Component Value Date   CHOL 129 04/23/2016   HDL 45 04/23/2016   LDLCALC 62 04/23/2016   TRIG 108 04/23/2016   CHOLHDL 2.9 04/23/2016    Significant Diagnostic Results in last 30 days:  Dg Chest 2 View  Result Date: 09/04/2016 CLINICAL DATA:  Fall EXAM:  CHEST  2 VIEW COMPARISON:  08/04/2016 FINDINGS: Chronic lung disease with hyperinflation and prominent lung markings bilaterally. No focal consolidation. Negative for heart failure or effusion. No acute fracture is identified. IMPRESSION: Chronic lung disease. No acute abnormality. Improved aeration since the prior study. Electronically Signed   By: Marlan Palau M.D.   On: 09/04/2016 16:02   Ct Head Wo Contrast  Result Date: 09/04/2016 CLINICAL DATA:  Generalized weakness. Vertigo and dizziness. 2 falls in the past month. Weight loss. EXAM: CT HEAD WITHOUT CONTRAST TECHNIQUE: Contiguous axial images were obtained from the base of the skull through the vertex without intravenous contrast. COMPARISON:  None. FINDINGS: Brain: Diffusely enlarged ventricles and subarachnoid spaces. Patchy white matter low density in both cerebral hemispheres. No intracranial hemorrhage, mass lesion or CT evidence of acute infarction. Vascular: No hyperdense vessel or unexpected calcification. Skull: Mild bilateral hyperostosis frontalis. Sinuses/Orbits: Bilateral lens implants. Unremarkable included paranasal sinuses. Other:  None. IMPRESSION: 1. No acute abnormality. 2. Mild to moderate diffuse cerebral and cerebellar atrophy. 3. Moderate chronic small vessel white matter ischemic changes in both cerebral hemispheres. Electronically Signed   By: Beckie Salts M.D.   On: 09/04/2016 16:21    Assessment/Plan  Foot Drop D/W therapist. This seems like new problem Will get Neurology Consult to evaluate patient. I have D/W the patient and therapy. They are saying she has to relearn as she is not able to walk as her right leg is lagging. With her rest of the Neuro exam normal I dont think she has had Stroke. Though patient is already on Eliquis for her A Fibrillation. Will wait for Neurology input.  New Onset Atrial Fibrillation Patient rate is controlled and she is tolerating Eliquis Has follow up with Cardiology H/O  Systolic CHF EF of 35-40 % On Hydralazine, Isosorbide and Coreg She is eu volemic Continue to follow daily weights.  Hypothyroid Continue on Synthroid TSH normal in 06/18   Anemia Her Hgb is stable. She does need GI work up. Consult Is pending.  Hyperbilirubinemia [place] of Liver pending.   Though her other Hepatic  panel was completely Normal.  ? Hemolysis can be the cause especially Anemia and Guaiac Negative.  Will repeat labs in few days to follow . Family/ staff Communication: Plan for discharge home  Labs/tests ordered:  BMP, CBC, Hepatic panel. Total time spent in this patient care encounter was 45_ minutes; greater than 50% of the visit spent counseling patient, reviewing records , Labs and coordinating care for problems addressed at this encounter. Including d/w the therapy.for discharge planning.

## 2016-09-18 NOTE — Telephone Encounter (Signed)
Phone call taken care of in different encounter. 

## 2016-09-18 NOTE — Progress Notes (Deleted)
Location:   [facility] Room Number: 132/P Place of Service:  SNF [phone]) Provider:  Tonette Bihari, Mary-Margaret, FNP  Patient Care Team: Bennie Pierini, FNP as PCP - General (Nurse Practitioner) Derryl Harbor, OD as Consulting Physician [facility])  Extended Emergency Contact Information Primary Emergency Contact: Gibson,Reba Address: [address] [place], [place] [address] [place] of [place] Home Phone: [phone] Work Phone: [phone] Relation: Sister  Code Status:  Full Code Goals of care: Advanced Directive information Advanced Directives 09/18/2016  Does Patient Have a Medical Advance Directive? Yes  Type of Advance Directive (No Data)  Does patient want to make changes to medical advance directive? No - Patient declined  Copy of Healthcare Power of Attorney in Chart? -  Would patient like information on creating a medical advance directive? No - Patient declined     Chief Complaint  Patient presents with  . Acute Visit    Foot Drop    HPI:  Pt is a 81 y.o. female seen today for an acute visit for    Past Medical History:  Diagnosis Date  . Allergy   . Cataract   . Diabetes mellitus without complication (HCC)   . Hyperlipidemia   . Hypertension   . Thyroid disease    Past Surgical History:  Procedure Laterality Date  . ABDOMINAL HYSTERECTOMY     partial but later ovaries removed  . EYE SURGERY Bilateral    cataract  . OOPHORECTOMY Bilateral     No Known Allergies  Outpatient Encounter Prescriptions as of 09/18/2016  Medication Sig  . apixaban (ELIQUIS) 2.5 MG TABS tablet Take 2.5 mg by mouth 2 (two) times daily.  . bisacodyl (DULCOLAX) 5 MG EC tablet Take 2 tablets (10 mg total) by mouth daily as needed for mild constipation or moderate constipation.  . calcium carbonate (OS-CAL) 600 MG TABS tablet Take 600 mg by mouth every other day.   . carvedilol (COREG) 12.5 MG tablet Take 12.5 mg by mouth  2 (two) times daily with a meal. 1 bid  . docusate sodium (COLACE) 100 MG capsule Take 2 capsules (200 mg total) by mouth 2 (two) times daily.  . hydrALAZINE (APRESOLINE) 50 MG tablet Take 1 tablet (50 mg total) by mouth every 8 (eight) hours.  . isosorbide mononitrate (IMDUR) 30 MG 24 hr tablet Take 1 tablet (30 mg total) by mouth daily.  Marland Kitchen levothyroxine (SYNTHROID, LEVOTHROID) 88 MCG tablet Take 1 tablet (88 mcg total) by mouth daily before breakfast.  . Omega-3 Fatty Acids (FISH OIL) 1000 MG CAPS Take 1 capsule by mouth every other day.   . polyethylene glycol (MIRALAX / GLYCOLAX) packet Take 17 g by mouth daily.  . [DISCONTINUED] atorvastatin (LIPITOR) 80 MG tablet Take 1 tablet (80 mg total) by mouth daily at 6 PM.   No facility-administered encounter medications on file as of 09/18/2016.     Review of Systems  Immunization History  Administered Date(s) Administered  . Influenza, High Dose Seasonal PF 10/16/2012, 11/03/2013, 11/02/2014, 10/26/2015  . Pneumococcal Conjugate-13 07/29/2014  . Pneumococcal Polysaccharide-23 11/14/2008  . Zoster 03/14/2008   Pertinent  Health Maintenance Due  Topic Date Due  . FOOT EXAM  10/11/2016 (Originally 07/10/2016)  . OPHTHALMOLOGY EXAM  10/11/2016 (Originally 06/22/2016)  . DEXA SCAN  10/11/2016 (Originally 07/15/2016)  . INFLUENZA VACCINE  01/05/2017 (Originally 09/05/2016)  . URINE MICROALBUMIN  10/25/2016  . HEMOGLOBIN A1C  02/03/2017  .  PNA vac Low Risk Adult  Completed   Fall Risk  08/17/2016 05/07/2016 04/23/2016 11/22/2015 11/15/2015  Falls in the past year? Yes Yes Yes No No  Number falls in past yr: 1 1 1  - -  Injury with Fall? No Yes No - -  Comment - Bruised chin and face. Laceration on lip. - - -  Risk for fall due to : - - - - -   Functional Status Survey:    Vitals:   09/18/16 0916  BP: 135/71  Pulse: 77  Resp: 20  Temp: 97.8 F (36.6 C)  TempSrc: Oral   There is no height or weight on file to calculate BMI. Physical  Exam  Labs reviewed:  Recent Labs  08/05/16 0621  09/13/16 1036 09/14/16 0200 09/17/16 0700  NA  --   < > 132* 133* 134*  K  --   < > 4.3 4.1 4.0  CL  --   < > 98* 102 101  CO2  --   < > 26 25 26   GLUCOSE  --   < > 257* 168* 122*  BUN  --   < > 44* 41* 28*  CREATININE  --   < > 1.53* 1.38* 1.34*  CALCIUM  --   < > 8.5* 8.0* 8.1*  MG 1.7  --   --   --   --   < > = values in this interval not displayed.  Recent Labs  04/23/16 0912 09/04/16 1846 09/13/16 1036 09/14/16 0200  AST 18 57* 32  --   ALT 8 42 32  --   ALKPHOS 76 106 96  --   BILITOT 0.6 2.7* 2.8* 2.6*  PROT 6.9 6.0* 6.4*  --   ALBUMIN 4.0 2.5* 2.7*  --     Recent Labs  09/12/16 0800 09/14/16 0200 09/17/16 0700  WBC 6.3 4.7 4.7  NEUTROABS 4.4 2.8 2.9  HGB 10.2* 8.8* 9.7*  HCT 29.6* 25.2* 28.2*  MCV 91.4 89.7 91.0  PLT 112* 125* 173   Lab Results  Component Value Date   TSH 1.986 08/04/2016   Lab Results  Component Value Date   HGBA1C 4.8 08/04/2016   Lab Results  Component Value Date   CHOL 129 04/23/2016   HDL 45 04/23/2016   LDLCALC 62 04/23/2016   TRIG 108 04/23/2016   CHOLHDL 2.9 04/23/2016    Significant Diagnostic Results in last 30 days:  Dg Chest 2 View  Result Date: 09/04/2016 CLINICAL DATA:  Fall EXAM: CHEST  2 VIEW COMPARISON:  08/04/2016 FINDINGS: Chronic lung disease with hyperinflation and prominent lung markings bilaterally. No focal consolidation. Negative for heart failure or effusion. No acute fracture is identified. IMPRESSION: Chronic lung disease. No acute abnormality. Improved aeration since the prior study. Electronically Signed   By: Marlan Palau M.D.   On: 09/04/2016 16:02   Ct Head Wo Contrast  Result Date: 09/04/2016 CLINICAL DATA:  Generalized weakness. Vertigo and dizziness. 2 falls in the past month. Weight loss. EXAM: CT HEAD WITHOUT CONTRAST TECHNIQUE: Contiguous axial images were obtained from the base of the skull through the vertex without intravenous  contrast. COMPARISON:  None. FINDINGS: Brain: Diffusely enlarged ventricles and subarachnoid spaces. Patchy white matter low density in both cerebral hemispheres. No intracranial hemorrhage, mass lesion or CT evidence of acute infarction. Vascular: No hyperdense vessel or unexpected calcification. Skull: Mild bilateral hyperostosis frontalis. Sinuses/Orbits: Bilateral lens implants. Unremarkable included paranasal sinuses. Other:  None. IMPRESSION: 1. No acute abnormality.  2. Mild to moderate diffuse cerebral and cerebellar atrophy. 3. Moderate chronic small vessel white matter ischemic changes in both cerebral hemispheres. Electronically Signed   By: Beckie SaltsSteven  Reid M.D.   On: 09/04/2016 16:21    Assessment/Plan There are no diagnoses linked to this encounter.      London SheerLuster, Sally C, [place]MA [phone]

## 2016-09-18 NOTE — Telephone Encounter (Signed)
Pt sister notified [facility]

## 2016-09-18 NOTE — Telephone Encounter (Signed)
No I do not get stuff from there

## 2016-09-19 ENCOUNTER — Inpatient Hospital Stay
Admission: RE | Admit: 2016-09-19 | Discharge: 2016-10-27 | Disposition: A | Discharge status: Home / Self Care | Payer: Medicare Other | Source: Ambulatory Visit | Attending: Internal Medicine | Admitting: Internal Medicine

## 2016-09-19 ENCOUNTER — Ambulatory Visit (HOSPITAL_COMMUNITY)
Admission: AD | Admit: 2016-09-19 | Discharge: 2016-09-19 | Disposition: A | Discharge status: Home / Self Care | Payer: Medicare Other | Source: Skilled Nursing Facility | Attending: Internal Medicine | Admitting: Internal Medicine

## 2016-09-19 ENCOUNTER — Encounter (HOSPITAL_COMMUNITY): Payer: Self-pay

## 2016-09-19 ENCOUNTER — Encounter: Payer: Self-pay | Admitting: Internal Medicine

## 2016-09-19 ENCOUNTER — Emergency Department (HOSPITAL_COMMUNITY): Payer: Medicare Other

## 2016-09-19 ENCOUNTER — Emergency Department (HOSPITAL_COMMUNITY)
Admission: EM | Admit: 2016-09-19 | Discharge: 2016-09-19 | Disposition: A | Discharge status: Home / Self Care | Payer: Medicare Other | Attending: Emergency Medicine | Admitting: Emergency Medicine

## 2016-09-19 ENCOUNTER — Non-Acute Institutional Stay (SKILLED_NURSING_FACILITY): Payer: Medicare Other | Admitting: Internal Medicine

## 2016-09-19 DIAGNOSIS — Z79899 Other long term (current) drug therapy: Secondary | ICD-10-CM | POA: Insufficient documentation

## 2016-09-19 DIAGNOSIS — M21371 Foot drop, right foot: Principal | ICD-10-CM

## 2016-09-19 DIAGNOSIS — G959 Disease of spinal cord, unspecified: Secondary | ICD-10-CM

## 2016-09-19 DIAGNOSIS — Z87891 Personal history of nicotine dependence: Secondary | ICD-10-CM | POA: Diagnosis not present

## 2016-09-19 DIAGNOSIS — I5042 Chronic combined systolic (congestive) and diastolic (congestive) heart failure: Secondary | ICD-10-CM | POA: Insufficient documentation

## 2016-09-19 DIAGNOSIS — E039 Hypothyroidism, unspecified: Secondary | ICD-10-CM | POA: Insufficient documentation

## 2016-09-19 DIAGNOSIS — I130 Hypertensive heart and chronic kidney disease with heart failure and stage 1 through stage 4 chronic kidney disease, or unspecified chronic kidney disease: Secondary | ICD-10-CM | POA: Diagnosis not present

## 2016-09-19 DIAGNOSIS — I714 Abdominal aortic aneurysm, without rupture, unspecified: Secondary | ICD-10-CM

## 2016-09-19 DIAGNOSIS — I480 Paroxysmal atrial fibrillation: Secondary | ICD-10-CM

## 2016-09-19 DIAGNOSIS — N183 Chronic kidney disease, stage 3 unspecified: Secondary | ICD-10-CM

## 2016-09-19 DIAGNOSIS — Z7901 Long term (current) use of anticoagulants: Secondary | ICD-10-CM | POA: Diagnosis not present

## 2016-09-19 DIAGNOSIS — R17 Unspecified jaundice: Secondary | ICD-10-CM

## 2016-09-19 DIAGNOSIS — E1122 Type 2 diabetes mellitus with diabetic chronic kidney disease: Secondary | ICD-10-CM | POA: Diagnosis not present

## 2016-09-19 DIAGNOSIS — M5410 Radiculopathy, site unspecified: Secondary | ICD-10-CM

## 2016-09-19 HISTORY — DX: Disorder of kidney and ureter, unspecified: N28.9

## 2016-09-19 HISTORY — DX: Muscle weakness (generalized): M62.81

## 2016-09-19 HISTORY — DX: Chronic combined systolic (congestive) and diastolic (congestive) heart failure: I50.42

## 2016-09-19 HISTORY — DX: Dysphagia, unspecified: R13.10

## 2016-09-19 HISTORY — DX: Anemia, unspecified: D64.9

## 2016-09-19 NOTE — ED Notes (Signed)
Pt returned from ct. nad 

## 2016-09-19 NOTE — Telephone Encounter (Signed)
Aware of message from Sheron NightingaleM. Martin per details left on voice mail .

## 2016-09-19 NOTE — ED Provider Notes (Signed)
[facility]-EMERGENCY DEPT Provider Note   CSN: [id number] Arrival date & time: 09/19/16  1053     History   Chief Complaint Chief Complaint  Patient presents with  . AAA    HPI Michele Walters is a 81 y.o. female.  HPI Patient sent in for incidental finding of AAA on abdominal ultrasound. Sent from nursing home. Ultrasound done to evaluate for liver abnormalities. Has had no abdominal pain. She is on anticoagulation however. Ultrasound report reviewed. No back or abdominal pain. No known history of AAA. Past Medical History:  Diagnosis Date  . Allergy   . Anemia    iron def  . Cataract   . Chronic combined systolic (congestive) and diastolic (congestive) heart failure (HCC)   . Diabetes mellitus without complication (HCC)   . Dysphagia   . Hyperlipidemia   . Hypertension   . Muscle weakness   . Renal disorder    chroninc kidney disease  . Thyroid disease     Patient Active Problem List   Diagnosis Date Noted  . Elevated bilirubin 09/16/2016  . Abnormal nuclear stress test 09/12/2016  . Paroxysmal atrial fibrillation (HCC) 09/10/2016  . Benign positional vertigo 09/10/2016  . Normocytic anemia 09/04/2016  . Elevated troponin 09/04/2016  . Muscular deconditioning 09/04/2016  . Chronic combined systolic (congestive) and diastolic (congestive) heart failure (HCC) 09/04/2016  . Chest pain   . LBBB (left bundle branch block)   . Acute hypercapnic respiratory failure (HCC) 08/04/2016  . Acute respiratory failure with hypoxia (HCC)   . Smoker 11/23/2014  . CKD (chronic kidney disease) stage 3, GFR 30-59 ml/min 10/20/2014  . DM2 (diabetes mellitus, type 2) (HCC) 10/15/2012  . Hypertensive heart disease with CHF (congestive heart failure) (HCC) 05/06/2010  . Hypothyroidism 05/06/2010  . Hyperlipidemia 05/06/2010    Past Surgical History:  Procedure Laterality Date  . ABDOMINAL HYSTERECTOMY     partial but later ovaries removed  . EYE SURGERY Bilateral    cataract  .  OOPHORECTOMY Bilateral     OB History    No data available       Home Medications    Prior to Admission medications   Medication Sig Start Date End Date Taking? Authorizing Provider  apixaban (ELIQUIS) 2.5 MG TABS tablet Take 2.5 mg by mouth 2 (two) times daily.    [provider]  bisacodyl (DULCOLAX) 5 MG EC tablet Take 2 tablets (10 mg total) by mouth daily as needed for mild constipation or moderate constipation. 09/08/16   Houston Siren, MD  calcium carbonate (OS-CAL) 600 MG TABS tablet Take 600 mg by mouth every other day.     [provider]  carvedilol (COREG) 12.5 MG tablet Take 12.5 mg by mouth 2 (two) times daily with a meal. 1 bid 09/10/16   Pecola Lawless, MD  docusate sodium (COLACE) 100 MG capsule Take 2 capsules (200 mg total) by mouth 2 (two) times daily. 08/08/16   Noralee Stain Chahn-Yang, DO  hydrALAZINE (APRESOLINE) 50 MG tablet Take 1 tablet (50 mg total) by mouth every 8 (eight) hours. 08/08/16   Noralee Stain Chahn-Yang, DO  isosorbide mononitrate (IMDUR) 30 MG 24 hr tablet Take 1 tablet (30 mg total) by mouth daily. 08/09/16   Noralee Stain Chahn-Yang, DO  levothyroxine (SYNTHROID, LEVOTHROID) 88 MCG tablet Take 1 tablet (88 mcg total) by mouth daily before breakfast. 04/23/16   Daphine Deutscher Mary-Margaret, FNP  Omega-3 Fatty Acids (FISH OIL) 1000 MG CAPS Take 1 capsule by mouth every other  day.     [provider]  polyethylene glycol (MIRALAX / GLYCOLAX) packet Take 17 g by mouth daily.    [provider]    Family History Family History  Problem Relation Age of Onset  . Heart disease Mother   . Heart attack Mother        massive  . COPD Father   . Emphysema Father   . Down syndrome Brother     Social History Social History  Substance Use Topics  . Smoking status: Former Smoker    Packs/day: 1.00    Years: 55.00    Types: Cigarettes    Quit date: 08/03/2016  . Smokeless tobacco: Never Used  . Alcohol use No     Allergies    Patient has no known allergies.   Review of Systems Review of Systems  Constitutional: Negative for appetite change.  Respiratory: Positive for shortness of breath.   Cardiovascular: Negative for chest pain.  Gastrointestinal: Negative for abdominal pain.  Genitourinary: Negative for flank pain.  Musculoskeletal: Negative for arthralgias.  Neurological: Negative for syncope.  Psychiatric/Behavioral: Negative for confusion.     Physical Exam Updated Vital Signs BP (!) 142/78   Pulse 65   Temp 97.8 F (36.6 C) (Oral)   Resp 18   Ht 5\' 2"  (1.575 m)   Wt 52.2 kg (115 lb)   SpO2 97%   BMI 21.03 kg/m   Physical Exam  Constitutional: She appears well-developed.  Pulmonary/Chest: She has no wheezes. She has no rales.  Abdominal: There is no tenderness. There is no rebound.  Musculoskeletal: She exhibits no edema.  Neurological: She is alert.  Skin: Skin is warm. Capillary refill takes less than 2 seconds.     ED Treatments / Results  Labs (all labs ordered are listed, but only abnormal results are displayed) Labs Reviewed - No data to display  EKG  EKG Interpretation None       Radiology Ct Abdomen Pelvis Wo Contrast  Result Date: 09/19/2016 CLINICAL DATA:  Abdominal aortic aneurysm. EXAM: CT ABDOMEN AND PELVIS WITHOUT CONTRAST TECHNIQUE: Multidetector CT imaging of the abdomen and pelvis was performed following the standard protocol without IV contrast. COMPARISON:  None FINDINGS: Lower chest: Calcified granuloma identified in the right base. Bilateral lower lobe interstitial reticulation and mild bronchiectasis is identified. No frank honeycombing. Hepatobiliary: No focal liver abnormality identified. Gallbladder appears normal. Pancreas: Unremarkable. No pancreatic ductal dilatation or surrounding inflammatory changes. Spleen: Normal in size without focal abnormality. Adrenals/Urinary Tract: The adrenal glands are normal. There are bilateral renal calcifications.  Most of these are likely vascular in nature. No hydronephrosis. Urinary bladder appears normal. Stomach/Bowel: The stomach is normal. The small bowel loops have a normal course and caliber. No bowel obstruction. The colon is unremarkable. Vascular/Lymphatic: Aortic atherosclerosis. There is an infrarenal abdominal aortic aneurysm measuring 5.5 cm in AP dimension, image 69 of series 6. No enlarged retroperitoneal or mesenteric adenopathy. No enlarged pelvic or inguinal lymph nodes. Reproductive: Status post hysterectomy. No adnexal masses. Other: No abdominal wall hernia or abnormality. No abdominopelvic ascites. Musculoskeletal: There is a scoliosis deformity involving the lumbar spine which is convex to the left. Degenerative disc disease is identified within the lumbar spine. No aggressive lytic or sclerotic bone lesions. IMPRESSION: 1. No acute findings within the abdomen or pelvis. 2. Aortic aneurysm NOS (ICD10-I71.9). Recommend followup by abdomen and pelvis CTA in 3-6 months, and vascular surgery referral/consultation if not already obtained. This recommendation follows ACR consensus guidelines: White  Paper of the ACR Incidental Findings Committee II on Vascular Findings. J Am Coll Radiol 2013; 10:789-794. 3. Chronic interstitial changes are identified in both lung bases including interstitial reticulation and bronchiectasis. Findings may reflect early usual interstitial pneumonia or nonspecific interstitial pneumonia. A followup high-resolution CT of the chest in 6 months is advised to assess for any temporal change. Electronically Signed   By: Signa Kell M.D.   On: 09/19/2016 13:01   US Abdomen Complete  Result Date: 09/19/2016 CLINICAL DATA:  Increased bilirubin, history hypertension, diabetes mellitus EXAM: ABDOMEN ULTRASOUND COMPLETE COMPARISON:  08/06/2016 renal ultrasound FINDINGS: Gallbladder: 2.2 cm diameter mildly hyperechoic nonshadowing focus within the gallbladder lumen likely a sludge  ball. No discrete shadowing calculi identified. No gallbladder wall thickening, pericholecystic fluid or sonographic Murphy sign. Common bile duct: Diameter: 4 mm diameter, normal Liver: Normal appearance.  Hepatopetal portal venous flow. IVC: Normal appearance Pancreas: Normal appearance Spleen: 9.9 cm length. Slightly rounded appearance without focal mass Right Kidney: Length: 9.8 cm. Borderline increased echogenicity without mass or hydronephrosis Left Kidney: Length: 10.2 cm. Minimally increased cortical echogenicity without mass or hydronephrosis Abdominal aorta: Aneurysmal dilatation of the mid to distal abdominal aorta measuring up to 5.0 cm AP at the mid aorta and 5.3 cm AP at the distal aorta. Transverse dimension is less well demonstrated but potentially 6 cm. Other findings: No free fluid IMPRESSION: Probable sludge ball within gallbladder without definite evidence of cholelithiasis or acute cholecystitis. Aneurysmal dilatation of the mid to distal abdominal aorta measuring 5.0 cm AP at mid aorta and 5.3 cm distally, with transverse dimensions potentially in excess of 6 cm. Vascular surgery consultation recommended due to increased risk of rupture for AAA >5.5 cm. This recommendation follows ACR consensus guidelines: White Paper of the ACR Incidental Findings Committee II on Vascular Findings. J Am Coll Radiol 2013; 10:789-794. Findings called to Vicky RN at [facility] on 09/19/2016 at 0948 hrs. Electronically Signed   By: Ulyses Southward M.D.   On: 09/19/2016 09:53    Procedures Procedures (including critical care time)  Medications Ordered in ED Medications - No data to display   Initial Impression / Assessment and Plan / ED Course  I have reviewed the triage vital signs and the nursing notes.  Pertinent labs & imaging results that were available during my care of the patient were reviewed by me and considered in my medical decision making (see chart for details).     Patient with AAA found  incidentally on ultrasound and sent in to ER. Discussed with Dr. Myra Gianotti he recommended noncontrast CT for further evaluation. He was done and showed diameter around 5.5 cm. Discussed with him again. Will see in follow-up. No bleeding. It is an incidental finding and she is not symptomatic with it.  Final Clinical Impressions(s) / ED Diagnoses   Final diagnoses:  Abdominal aortic aneurysm (AAA) without rupture [facility])    New Prescriptions Discharge Medication List as of 09/19/2016  1:23 PM       Benjiman Core, MD 09/19/16 [phone]

## 2016-09-19 NOTE — ED Notes (Signed)
Patient transported to CT 

## 2016-09-19 NOTE — ED Triage Notes (Signed)
[facility] >5.5 cm. Pt reports she has ultrasound of abdomen this am. Denies pain

## 2016-09-19 NOTE — Progress Notes (Signed)
Location:   [facility] Room Number: 132/P Place of Service:  SNF [phone]) Provider:  Tonette BihariArlo Yvette Roark  Martin, Mary-Margaret, FNP  Patient Care Team: Bennie PieriniMartin, Mary-Margaret, FNP as PCP - General (Nurse Practitioner) Derryl Harborran, Truc Ly, OD as Consulting Physician [facility](Optometry)  Extended Emergency Contact Information Primary Emergency Contact: Gibson,Reba Address: [address]          [place], [place] [address] [place] Home Phone: [phone] Work Phone: [phone] Relation: Sister  Code Status:  Full Code Goals of care: Advanced Directive information Advanced Directives 09/19/2016  Does Patient Have a Medical Advance Directive? Yes  Type of Advance Directive (No Data)  Does patient want to make changes to medical advance directive? No - Patient declined  Copy of Healthcare Power of Attorney in Chart? -  Would patient like information on creating a medical advance directive? No - Patient declined     Chief Complaint  Patient presents with  . Acute Visit    Aneurysm    HPI:  Pt is a 81 y.o. female seen today for an acute visit for Follow-up abdominal aorta general isn't found incidentally on an ultrasound earlier today and on subsequent CT scan.  Patient at the ultrasound secondary to an elevated bilirubin and some clinical evidence of jaundice-there were concerns about liver or pancreatic issues-although her liver function tests have been within normal range.  The ultrasound had an incidental finding of an aortic abdominal aneurysm with dilation of the mid to distal abdominal aorta measuring 5 cm AP Mid aorta and 5.3 cm distally with transverse dimensions pulse eventually in excess of 6 cm. bham Subsequently patient was sent to the ER for evaluation-ER physician did speak with Dr. Myra GianottiBrabham who recommended a noncontrast CT scan-which was done-which showed an infrarenal abdominal aortic aneurysm measuring 5.5 cm in AP dimension no enlarged  retroperitoneal or mesenteric adenopathy no enlarged pelvic or inguinal lymph nodes.  Dr.  Myra GianottiBrabham was contacted about the results but the ER physician and recommendation was for follow-up in his office.  She currently is not complaining of any abdominal or back pain and has not she did not complain of this pain in the ER either.  Clinically she appears stable she has returned the facility and appears to be at her baseline.  In regards to the other findings-there were no acute findings found on the ultrasound or the CT scan  There was some chronic interstitial changes noted in both lung bases including interstitial reticulation and bronchiietasis---follow-up high-resolution CT of the chest was recommended in 6 months to follow-up for any changes.  She does not complaining any increased cough or congestion in this regards.  Of note she is on low-dose Eliquis  secondary to her recent diagnosis of atrial fibrillation and this has been evaluated by cardiology  .        Past Medical History:  Diagnosis Date  . Allergy   . Anemia    iron def  . Cataract   . Chronic combined systolic (congestive) and diastolic (congestive) heart failure (HCC)   . Diabetes mellitus without complication (HCC)   . Dysphagia   . Hyperlipidemia   . Hypertension   . Muscle weakness   . Renal disorder    chroninc kidney disease  . Thyroid disease    Past Surgical History:  Procedure Laterality Date  . ABDOMINAL HYSTERECTOMY     partial but later ovaries removed  . EYE SURGERY Bilateral    cataract  .  OOPHORECTOMY Bilateral     No Known Allergies  Outpatient Encounter Prescriptions as of 09/19/2016  Medication Sig  . apixaban (ELIQUIS) 2.5 MG TABS tablet Take 2.5 mg by mouth 2 (two) times daily.  . bisacodyl (DULCOLAX) 5 MG EC tablet Take 2 tablets (10 mg total) by mouth daily as needed for mild constipation or moderate constipation.  . calcium carbonate (OS-CAL) 600 MG TABS tablet Take 600 mg  by mouth every other day.   . carvedilol (COREG) 12.5 MG tablet Take 12.5 mg by mouth 2 (two) times daily with a meal. 1 bid  . docusate sodium (COLACE) 100 MG capsule Take 2 capsules (200 mg total) by mouth 2 (two) times daily.  . hydrALAZINE (APRESOLINE) 50 MG tablet Take 1 tablet (50 mg total) by mouth every 8 (eight) hours.  . isosorbide mononitrate (IMDUR) 30 MG 24 hr tablet Take 1 tablet (30 mg total) by mouth daily.  Marland Kitchen levothyroxine (SYNTHROID, LEVOTHROID) 88 MCG tablet Take 1 tablet (88 mcg total) by mouth daily before breakfast.  . Omega-3 Fatty Acids (FISH OIL) 1000 MG CAPS Take 1 capsule by mouth every other day.   . polyethylene glycol (MIRALAX / GLYCOLAX) packet Take 17 g by mouth daily.   No facility-administered encounter medications on file as of 09/19/2016.     Review of Systems   General she is not complaining of any fever or chills.  Skin does not complain of rashes or itching does have somewhat of a jaundiced appearance.  Head ears eyes nose mouth and throat no complaint of visual changes or sore throat.  Respiratory does not complaining of any shortness of breath or cough at this time.  Cardiac denies chest pain has some mild lower extremity edema.  GI does not complain of abdominal pain nausea vomiting diarrhea constipation.  Muscle skeletal does not really complain of any acute pain-her back pain.  Neurologic does not complaining of dizziness headache or numbness or syncope.  Psych does not complaining of overt depression or anxiety continues to be in good spirits  Immunization History  Administered Date(s) Administered  . Influenza, High Dose Seasonal PF 10/16/2012, 11/03/2013, 11/02/2014, 10/26/2015  . Pneumococcal Conjugate-13 07/29/2014  . Pneumococcal Polysaccharide-23 11/14/2008  . Zoster 03/14/2008   Pertinent  Health Maintenance Due  Topic Date Due  . FOOT EXAM  10/11/2016 (Originally 07/10/2016)  . OPHTHALMOLOGY EXAM  10/11/2016 (Originally  06/22/2016)  . DEXA SCAN  10/11/2016 (Originally 07/15/2016)  . INFLUENZA VACCINE  01/05/2017 (Originally 09/05/2016)  . URINE MICROALBUMIN  10/25/2016  . HEMOGLOBIN A1C  02/03/2017  . PNA vac Low Risk Adult  Completed   Fall Risk  08/17/2016 05/07/2016 04/23/2016 11/22/2015 11/15/2015  Falls in the past year? Yes Yes Yes No No  Number falls in past yr: 1 1 1  - -  Injury with Fall? No Yes No - -  Comment - Bruised chin and face. Laceration on lip. - - -  Risk for fall due to : - - - - -   Functional Status Survey:    She is afebrile pulse of 80 respirations 20 blood pressure 138/69.  Weight is 115.6 pounds  Physical Exam In general this is a pleasant somewhat thin elderly female in no distress.Lying comfortably in bed  Her skin is warm and dry. She does appear to have some chronic old bruising sites diffuse Her skin continues to show show evidence of jaundice  Eyes visual acuity appears grossly intact sclera and conjunctiva are clear.  Ears she  is hard of hearing.  Chest is clear to auscultation there is no labored breathing breath sounds are slightly reduced. Respiratory effort is somewhat poor  Heart is regular rate and rhythm witha slight systolicmurmur she has trace lower extremity edema  Abdomen is soft somewhat protuberant nontender with positive bowel sounds.    Musculoskeletal is able to move all extremities 4 strength appears to be  intact   She does have wrapping of her right foot   Neurologic is grossly intact her speech is clear no lateralizing findings.  Psych she is alert and oriented pleasant and appropriate  Labs reviewed:  Recent Labs  08/05/16 0621  09/13/16 1036 09/14/16 0200 09/17/16 0700  NA  --   < > 132* 133* 134*  K  --   < > 4.3 4.1 4.0  CL  --   < > 98* 102 101  CO2  --   < > 26 25 26   GLUCOSE  --   < > 257* 168* 122*  BUN  --   < > 44* 41* 28*  CREATININE  --   < > 1.53* 1.38* 1.34*  CALCIUM  --   < > 8.5* 8.0* 8.1*  MG  1.7  --   --   --   --   < > = values in this interval not displayed.  Recent Labs  04/23/16 0912 09/04/16 1846 09/13/16 1036 09/14/16 0200  AST 18 57* 32  --   ALT 8 42 32  --   ALKPHOS 76 106 96  --   BILITOT 0.6 2.7* 2.8* 2.6*  PROT 6.9 6.0* 6.4*  --   ALBUMIN 4.0 2.5* 2.7*  --     Recent Labs  09/12/16 0800 09/14/16 0200 09/17/16 0700  WBC 6.3 4.7 4.7  NEUTROABS 4.4 2.8 2.9  HGB 10.2* 8.8* 9.7*  HCT 29.6* 25.2* 28.2*  MCV 91.4 89.7 91.0  PLT 112* 125* 173   Lab Results  Component Value Date   TSH 1.986 08/04/2016   Lab Results  Component Value Date   HGBA1C 4.8 08/04/2016   Lab Results  Component Value Date   CHOL 129 04/23/2016   HDL 45 04/23/2016   LDLCALC 62 04/23/2016   TRIG 108 04/23/2016   CHOLHDL 2.9 04/23/2016    Significant Diagnostic Results in last 30 days:  Dg Chest 2 View  Result Date: 09/04/2016 CLINICAL DATA:  Fall EXAM: CHEST  2 VIEW COMPARISON:  08/04/2016 FINDINGS: Chronic lung disease with hyperinflation and prominent lung markings bilaterally. No focal consolidation. Negative for heart failure or effusion. No acute fracture is identified. IMPRESSION: Chronic lung disease. No acute abnormality. Improved aeration since the prior study. Electronically Signed   By: Marlan Palau M.D.   On: 09/04/2016 16:02   Ct Head Wo Contrast  Result Date: 09/04/2016 CLINICAL DATA:  Generalized weakness. Vertigo and dizziness. 2 falls in the past month. Weight loss. EXAM: CT HEAD WITHOUT CONTRAST TECHNIQUE: Contiguous axial images were obtained from the base of the skull through the vertex without intravenous contrast. COMPARISON:  None. FINDINGS: Brain: Diffusely enlarged ventricles and subarachnoid spaces. Patchy white matter low density in both cerebral hemispheres. No intracranial hemorrhage, mass lesion or CT evidence of acute infarction. Vascular: No hyperdense vessel or unexpected calcification. Skull: Mild bilateral hyperostosis frontalis.  Sinuses/Orbits: Bilateral lens implants. Unremarkable included paranasal sinuses. Other:  None. IMPRESSION: 1. No acute abnormality. 2. Mild to moderate diffuse cerebral and cerebellar atrophy. 3. Moderate chronic small vessel white matter ischemic  changes in both cerebral hemispheres. Electronically Signed   By: Beckie Salts M.D.   On: 09/04/2016 16:21   US Abdomen Complete  Result Date: 09/19/2016 CLINICAL DATA:  Increased bilirubin, history hypertension, diabetes mellitus EXAM: ABDOMEN ULTRASOUND COMPLETE COMPARISON:  08/06/2016 renal ultrasound FINDINGS: Gallbladder: 2.2 cm diameter mildly hyperechoic nonshadowing focus within the gallbladder lumen likely a sludge ball. No discrete shadowing calculi identified. No gallbladder wall thickening, pericholecystic fluid or sonographic Murphy sign. Common bile duct: Diameter: 4 mm diameter, normal Liver: Normal appearance.  Hepatopetal portal venous flow. IVC: Normal appearance Pancreas: Normal appearance Spleen: 9.9 cm length. Slightly rounded appearance without focal mass Right Kidney: Length: 9.8 cm. Borderline increased echogenicity without mass or hydronephrosis Left Kidney: Length: 10.2 cm. Minimally increased cortical echogenicity without mass or hydronephrosis Abdominal aorta: Aneurysmal dilatation of the mid to distal abdominal aorta measuring up to 5.0 cm AP at the mid aorta and 5.3 cm AP at the distal aorta. Transverse dimension is less well demonstrated but potentially 6 cm. Other findings: No free fluid IMPRESSION: Probable sludge ball within gallbladder without definite evidence of cholelithiasis or acute cholecystitis. Aneurysmal dilatation of the mid to distal abdominal aorta measuring 5.0 cm AP at mid aorta and 5.3 cm distally, with transverse dimensions potentially in excess of 6 cm. Vascular surgery consultation recommended due to increased risk of rupture for AAA >5.5 cm. This recommendation follows ACR consensus guidelines: White Paper of the  ACR Incidental Findings Committee II on Vascular Findings. J Am Coll Radiol 2013; 10:789-794. Findings called to Vicky RN at [facility] on 09/19/2016 at 0948 hrs. Electronically Signed   By: Ulyses Southward M.D.   On: 09/19/2016 09:53    Assessment/Plan  #1-ultrasound showing incidental finding of abdominal aortic aneurysm as noted above--this is a new problem--it appears she does have. Follow-up with vascular surgery who  was consulted during her ER visit - this is complicated with her anticoagulation with  Eliquis --nonetheless she does appear stable and asymptomatic point.  We will wait to be followed vascular surgery and monitor clinically this was discussed with Dr  Chales Abrahams . via phone.  2 atrial fibrillation this appears rate well continues on Coreg for rate control and  Eliquis for anticoagulation.  #3 history of systolic CHF ejection fraction 35-40 % %--she continues on Coreg-Imdur and hydralazine appears stable her weights have been stable but this will have to be watched    4- anemia-hemoglobin appears to be stabilized update labs are pending she will need a GI workup and consult is pending.-Hemoglobin on August 13 was 9.7  #5 renal insufficiency this appears stabilized with a creatinine of 1.34 on lab done on update lab pending.  #6 history of elevated bilirubin again ultrasound did not show any acute process in regards to her liver or pancreas-liver function tests of been within normal limits Dr. Chales Abrahams has assess this and questions whether hemolysis be causing the anemia with guaiac-negative stool-update labs have been ordered including CBC  BMP and hepatic panel  [phone]

## 2016-09-19 NOTE — ED Notes (Signed)
[facility] called and gave report , advised to come get pt.

## 2016-09-19 NOTE — Telephone Encounter (Signed)
Tell sister that she had some test run today and there is  No reports in computer yet so let me see what test show first.

## 2016-09-19 NOTE — Discharge Instructions (Signed)
The vascular surgeon should call for a follow-up.

## 2016-09-20 ENCOUNTER — Encounter: Payer: Self-pay | Admitting: Internal Medicine

## 2016-09-20 ENCOUNTER — Other Ambulatory Visit (HOSPITAL_COMMUNITY)
Admission: RE | Admit: 2016-09-20 | Discharge: 2016-09-20 | Disposition: A | Discharge status: Home / Self Care | Payer: Medicare Other | Source: Skilled Nursing Facility | Attending: Internal Medicine | Admitting: Internal Medicine

## 2016-09-20 DIAGNOSIS — I5042 Chronic combined systolic (congestive) and diastolic (congestive) heart failure: Secondary | ICD-10-CM | POA: Insufficient documentation

## 2016-09-20 LAB — CBC WITH DIFFERENTIAL/PLATELET
Basophils Absolute: 0.1 10*3/uL (ref 0.0–0.1)
Basophils Relative: 1 %
Eosinophils Absolute: 0.2 10*3/uL (ref 0.0–0.7)
Eosinophils Relative: 4 %
HEMATOCRIT: 28.2 % — AB (ref 36.0–46.0)
HEMOGLOBIN: 9.7 g/dL — AB (ref 12.0–15.0)
Lymphocytes Relative: 32 %
Lymphs Abs: 1.5 10*3/uL (ref 0.7–4.0)
MCH: 31.4 pg (ref 26.0–34.0)
MCHC: 34.4 g/dL (ref 30.0–36.0)
MCV: 91.3 fL (ref 78.0–100.0)
MONOS PCT: 9 %
Monocytes Absolute: 0.4 10*3/uL (ref 0.1–1.0)
NEUTROS ABS: 2.7 10*3/uL (ref 1.7–7.7)
NEUTROS PCT: 54 %
Platelets: 183 10*3/uL (ref 150–400)
RBC: 3.09 MIL/uL — ABNORMAL LOW (ref 3.87–5.11)
RDW: 19 % — ABNORMAL HIGH (ref 11.5–15.5)
WBC: 4.9 10*3/uL (ref 4.0–10.5)

## 2016-09-20 LAB — BASIC METABOLIC PANEL
Anion gap: 7 (ref 5–15)
BUN: 24 mg/dL — AB (ref 6–20)
CHLORIDE: 100 mmol/L — AB (ref 101–111)
CO2: 26 mmol/L (ref 22–32)
CREATININE: 1.34 mg/dL — AB (ref 0.44–1.00)
Calcium: 8.1 mg/dL — ABNORMAL LOW (ref 8.9–10.3)
GFR calc Af Amer: 42 mL/min — ABNORMAL LOW (ref >60)
GFR calc non Af Amer: 37 mL/min — ABNORMAL LOW (ref >60)
GLUCOSE: 149 mg/dL — AB (ref 65–99)
Potassium: 3.5 mmol/L (ref 3.5–5.1)
Sodium: 133 mmol/L — ABNORMAL LOW (ref 135–145)

## 2016-09-20 LAB — HEPATIC FUNCTION PANEL
ALBUMIN: 2.5 g/dL — AB (ref 3.5–5.0)
ALK PHOS: 90 U/L (ref 38–126)
ALT: 21 U/L (ref 14–54)
AST: 29 U/L (ref 15–41)
BILIRUBIN INDIRECT: 1 mg/dL — AB (ref 0.3–0.9)
BILIRUBIN TOTAL: 1.5 mg/dL — AB (ref 0.3–1.2)
Bilirubin, Direct: 0.5 mg/dL (ref 0.1–0.5)
Total Protein: 5.8 g/dL — ABNORMAL LOW (ref 6.5–8.1)

## 2016-09-20 NOTE — Telephone Encounter (Signed)
Comment was documented that there was a message left on 09/06/16. Pt went to ED same day of telephone encounter and was admitted Was Discharged on 09/08/16.

## 2016-09-20 NOTE — Progress Notes (Signed)
Location:   [facility] Room Number: 132/P Place of Service:  SNF (31) Provider:  Marlow Baars, Mary-Margaret, FNP  Patient Care Team: Bennie Pierini, FNP as PCP - General (Nurse Practitioner) [place]l [place], OD as Consulting Physician [facility])  Extended Emergency Contact Information Primary Emergency Contact: Gibson,Reba Address: [address] [place], [place] [address] [place] of [place] Home Phone: [phone] Work Phone: [phone] Relation: Sister  Code Status:  Full Code Goals of care: Advanced Directive information Advanced Directives 09/20/2016  Does Patient Have a Medical Advance Directive? Yes  Type of Advance Directive (No Data)  Does patient want to make changes to medical advance directive? No - Patient declined  Copy of Healthcare Power of Attorney in Chart? -  Would patient like information on creating a medical advance directive? No - Patient declined     Chief Complaint  Patient presents with  . Acute Visit    F/U    HPI:  Pt is a 81 y.o. female seen today for an acute visit for    Past Medical History:  Diagnosis Date  . Allergy   . Anemia    iron def  . Cataract   . Chronic combined systolic (congestive) and diastolic (congestive) heart failure (HCC)   . Diabetes mellitus without complication (HCC)   . Dysphagia   . Hyperlipidemia   . Hypertension   . Muscle weakness   . Renal disorder    chroninc kidney disease  . Thyroid disease    Past Surgical History:  Procedure Laterality Date  . ABDOMINAL HYSTERECTOMY     partial but later ovaries removed  . EYE SURGERY Bilateral    cataract  . OOPHORECTOMY Bilateral     No Known Allergies  Allergies as of 09/20/2016   No Known Allergies     Medication List       Accurate as of 09/20/16  9:09 AM. Always use your most recent med list.          bisacodyl 5 MG EC tablet Commonly known as:  DULCOLAX Take 2 tablets (10 mg total)  by mouth daily as needed for mild constipation or moderate constipation.   calcium carbonate 600 MG Tabs tablet Commonly known as:  OS-CAL Take 600 mg by mouth every other day.   carvedilol 12.5 MG tablet Commonly known as:  COREG Take 12.5 mg by mouth 2 (two) times daily with a meal. 1 bid   docusate sodium 100 MG capsule Commonly known as:  COLACE Take 2 capsules (200 mg total) by mouth 2 (two) times daily.   ELIQUIS 2.5 MG Tabs tablet Generic drug:  apixaban Take 2.5 mg by mouth 2 (two) times daily.   Fish Oil 1000 MG Caps Take 1 capsule by mouth every other day.   hydrALAZINE 50 MG tablet Commonly known as:  APRESOLINE Take 1 tablet (50 mg total) by mouth every 8 (eight) hours.   isosorbide mononitrate 30 MG 24 hr tablet Commonly known as:  IMDUR Take 1 tablet (30 mg total) by mouth daily.   levothyroxine 88 MCG tablet Commonly known as:  SYNTHROID, LEVOTHROID Take 1 tablet (88 mcg total) by mouth daily before breakfast.   polyethylene glycol packet Commonly known as:  MIRALAX / GLYCOLAX Take 17 g by mouth daily.       Review of Systems  Immunization History  Administered Date(s) Administered  . Influenza, High Dose Seasonal PF 10/16/2012, 11/03/2013,  11/02/2014, 10/26/2015  . Pneumococcal Conjugate-13 07/29/2014  . Pneumococcal Polysaccharide-23 11/14/2008  . Zoster 03/14/2008   Pertinent  Health Maintenance Due  Topic Date Due  . FOOT EXAM  10/11/2016 (Originally 07/10/2016)  . OPHTHALMOLOGY EXAM  10/11/2016 (Originally 06/22/2016)  . DEXA SCAN  10/11/2016 (Originally 07/15/2016)  . INFLUENZA VACCINE  01/05/2017 (Originally 09/05/2016)  . URINE MICROALBUMIN  10/25/2016  . HEMOGLOBIN A1C  02/03/2017  . PNA vac Low Risk Adult  Completed   Fall Risk  08/17/2016 05/07/2016 04/23/2016 11/22/2015 11/15/2015  Falls in the past year? Yes Yes Yes No No  Number falls in past yr: 1 1 1  - -  Injury with Fall? No Yes No - -  Comment - Bruised chin and face. Laceration on  lip. - - -  Risk for fall due to : - - - - -   Functional Status Survey:    There were no vitals filed for this visit. There is no height or weight on file to calculate BMI. Physical Exam  Labs reviewed:  Recent Labs  08/05/16 0621  09/13/16 1036 09/14/16 0200 09/17/16 0700  NA  --   < > 132* 133* 134*  K  --   < > 4.3 4.1 4.0  CL  --   < > 98* 102 101  CO2  --   < > 26 25 26   GLUCOSE  --   < > 257* 168* 122*  BUN  --   < > 44* 41* 28*  CREATININE  --   < > 1.53* 1.38* 1.34*  CALCIUM  --   < > 8.5* 8.0* 8.1*  MG 1.7  --   --   --   --   < > = values in this interval not displayed.  Recent Labs  04/23/16 0912 09/04/16 1846 09/13/16 1036 09/14/16 0200  AST 18 57* 32  --   ALT 8 42 32  --   ALKPHOS 76 106 96  --   BILITOT 0.6 2.7* 2.8* 2.6*  PROT 6.9 6.0* 6.4*  --   ALBUMIN 4.0 2.5* 2.7*  --     Recent Labs  09/12/16 0800 09/14/16 0200 09/17/16 0700  WBC 6.3 4.7 4.7  NEUTROABS 4.4 2.8 2.9  HGB 10.2* 8.8* 9.7*  HCT 29.6* 25.2* 28.2*  MCV 91.4 89.7 91.0  PLT 112* 125* 173   Lab Results  Component Value Date   TSH 1.986 08/04/2016   Lab Results  Component Value Date   HGBA1C 4.8 08/04/2016   Lab Results  Component Value Date   CHOL 129 04/23/2016   HDL 45 04/23/2016   LDLCALC 62 04/23/2016   TRIG 108 04/23/2016   CHOLHDL 2.9 04/23/2016    Significant Diagnostic Results in last 30 days:  Ct Abdomen Pelvis Wo Contrast  Result Date: 09/19/2016 CLINICAL DATA:  Abdominal aortic aneurysm. EXAM: CT ABDOMEN AND PELVIS WITHOUT CONTRAST TECHNIQUE: Multidetector CT imaging of the abdomen and pelvis was performed following the standard protocol without IV contrast. COMPARISON:  None FINDINGS: Lower chest: Calcified granuloma identified in the right base. Bilateral lower lobe interstitial reticulation and mild bronchiectasis is identified. No frank honeycombing. Hepatobiliary: No focal liver abnormality identified. Gallbladder appears normal. Pancreas:  Unremarkable. No pancreatic ductal dilatation or surrounding inflammatory changes. Spleen: Normal in size without focal abnormality. Adrenals/Urinary Tract: The adrenal glands are normal. There are bilateral renal calcifications. Most of these are likely vascular in nature. No hydronephrosis. Urinary bladder appears normal. Stomach/Bowel: The stomach is normal. The  small bowel loops have a normal course and caliber. No bowel obstruction. The colon is unremarkable. Vascular/Lymphatic: Aortic atherosclerosis. There is an infrarenal abdominal aortic aneurysm measuring 5.5 cm in AP dimension, image 69 of series 6. No enlarged retroperitoneal or mesenteric adenopathy. No enlarged pelvic or inguinal lymph nodes. Reproductive: Status post hysterectomy. No adnexal masses. Other: No abdominal wall hernia or abnormality. No abdominopelvic ascites. Musculoskeletal: There is a scoliosis deformity involving the lumbar spine which is convex to the left. Degenerative disc disease is identified within the lumbar spine. No aggressive lytic or sclerotic bone lesions. IMPRESSION: 1. No acute findings within the abdomen or pelvis. 2. Aortic aneurysm NOS (ICD10-I71.9). Recommend followup by abdomen and pelvis CTA in 3-6 months, and vascular surgery referral/consultation if not already obtained. This recommendation follows ACR consensus guidelines: White Paper of the ACR Incidental Findings Committee II on Vascular Findings. J Am Coll Radiol 2013; 10:789-794. 3. Chronic interstitial changes are identified in both lung bases including interstitial reticulation and bronchiectasis. Findings may reflect early usual interstitial pneumonia or nonspecific interstitial pneumonia. A followup high-resolution CT of the chest in 6 months is advised to assess for any temporal change. Electronically Signed   By: Signa Kell M.D.   On: 09/19/2016 13:01   Dg Chest 2 View  Result Date: 09/04/2016 CLINICAL DATA:  Fall EXAM: CHEST  2 VIEW  COMPARISON:  08/04/2016 FINDINGS: Chronic lung disease with hyperinflation and prominent lung markings bilaterally. No focal consolidation. Negative for heart failure or effusion. No acute fracture is identified. IMPRESSION: Chronic lung disease. No acute abnormality. Improved aeration since the prior study. Electronically Signed   By: Marlan Palau M.D.   On: 09/04/2016 16:02   Ct Head Wo Contrast  Result Date: 09/04/2016 CLINICAL DATA:  Generalized weakness. Vertigo and dizziness. 2 falls in the past month. Weight loss. EXAM: CT HEAD WITHOUT CONTRAST TECHNIQUE: Contiguous axial images were obtained from the base of the skull through the vertex without intravenous contrast. COMPARISON:  None. FINDINGS: Brain: Diffusely enlarged ventricles and subarachnoid spaces. Patchy white matter low density in both cerebral hemispheres. No intracranial hemorrhage, mass lesion or CT evidence of acute infarction. Vascular: No hyperdense vessel or unexpected calcification. Skull: Mild bilateral hyperostosis frontalis. Sinuses/Orbits: Bilateral lens implants. Unremarkable included paranasal sinuses. Other:  None. IMPRESSION: 1. No acute abnormality. 2. Mild to moderate diffuse cerebral and cerebellar atrophy. 3. Moderate chronic small vessel white matter ischemic changes in both cerebral hemispheres. Electronically Signed   By: Beckie Salts M.D.   On: 09/04/2016 16:21   US Abdomen Complete  Result Date: 09/19/2016 CLINICAL DATA:  Increased bilirubin, history hypertension, diabetes mellitus EXAM: ABDOMEN ULTRASOUND COMPLETE COMPARISON:  08/06/2016 renal ultrasound FINDINGS: Gallbladder: 2.2 cm diameter mildly hyperechoic nonshadowing focus within the gallbladder lumen likely a sludge ball. No discrete shadowing calculi identified. No gallbladder wall thickening, pericholecystic fluid or sonographic Murphy sign. Common bile duct: Diameter: 4 mm diameter, normal Liver: Normal appearance.  Hepatopetal portal venous flow. IVC:  Normal appearance Pancreas: Normal appearance Spleen: 9.9 cm length. Slightly rounded appearance without focal mass Right Kidney: Length: 9.8 cm. Borderline increased echogenicity without mass or hydronephrosis Left Kidney: Length: 10.2 cm. Minimally increased cortical echogenicity without mass or hydronephrosis Abdominal aorta: Aneurysmal dilatation of the mid to distal abdominal aorta measuring up to 5.0 cm AP at the mid aorta and 5.3 cm AP at the distal aorta. Transverse dimension is less well demonstrated but potentially 6 cm. Other findings: No free fluid IMPRESSION: Probable sludge ball within gallbladder  without definite evidence of cholelithiasis or acute cholecystitis. Aneurysmal dilatation of the mid to distal abdominal aorta measuring 5.0 cm AP at mid aorta and 5.3 cm distally, with transverse dimensions potentially in excess of 6 cm. Vascular surgery consultation recommended due to increased risk of rupture for AAA >5.5 cm. This recommendation follows ACR consensus guidelines: White Paper of the ACR Incidental Findings Committee II on Vascular Findings. J Am Coll Radiol 2013; 10:789-794. [facility] on 09/19/2016 at 0948 hrs. Electronically Signed   By: Ulyses SouthwardMark  Boles M.D.   On: 09/19/2016 09:53    Assessment/Plan There are no diagnoses linked to this encounter.   Family/ staff Communication:   Labs/tests ordered:     This encounter was created in error - please disregard.

## 2016-09-24 ENCOUNTER — Telehealth: Payer: Self-pay | Admitting: Surgery

## 2016-09-24 ENCOUNTER — Encounter (HOSPITAL_COMMUNITY)
Admission: RE | Admit: 2016-09-24 | Discharge: 2016-09-24 | Disposition: A | Discharge status: Home / Self Care | Payer: Medicare Other | Source: Skilled Nursing Facility | Attending: *Deleted | Admitting: *Deleted

## 2016-09-24 LAB — BASIC METABOLIC PANEL
Anion gap: 7 (ref 5–15)
BUN: 30 mg/dL — AB (ref 6–20)
CALCIUM: 8 mg/dL — AB (ref 8.9–10.3)
CO2: 25 mmol/L (ref 22–32)
Chloride: 103 mmol/L (ref 101–111)
Creatinine, Ser: 1.68 mg/dL — ABNORMAL HIGH (ref 0.44–1.00)
GFR calc Af Amer: 32 mL/min — ABNORMAL LOW (ref >60)
GFR, EST NON AFRICAN AMERICAN: 28 mL/min — AB (ref >60)
Glucose, Bld: 109 mg/dL — ABNORMAL HIGH (ref 65–99)
POTASSIUM: 3.5 mmol/L (ref 3.5–5.1)
SODIUM: 135 mmol/L (ref 135–145)

## 2016-09-24 LAB — CBC
HCT: 25.1 % — ABNORMAL LOW (ref 36.0–46.0)
Hemoglobin: 8.6 g/dL — ABNORMAL LOW (ref 12.0–15.0)
MCH: 31.3 pg (ref 26.0–34.0)
MCHC: 34.3 g/dL (ref 30.0–36.0)
MCV: 91.3 fL (ref 78.0–100.0)
PLATELETS: 206 10*3/uL (ref 150–400)
RBC: 2.75 MIL/uL — AB (ref 3.87–5.11)
RDW: 18.2 % — ABNORMAL HIGH (ref 11.5–15.5)
WBC: 4.9 10*3/uL (ref 4.0–10.5)

## 2016-09-24 NOTE — Telephone Encounter (Signed)
-----   Message from Renato Gails sent at 09/24/2016  1:31 PM EDT ----- Regarding: [facility] New patient appt   ----- Message ----- From: Nada Libman, MD Sent: 09/21/2016   8:13 PM To: [facility] Charge Pool  Can't remember if I called this in.  She has a 5.5 cm AAA as incidental finding.  Please get her in as new patient within 2-3 weeks.  Thanks

## 2016-09-24 NOTE — Telephone Encounter (Signed)
Spoke to facility transportation, made appt for 8/23 with [facility]

## 2016-09-25 ENCOUNTER — Encounter: Payer: Self-pay | Admitting: Vascular Surgery

## 2016-09-26 ENCOUNTER — Encounter: Payer: Self-pay | Admitting: Internal Medicine

## 2016-09-26 ENCOUNTER — Encounter: Payer: Self-pay | Admitting: Vascular Surgery

## 2016-09-26 ENCOUNTER — Non-Acute Institutional Stay (SKILLED_NURSING_FACILITY): Payer: Medicare Other | Admitting: Internal Medicine

## 2016-09-26 DIAGNOSIS — E1100 Type 2 diabetes mellitus with hyperosmolarity without nonketotic hyperglycemic-hyperosmolar coma (NKHHC): Secondary | ICD-10-CM | POA: Diagnosis not present

## 2016-09-26 DIAGNOSIS — R17 Unspecified jaundice: Secondary | ICD-10-CM

## 2016-09-26 DIAGNOSIS — I5042 Chronic combined systolic (congestive) and diastolic (congestive) heart failure: Secondary | ICD-10-CM | POA: Diagnosis not present

## 2016-09-26 DIAGNOSIS — I714 Abdominal aortic aneurysm, without rupture, unspecified: Secondary | ICD-10-CM

## 2016-09-26 DIAGNOSIS — I480 Paroxysmal atrial fibrillation: Secondary | ICD-10-CM | POA: Diagnosis not present

## 2016-09-26 DIAGNOSIS — E039 Hypothyroidism, unspecified: Secondary | ICD-10-CM

## 2016-09-26 DIAGNOSIS — D649 Anemia, unspecified: Secondary | ICD-10-CM | POA: Diagnosis not present

## 2016-09-26 NOTE — Progress Notes (Signed)
Location:   [facility] Room Number: 132/P Place of Service:  SNF (31)  Provider: Edmon Crape  PCP: Bennie Pierini, FNP Patient Care Team: Bennie Pierini, FNP as PCP - General (Nurse Practitioner) Derryl Harbor, OD as Consulting Physician [facility])  Extended Emergency Contact Information Primary Emergency Contact: Gibson,Reba Address: [address] [place], [place] [address] [place] of [place] Home Phone: [phone] Work Phone: [phone] Relation: Sister  Code Status: Full Code Goals of care:  Advanced Directive information Advanced Directives 09/26/2016  Does Patient Have a Medical Advance Directive? Yes  Type of Advance Directive (No Data)  Does patient want to make changes to medical advance directive? No - Patient declined  Copy of Healthcare Power of Attorney in Chart? -  Would patient like information on creating a medical advance directive? No - Patient declined     No Known Allergies  Chief complaint-   Follow-up of anemia-abdominal aortic aneurysm-elevated bilirubin as well as atrial fibrillation Renal insufficiency  HPI:  81 y.o. female  seen today for follow-up of anemia as atrial fibrillation-elevated bilirubin-as well as incidental finding of abdominal aortic aneurysm--renal insufficiency.  She was initially admitted to skilled rehabilitation after hospitalization for symptomatic anemia and acute kidney injury.  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  This is complicated with acute renal insufficiency.  She was diuresed in the hospital.  After hospitalization for CHF and and followingdischarge she experienced progressive weakness and a weight loss and had a fall.  CT scan did not show any acute abnormality she did receive IV fluids and her creatinine improved.  .  She had significant anemia and received a transfusion OB was negative in  the hospital.  She was discharged to skilled nursing for PT and OT.  On admission to facility she was found to have significant hypokalemia 2.6 this was supplemented and quickly normalized.  Guards to anemia hemoglobin did rise up to 10.2-  When she was initially admitted by Dr. Alwyn Ren to facility-she was found to be in atrial fibrillation and her Coreg was increased and started on low-doseEliquis she was started on low-dose 2.5 mg twice a day based on renal function age and body weight.  Her atrial fibrillation appears to be rate controlled and clinically appears to have done well here-she was noted to have an elevated bilirubin ranging in the 2.6-2.8 area.  Subsequently an ultrasound was done which did not show any acute abnormality of liver or pancreas but did have an incidental finding of an abdominal aortic aneurysm  CT scan confirmed this which showed an infrarenal abdominal aorta can't risen measuring 5.5 cm in AP dimension-vascular surgery was contacted about this and they have arranged follow-up which will be done tomorrow.  She does not complain of any abdominal pain.  In regards to bilirubin most recent lab this was down to 1.5 she continues to have somewhat of a jaundiced appearance of her skin but has no complaints of abdominal issues.  In regards to renal insufficiency this has shown some variability creatinine was 1.5 nightly of 45 2 weeks ago-this has shown some variation going down to 1.344 creatinine proximally week ago and almost recent lab was up to 1.6 a BUN of 30 on lab done 2 days ago.  She continues to be drink fairly well she has no complaints.  Regards to CHF she continues on Coreg Imdur hydralazine she  is not on a diuretic her weight appears to be relatively stable she's gained a couple pounds here over the past few days but not a persistent and weight appears to be stabilized in the 1:15-119 area-- her edema appears to be relatively baseline she does not complain  of shortness of breath  Currently vital signs are stable she has no complaints would really like to go home in the next few days if possible   Regards to anemia. Hemoglobin has trended down somewhat and was 8.6 on lab done on August 20-she has been evaluated by cardiology with recommendation to follow hemoglobin closely and if it drops significantly to consider discontinuing the Eliquis--   Also has a history of diabetes type 2 this is diet-controlled blood sugars in the morning appear to be largely in the lower mid 100s later in the day of bit more variability somewhat higher 100s occasionally low 200s      Past Medical History:  Diagnosis Date  . Allergy   . Anemia    iron def  . Cataract   . Chronic combined systolic (congestive) and diastolic (congestive) heart failure (HCC)   . Diabetes mellitus without complication (HCC)   . Dysphagia   . Hyperlipidemia   . Hypertension   . Muscle weakness   . Renal disorder    chroninc kidney disease  . Thyroid disease     Past Surgical History:  Procedure Laterality Date  . ABDOMINAL HYSTERECTOMY     partial but later ovaries removed  . EYE SURGERY Bilateral    cataract  . OOPHORECTOMY Bilateral       reports that she quit smoking about 7 weeks ago. Her smoking use included Cigarettes. She has a 55.00 pack-year smoking history. She has never used smokeless tobacco. She reports that she does not drink alcohol or use drugs. Social History   Social History  . Marital status: Single    Spouse name: N/A  . Number of children: N/A  . Years of education: N/A   Occupational History  . Retired    Social History Main Topics  . Smoking status: Former Smoker    Packs/day: 1.00    Years: 55.00    Types: Cigarettes    Quit date: 08/03/2016  . Smokeless tobacco: Never Used  . Alcohol use No  . Drug use: No  . Sexual activity: No   Other Topics Concern  . Not on file   Social History Narrative  . No narrative on file    Functional Status Survey:    No Known Allergies  Pertinent  Health Maintenance Due  Topic Date Due  . FOOT EXAM  10/11/2016 (Originally 07/10/2016)  . OPHTHALMOLOGY EXAM  10/11/2016 (Originally 06/22/2016)  . DEXA SCAN  10/11/2016 (Originally 07/15/2016)  . INFLUENZA VACCINE  01/05/2017 (Originally 09/05/2016)  . URINE MICROALBUMIN  10/25/2016  . HEMOGLOBIN A1C  02/03/2017  . PNA vac Low Risk Adult  Completed    Medications: Outpatient Encounter Prescriptions as of 09/26/2016  Medication Sig  . apixaban (ELIQUIS) 2.5 MG TABS tablet Take 2.5 mg by mouth 2 (two) times daily.  . bisacodyl (DULCOLAX) 5 MG EC tablet Take 2 tablets (10 mg total) by mouth daily as needed for mild constipation or moderate constipation.  . calcium carbonate (OS-CAL) 600 MG TABS tablet Take 600 mg by mouth every other day.   . carvedilol (COREG) 12.5 MG tablet Take 12.5 mg by mouth 2 (two) times daily with a meal. 1 bid  .  docusate sodium (COLACE) 100 MG capsule Take 2 capsules (200 mg total) by mouth 2 (two) times daily.  . hydrALAZINE (APRESOLINE) 50 MG tablet Take 1 tablet (50 mg total) by mouth every 8 (eight) hours.  . isosorbide mononitrate (IMDUR) 30 MG 24 hr tablet Take 1 tablet (30 mg total) by mouth daily.  Marland Kitchen levothyroxine (SYNTHROID, LEVOTHROID) 88 MCG tablet Take 1 tablet (88 mcg total) by mouth daily before breakfast.  . Omega-3 Fatty Acids (FISH OIL) 1000 MG CAPS Take 1 capsule by mouth every other day.   . polyethylene glycol (MIRALAX / GLYCOLAX) packet Take 17 g by mouth daily.   No facility-administered encounter medications on file as of 09/26/2016.      Review of Systems   Gen. she is not complaining any fever or chills.  Skin is not complaining of rashes itching or increased bruising continues again was somewhat of a jaundice presentation but this actually appears to have moderate at some.  Head ears eyes nose mouth and throat no complaints of difficulty swallowing sore throat visual  changes.  Respiratory denies shortness of breath or cough.  Cardiac denies chest pain as baseline lower extremity edema.  GI does not complain of any abdominal pain nausea vomiting diarrhea constipation.  Muscle skeletal does not complain of joint pain.  Neurologic is not complaining of any headache dizziness numbness syncopal-type feelings.  Psych is not complaining of depression or anxiety continues to be in good spirits and wants to go home  Vitals:   09/26/16 1312  BP: (!) 114/58  Pulse: 73  Resp: 20  Temp: 98.4 F (36.9 C)  TempSrc: Oral  Weight is 119 pounds  Physical Exam   Gen. this is a pleasant elderly female in no distress sitting comfortably in a chair.  Her skin is warm and dry has some chronic old bruising and the jaundice appearance appears to be moderated some from previous exam.  Eyes visual acuity appears intact her sclerae and conjunctivae remain clear.  Chest is clear to auscultation there is no labored breathing.  Heart is regular rate and rhythm with a mild systolic murmur she has I will say trace lower extremity edema.  Abdomen soft protuberant nontender with positive bowel sounds.  Musculoskeletal strength appears at baseline all 4 extremities.  Neurologic is grossly intact speech is clear no lateralizing findings cranial nerves appear grossly intact.  Psych she is alert and oriented pleasant and appropriate  Labs reviewed: Basic Metabolic Panel:  Recent Labs  16/10/96 0621  09/17/16 0700 09/20/16 0800 09/24/16 0630  NA  --   < > 134* 133* 135  K  --   < > 4.0 3.5 3.5  CL  --   < > 101 100* 103  CO2  --   < > 26 26 25   GLUCOSE  --   < > 122* 149* 109*  BUN  --   < > 28* 24* 30*  CREATININE  --   < > 1.34* 1.34* 1.68*  CALCIUM  --   < > 8.1* 8.1* 8.0*  MG 1.7  --   --   --   --   < > = values in this interval not displayed. Liver Function Tests:  Recent Labs  09/04/16 1846 09/13/16 1036 09/14/16 0200 09/20/16 0800  AST 57*  32  --  29  ALT 42 32  --  21  ALKPHOS 106 96  --  90  BILITOT 2.7* 2.8* 2.6* 1.5*  PROT 6.0* 6.4*  --  5.8*  ALBUMIN 2.5* 2.7*  --  2.5*   No results for input(s): LIPASE, AMYLASE in the last 8760 hours. No results for input(s): AMMONIA in the last 8760 hours. CBC:  Recent Labs  09/14/16 0200 09/17/16 0700 09/20/16 0800 09/24/16 0630  WBC 4.7 4.7 4.9 4.9  NEUTROABS 2.8 2.9 2.7  --   HGB 8.8* 9.7* 9.7* 8.6*  HCT 25.2* 28.2* 28.2* 25.1*  MCV 89.7 91.0 91.3 91.3  PLT 125* 173 183 206   Cardiac Enzymes:  Recent Labs  09/05/16 0005 09/05/16 0556 09/05/16 0813  TROPONINI 0.08* 0.07* 0.08*   BNP: Invalid input(s): POCBNP CBG:  Recent Labs  09/07/16 2111 09/08/16 0759 09/08/16 1150  GLUCAP 123* 133* 211*    Procedures and Imaging Studies During Stay: Ct Abdomen Pelvis Wo Contrast  Result Date: 09/19/2016 CLINICAL DATA:  Abdominal aortic aneurysm. EXAM: CT ABDOMEN AND PELVIS WITHOUT CONTRAST TECHNIQUE: Multidetector CT imaging of the abdomen and pelvis was performed following the standard protocol without IV contrast. COMPARISON:  None FINDINGS: Lower chest: Calcified granuloma identified in the right base. Bilateral lower lobe interstitial reticulation and mild bronchiectasis is identified. No frank honeycombing. Hepatobiliary: No focal liver abnormality identified. Gallbladder appears normal. Pancreas: Unremarkable. No pancreatic ductal dilatation or surrounding inflammatory changes. Spleen: Normal in size without focal abnormality. Adrenals/Urinary Tract: The adrenal glands are normal. There are bilateral renal calcifications. Most of these are likely vascular in nature. No hydronephrosis. Urinary bladder appears normal. Stomach/Bowel: The stomach is normal. The small bowel loops have a normal course and caliber. No bowel obstruction. The colon is unremarkable. Vascular/Lymphatic: Aortic atherosclerosis. There is an infrarenal abdominal aortic aneurysm measuring 5.5 cm in  AP dimension, image 69 of series 6. No enlarged retroperitoneal or mesenteric adenopathy. No enlarged pelvic or inguinal lymph nodes. Reproductive: Status post hysterectomy. No adnexal masses. Other: No abdominal wall hernia or abnormality. No abdominopelvic ascites. Musculoskeletal: There is a scoliosis deformity involving the lumbar spine which is convex to the left. Degenerative disc disease is identified within the lumbar spine. No aggressive lytic or sclerotic bone lesions. IMPRESSION: 1. No acute findings within the abdomen or pelvis. 2. Aortic aneurysm NOS (ICD10-I71.9). Recommend followup by abdomen and pelvis CTA in 3-6 months, and vascular surgery referral/consultation if not already obtained. This recommendation follows ACR consensus guidelines: White Paper of the ACR Incidental Findings Committee II on Vascular Findings. J Am Coll Radiol 2013; 10:789-794. 3. Chronic interstitial changes are identified in both lung bases including interstitial reticulation and bronchiectasis. Findings may reflect early usual interstitial pneumonia or nonspecific interstitial pneumonia. A followup high-resolution CT of the chest in 6 months is advised to assess for any temporal change. Electronically Signed   By: Signa Kell M.D.   On: 09/19/2016 13:01   Dg Chest 2 View  Result Date: 09/04/2016 CLINICAL DATA:  Fall EXAM: CHEST  2 VIEW COMPARISON:  08/04/2016 FINDINGS: Chronic lung disease with hyperinflation and prominent lung markings bilaterally. No focal consolidation. Negative for heart failure or effusion. No acute fracture is identified. IMPRESSION: Chronic lung disease. No acute abnormality. Improved aeration since the prior study. Electronically Signed   By: Marlan Palau M.D.   On: 09/04/2016 16:02   Ct Head Wo Contrast  Result Date: 09/04/2016 CLINICAL DATA:  Generalized weakness. Vertigo and dizziness. 2 falls in the past month. Weight loss. EXAM: CT HEAD WITHOUT CONTRAST TECHNIQUE: Contiguous axial  images were obtained from the base of the skull through the vertex without intravenous contrast. COMPARISON:  None. FINDINGS:  Brain: Diffusely enlarged ventricles and subarachnoid spaces. Patchy white matter low density in both cerebral hemispheres. No intracranial hemorrhage, mass lesion or CT evidence of acute infarction. Vascular: No hyperdense vessel or unexpected calcification. Skull: Mild bilateral hyperostosis frontalis. Sinuses/Orbits: Bilateral lens implants. Unremarkable included paranasal sinuses. Other:  None. IMPRESSION: 1. No acute abnormality. 2. Mild to moderate diffuse cerebral and cerebellar atrophy. 3. Moderate chronic small vessel white matter ischemic changes in both cerebral hemispheres. Electronically Signed   By: Beckie Salts M.D.   On: 09/04/2016 16:21   US Abdomen Complete  Result Date: 09/19/2016 CLINICAL DATA:  Increased bilirubin, history hypertension, diabetes mellitus EXAM: ABDOMEN ULTRASOUND COMPLETE COMPARISON:  08/06/2016 renal ultrasound FINDINGS: Gallbladder: 2.2 cm diameter mildly hyperechoic nonshadowing focus within the gallbladder lumen likely a sludge ball. No discrete shadowing calculi identified. No gallbladder wall thickening, pericholecystic fluid or sonographic Murphy sign. Common bile duct: Diameter: 4 mm diameter, normal Liver: Normal appearance.  Hepatopetal portal venous flow. IVC: Normal appearance Pancreas: Normal appearance Spleen: 9.9 cm length. Slightly rounded appearance without focal mass Right Kidney: Length: 9.8 cm. Borderline increased echogenicity without mass or hydronephrosis Left Kidney: Length: 10.2 cm. Minimally increased cortical echogenicity without mass or hydronephrosis Abdominal aorta: Aneurysmal dilatation of the mid to distal abdominal aorta measuring up to 5.0 cm AP at the mid aorta and 5.3 cm AP at the distal aorta. Transverse dimension is less well demonstrated but potentially 6 cm. Other findings: No free fluid IMPRESSION: Probable  sludge ball within gallbladder without definite evidence of cholelithiasis or acute cholecystitis. Aneurysmal dilatation of the mid to distal abdominal aorta measuring 5.0 cm AP at mid aorta and 5.3 cm distally, with transverse dimensions potentially in excess of 6 cm. Vascular surgery consultation recommended due to increased risk of rupture for AAA >5.5 cm. This recommendation follows ACR consensus guidelines: White Paper of the ACR Incidental Findings Committee II on Vascular Findings. J Am Coll Radiol 2013; 10:789-794. Findings called to Vicky RN at [facility] on 09/19/2016 at 0948 hrs. Electronically Signed   By: Ulyses Southward M.D.   On: 09/19/2016 09:53    Assessment/Plan:     #1 anemia-hemoglobin has trended down somewhat  lately in the mid nines mid 8 area --we'll update this tomorrow-she does not appear to be overtly symptomatic.  #2 history of atrial  fibrillation this appears rate controlled on Coreg she is on Eliquis for anticoagulation-this is low dose secondary to her anemia and renal function-cardiology has evaluated this and recommended close follow-up of hemoglobin since she is on anticoagulation.  #3 history of renal insufficiency creatinine has risen somewhat to 1.68-we'll update this tomorrow as well she is not on a diuretic apparently by mouth intake is fairly good.  #4-history of elevated bilirubin this has moderated with most recent bilirubin down to 1.5 will update this again ultrasound was reassuring in regards to the liver and pancreas  -#5 history of abdominal aortic aneurysm she is seeing vascular tomorrow for follow-up she does not appear overtly symptomatic at this point but with the size of the aneurysm certainly warrants follow-up.  #6 history of diabetes type 2-this appears under decent control with a.m. sugars adequate the morning in the lower mid 100s somewhat elevated later in the day at this point will monitor if she is about to be discharged would be hesitant to make  significant medication changes for fears of hypoglycemia when she is discharged-  Hemoglobin A1c actually was 4.8 on lab done on 08/04/2016  #7 history of  CHF combined systolic and diastolic edema appears to be relatively baseline she has had possibly some mild weight gain although this is not precipitous   this this will have to be watched again adding a diuretic is  challenging with her renal insufficiency but clinically she appears to be stable will have to keep a close eye however on her weights edema and clinical status  She continues on Coreg as well as Imdur and hydralazine  #8 hypothyroidism she continues on Synthroid-TSH was 1.986 within normal range on 08/04/2016   [id number]-of note greater than 40 minutes spent assessing patient-reviewing her chart-reviewing her labs-discussing her status with nursing staff-and coordinating and formulating a plan of care for numerous diagnoses-of note greater than 50% of time spent coordinating plan of care

## 2016-09-27 ENCOUNTER — Encounter (HOSPITAL_COMMUNITY)
Admission: RE | Admit: 2016-09-27 | Discharge: 2016-09-27 | Disposition: A | Discharge status: Home / Self Care | Payer: Medicare Other | Source: Skilled Nursing Facility | Attending: Internal Medicine | Admitting: Internal Medicine

## 2016-09-27 ENCOUNTER — Ambulatory Visit (INDEPENDENT_AMBULATORY_CARE_PROVIDER_SITE_OTHER): Payer: Medicare Other | Admitting: Vascular Surgery

## 2016-09-27 ENCOUNTER — Encounter: Payer: Self-pay | Admitting: Vascular Surgery

## 2016-09-27 VITALS — BP 121/70 | HR 74 | Temp 97.1°F | Resp 20 | Ht 62.0 in | Wt 115.0 lb

## 2016-09-27 DIAGNOSIS — I714 Abdominal aortic aneurysm, without rupture, unspecified: Secondary | ICD-10-CM

## 2016-09-27 DIAGNOSIS — E039 Hypothyroidism, unspecified: Secondary | ICD-10-CM | POA: Insufficient documentation

## 2016-09-27 DIAGNOSIS — N183 Chronic kidney disease, stage 3 unspecified: Secondary | ICD-10-CM

## 2016-09-27 DIAGNOSIS — D509 Iron deficiency anemia, unspecified: Secondary | ICD-10-CM | POA: Insufficient documentation

## 2016-09-27 DIAGNOSIS — E1121 Type 2 diabetes mellitus with diabetic nephropathy: Secondary | ICD-10-CM | POA: Insufficient documentation

## 2016-09-27 DIAGNOSIS — I5042 Chronic combined systolic (congestive) and diastolic (congestive) heart failure: Secondary | ICD-10-CM | POA: Insufficient documentation

## 2016-09-27 LAB — COMPREHENSIVE METABOLIC PANEL
ALBUMIN: 2.9 g/dL — AB (ref 3.5–5.0)
ALT: 16 U/L (ref 14–54)
AST: 29 U/L (ref 15–41)
Alkaline Phosphatase: 80 U/L (ref 38–126)
Anion gap: 9 (ref 5–15)
BUN: 33 mg/dL — AB (ref 6–20)
CHLORIDE: 97 mmol/L — AB (ref 101–111)
CO2: 25 mmol/L (ref 22–32)
CREATININE: 1.75 mg/dL — AB (ref 0.44–1.00)
Calcium: 8.3 mg/dL — ABNORMAL LOW (ref 8.9–10.3)
GFR, EST AFRICAN AMERICAN: 31 mL/min — AB (ref >60)
GFR, EST NON AFRICAN AMERICAN: 27 mL/min — AB (ref >60)
Glucose, Bld: 214 mg/dL — ABNORMAL HIGH (ref 65–99)
POTASSIUM: 3.6 mmol/L (ref 3.5–5.1)
SODIUM: 131 mmol/L — AB (ref 135–145)
TOTAL PROTEIN: 6.4 g/dL — AB (ref 6.5–8.1)
Total Bilirubin: 1.3 mg/dL — ABNORMAL HIGH (ref 0.3–1.2)

## 2016-09-27 LAB — CBC WITH DIFFERENTIAL/PLATELET
BASOS ABS: 0.1 10*3/uL (ref 0.0–0.1)
BASOS PCT: 2 %
EOS ABS: 0.1 10*3/uL (ref 0.0–0.7)
Eosinophils Relative: 2 %
HEMATOCRIT: 25 % — AB (ref 36.0–46.0)
HEMOGLOBIN: 8.7 g/dL — AB (ref 12.0–15.0)
Lymphocytes Relative: 30 %
Lymphs Abs: 1.6 10*3/uL (ref 0.7–4.0)
MCH: 32.3 pg (ref 26.0–34.0)
MCHC: 34.8 g/dL (ref 30.0–36.0)
MCV: 92.9 fL (ref 78.0–100.0)
Monocytes Absolute: 0.5 10*3/uL (ref 0.1–1.0)
Monocytes Relative: 9 %
NEUTROS ABS: 3.1 10*3/uL (ref 1.7–7.7)
NEUTROS PCT: 57 %
Platelets: 211 10*3/uL (ref 150–400)
RBC: 2.69 MIL/uL — ABNORMAL LOW (ref 3.87–5.11)
RDW: 17.9 % — ABNORMAL HIGH (ref 11.5–15.5)
WBC: 5.3 10*3/uL (ref 4.0–10.5)

## 2016-09-27 NOTE — Addendum Note (Signed)
Addended by: Burton Apley A on: 09/27/2016 09:55 AM   Modules accepted: Orders

## 2016-09-27 NOTE — Progress Notes (Signed)
Patient name: Michele Walters MRN: [id number] DOB: Aug 02, 1935 Sex: female   REASON FOR CONSULT:    5.5 cm abdominal aortic aneurysm. The consult is requested by the [facility] nursing center.   HPI:   Michele Walters is a pleasant 82 y.o. female,  Who was found to have an abdominal aortic aneurysm. The patient was hospitalized in July at [facility] with congestive heart failure. She is currently at rehabilitation at [facility]. She has had some right leg weakness and has had difficulty ambulating.  She denies any abdominal pain or back pain.  She apparently was having some problems with anemia and was needing a GI workup to rule out a source of bleeding but they were waiting for her to get stronger before proceeding with colonoscopy and upper endoscopy.  She has multiple medical comorbidities as discussed below, however portly she has diabetes, congestive heart failure, stage 3 chronic kidney disease, paroxysmal atrial fibrillation, and a long history of tobacco use. She is on Eliquis.  Past Medical History:  Diagnosis Date  . Allergy   . Anemia    iron def  . Cataract   . Chronic combined systolic (congestive) and diastolic (congestive) heart failure (HCC)   . Diabetes mellitus without complication (HCC)   . Dysphagia   . Hyperlipidemia   . Hypertension   . Muscle weakness   . Renal disorder    chroninc kidney disease  . Thyroid disease     Family History  Problem Relation Age of Onset  . Heart disease Mother   . Heart attack Mother        massive  . COPD Father   . Emphysema Father   . Down syndrome Brother    Her mother had an abdominal aortic aneurysm. She is unaware of any family history of premature cardiovascular disease.  SOCIAL HISTORY: She quit smoking 2 months ago. She had smoked 1 pack per day for many years. Social History   Social History  . Marital status: Single    Spouse name: N/A  . Number of children: N/A  . Years of education: N/A     Occupational History  . Retired    Social History Main Topics  . Smoking status: Former Smoker    Packs/day: 1.00    Years: 55.00    Types: Cigarettes    Quit date: 08/05/2016  . Smokeless tobacco: Never Used  . Alcohol use No  . Drug use: No  . Sexual activity: No   Other Topics Concern  . Not on file   Social History Narrative  . No narrative on file    No Known Allergies  No current outpatient prescriptions on file.   No current facility-administered medications for this visit.     REVIEW OF SYSTEMS:  [X]  denotes positive finding, [ ]  denotes negative finding Cardiac  Comments:  Chest pain or chest pressure:    Shortness of breath upon exertion: X   Short of breath when lying flat:    Irregular heart rhythm: X Paroxysmal atrial fibrillation.       Vascular    Pain in calf, thigh, or hip brought on by ambulation:    Pain in feet at night that wakes you up from your sleep:     Blood clot in your veins:    Leg swelling:  X Bilateral       Pulmonary    Oxygen at home:    Productive cough:  Wheezing:         Neurologic    Sudden weakness in arms or legs:     Sudden numbness in arms or legs:     Sudden onset of difficulty speaking or slurred speech:    Temporary loss of vision in one eye:     Problems with dizziness:         Gastrointestinal    Blood in stool:     Vomited blood:         Genitourinary    Burning when urinating:     Blood in urine:        Psychiatric    Major depression:         Hematologic    Bleeding problems:    Problems with blood clotting too easily:        Skin    Rashes or ulcers:        Constitutional    Fever or chills:     PHYSICAL EXAM:   Vitals:   09/27/16 0826  BP: 121/70  Pulse: 74  Resp: 20  Temp: (!) 97.1 F (36.2 C)  TempSrc: Oral  SpO2: 99%  Weight: 115 lb (52.2 kg)  Height: 5\' 2"  (1.575 m)    GENERAL: The patient is a well-nourished female, in no acute distress. The vital signs are documented  above. CARDIAC: There is an irregular rhythm.  VASCULAR: I do not detect carotid bruits. She has diminished femoral pulses. I cannot palpate popliteal or pedal pulses. She has moderate bilateral lower extremity swelling. PULMONARY: There is good air exchange bilaterally without wheezing or rales. ABDOMEN: Soft and non-tender with normal pitched bowel sounds. She has an easily palpable abdominal aortic aneurysm. MUSCULOSKELETAL: There are no major deformities or cyanosis. NEUROLOGIC: She has some right lower extremity weakness. SKIN: There are no ulcers or rashes noted. PSYCHIATRIC: The patient has a normal affect.  DATA:    LABS: Creatinine is 1.68. GFR is 28.  CT ABDOMEN PELVIS: This shows a 5.5 cm pararenal abdominal aortic aneurysm. The aneurysm extends up and involves the left renal artery. She has diffuse calcific disease involving her entire aorta and bilateral common iliac arteries and external iliac arteries. Common femoral arteries are diffusely calcified.  ECHO: The patient had a neck 01 08/05/2016 which showed an ejection fraction of 35-40%. The patient has moderate to severe left ventricular hypertrophy. Systolic function was moderately reduced. There was mild aortic stenosis.  VENOUS DUPLEX: The venous duplex can was done on 7-18 was reviewed. There was no evidence of DVT or superficial thrombophlebitis in either lower extremity.  MEDICAL ISSUES:   PARARENAL 5.5 CM ASYMPTOMATIC ABDOMINAL AORTIC ANEURYSM: I have explained to the patient and her family that this is a complicated situation. She has an asymptomatic 5.5 cm pararenal abdominal aortic aneurysm. I have explained that in a normal risk patient we would consider elective repair at 5.5 cm. She is clearly not normal risk given her age, history of congestive heart failure, anemia, diabetes, atrial fibrillation, and history of stage III chronic kidney disease.   Given that the aneurysm is pararenal she is clearly not a  candidate for a standard endovascular repair. In order to determine our options for repair she will need a CT angiogram of the abdomen and pelvis with 1 mm cuts. However, given her stage III chronic kidney disease she would require preprocedural renal evaluation to assess the risks and determine the best way to lower her risk of worsening renal insufficiency and  potentially even dialysis. If she does elect to proceed with this workup the CT angiogram will allow [place] to determine if she is a candidate for a fenestrated graft, the open aneurysm repair, or if she would require a thoracoabdominal repair.   I have had a long discussion with the patient and her family about the complexity of her aneurysm and her overall medical history. She understands that the risk of aneurysm rupture for an aneurysm of this size is 5-10% per year. She understands that the risk of the dressing this aneurysm the matter what approach we would use would be associated with significantly increased risk. She would like to think about whether or not she wants to pursue further workup.  If she does elect to proceed with CT angiogram and appears to be a candidate for repair, she will need her GI workup to be complete and would also need preoperative cardiac evaluation. If she required fenestrated graft repair or thoracoabdominal repair she would likely need referral to Dr. Pattricia Boss in [facility].   She will let us know what she has made a decision.  Waverly Ferrari [facility] [place] [phone]

## 2016-09-29 ENCOUNTER — Encounter (HOSPITAL_COMMUNITY)
Admission: RE | Admit: 2016-09-29 | Discharge: 2016-09-29 | Disposition: A | Discharge status: Home / Self Care | Payer: Medicare Other

## 2016-09-29 ENCOUNTER — Other Ambulatory Visit: Payer: Self-pay | Admitting: Nurse Practitioner

## 2016-09-29 DIAGNOSIS — I10 Essential (primary) hypertension: Secondary | ICD-10-CM

## 2016-09-29 LAB — OCCULT BLOOD X 1 CARD TO LAB, STOOL: Fecal Occult Bld: NEGATIVE

## 2016-10-01 ENCOUNTER — Encounter: Payer: Self-pay | Admitting: *Deleted

## 2016-10-01 ENCOUNTER — Non-Acute Institutional Stay (SKILLED_NURSING_FACILITY): Payer: Medicare Other | Admitting: Internal Medicine

## 2016-10-01 ENCOUNTER — Encounter (HOSPITAL_COMMUNITY)
Admission: RE | Admit: 2016-10-01 | Discharge: 2016-10-01 | Disposition: A | Discharge status: Home / Self Care | Payer: Medicare Other | Source: Skilled Nursing Facility | Attending: Internal Medicine | Admitting: Internal Medicine

## 2016-10-01 ENCOUNTER — Encounter: Payer: Self-pay | Admitting: Internal Medicine

## 2016-10-01 DIAGNOSIS — R9439 Abnormal result of other cardiovascular function study: Secondary | ICD-10-CM

## 2016-10-01 DIAGNOSIS — I714 Abdominal aortic aneurysm, without rupture, unspecified: Secondary | ICD-10-CM

## 2016-10-01 DIAGNOSIS — E1100 Type 2 diabetes mellitus with hyperosmolarity without nonketotic hyperglycemic-hyperosmolar coma (NKHHC): Secondary | ICD-10-CM | POA: Diagnosis not present

## 2016-10-01 DIAGNOSIS — N183 Chronic kidney disease, stage 3 unspecified: Secondary | ICD-10-CM

## 2016-10-01 DIAGNOSIS — D649 Anemia, unspecified: Secondary | ICD-10-CM | POA: Diagnosis not present

## 2016-10-01 DIAGNOSIS — E039 Hypothyroidism, unspecified: Secondary | ICD-10-CM

## 2016-10-01 DIAGNOSIS — M21371 Foot drop, right foot: Secondary | ICD-10-CM | POA: Diagnosis not present

## 2016-10-01 DIAGNOSIS — I719 Aortic aneurysm of unspecified site, without rupture: Secondary | ICD-10-CM | POA: Insufficient documentation

## 2016-10-01 DIAGNOSIS — I5042 Chronic combined systolic (congestive) and diastolic (congestive) heart failure: Secondary | ICD-10-CM | POA: Diagnosis not present

## 2016-10-01 DIAGNOSIS — I480 Paroxysmal atrial fibrillation: Secondary | ICD-10-CM

## 2016-10-01 LAB — BASIC METABOLIC PANEL
ANION GAP: 8 (ref 5–15)
BUN: 34 mg/dL — ABNORMAL HIGH (ref 6–20)
CO2: 26 mmol/L (ref 22–32)
Calcium: 8.3 mg/dL — ABNORMAL LOW (ref 8.9–10.3)
Chloride: 102 mmol/L (ref 101–111)
Creatinine, Ser: 1.81 mg/dL — ABNORMAL HIGH (ref 0.44–1.00)
GFR calc Af Amer: 30 mL/min — ABNORMAL LOW (ref >60)
GFR, EST NON AFRICAN AMERICAN: 25 mL/min — AB (ref >60)
GLUCOSE: 89 mg/dL (ref 65–99)
POTASSIUM: 4.1 mmol/L (ref 3.5–5.1)
Sodium: 136 mmol/L (ref 135–145)

## 2016-10-01 LAB — CBC WITH DIFFERENTIAL/PLATELET
BASOS ABS: 0.1 10*3/uL (ref 0.0–0.1)
BASOS PCT: 1 %
Eosinophils Absolute: 0.1 10*3/uL (ref 0.0–0.7)
Eosinophils Relative: 3 %
HEMATOCRIT: 22.4 % — AB (ref 36.0–46.0)
HEMOGLOBIN: 7.6 g/dL — AB (ref 12.0–15.0)
Lymphocytes Relative: 34 %
Lymphs Abs: 1.8 10*3/uL (ref 0.7–4.0)
MCH: 31.8 pg (ref 26.0–34.0)
MCHC: 33.9 g/dL (ref 30.0–36.0)
MCV: 93.7 fL (ref 78.0–100.0)
Monocytes Absolute: 0.6 10*3/uL (ref 0.1–1.0)
Monocytes Relative: 12 %
NEUTROS ABS: 2.7 10*3/uL (ref 1.7–7.7)
NEUTROS PCT: 50 %
Platelets: 191 10*3/uL (ref 150–400)
RBC: 2.39 MIL/uL — ABNORMAL LOW (ref 3.87–5.11)
RDW: 18.3 % — AB (ref 11.5–15.5)
WBC: 5.2 10*3/uL (ref 4.0–10.5)

## 2016-10-01 NOTE — Progress Notes (Signed)
Location:   [facility] Room Number: 132/P Place of Service:  SNF (31) Provider:  Marlow Baars, Mary-Margaret, FNP  Patient Care Team: Bennie Pierini, FNP as PCP - General (Nurse Practitioner) Derryl Harbor, OD as Consulting Physician [facility])  Extended Emergency Contact Information Primary Emergency Contact: Gibson,Reba Address: [address] [place], [place] [address] [place] of [place] Home Phone: [phone] Work Phone: [phone] Relation: Sister  Code Status:  Full Code Goals of care: Advanced Directive information Advanced Directives 10/01/2016  Does Patient Have a Medical Advance Directive? Yes  Type of Advance Directive (No Data)  Does patient want to make changes to medical advance directive? No - Patient declined  Copy of Healthcare Power of Attorney in Chart? -  Would patient like information on creating a medical advance directive? No - Patient declined     Chief Complaint  Patient presents with  . Acute Visit    Acute Visit    HPI:  Pt is a 81 y.o. female seen today for an acute visit for Worsening anemia  Patient has h/o Hypertension, Type2 Diabetes, Hyperlipidemia, hypothyroidism, recently quit smoking, CKD, newly diagnosed Heart Failure with EF of 35-40 %.And CAD with Positive NM imaging in 07/18 with plan for medical management unless her symptoms come back.  Patient was admitted to SNF after she was readmitted in the hospital in 08/18 with Hgb of 6.5. She did have 2 units of blood and was discharged for therapy. Her labs also showed she had increased indirect HGB. She has stayed Guaiac negative and has had no Overt bleeding. Since being in the facility she also developed right Foot drop. And had asymptomatic Infrarenal Aortic aneurysm of 5.5 cm. She also was diagnosed with Atrial fibrillation and was started on Coreg and Eliquis.  This morning her Hgb is 7.6 . It has been slowly drifting  down Has no Melena. No Blood PR. No Dysuria or dark urine. She is having weakness. No chest pain or SOB No Abdominal pain appetite good.  Past Medical History:  Diagnosis Date  . Allergy   . Anemia    iron def  . Cataract   . Chronic combined systolic (congestive) and diastolic (congestive) heart failure (HCC)   . Diabetes mellitus without complication (HCC)   . Dysphagia   . Hyperlipidemia   . Hypertension   . Muscle weakness   . Renal disorder    chroninc kidney disease  . Thyroid disease    Past Surgical History:  Procedure Laterality Date  . ABDOMINAL HYSTERECTOMY     partial but later ovaries removed  . EYE SURGERY Bilateral    cataract  . OOPHORECTOMY Bilateral     No Known Allergies  Outpatient Encounter Prescriptions as of 10/01/2016  Medication Sig  . apixaban (ELIQUIS) 2.5 MG TABS tablet Take 2.5 mg by mouth 2 (two) times daily.  . bisacodyl (DULCOLAX) 5 MG EC tablet Take 2 tablets (10 mg total) by mouth daily as needed for mild constipation or moderate constipation.  . calcium carbonate (OS-CAL) 600 MG TABS tablet Take 600 mg by mouth every other day.   . carvedilol (COREG) 12.5 MG tablet Take 12.5 mg by mouth 2 (two) times daily with a meal. 1 bid  . docusate sodium (COLACE) 100 MG capsule Take 2 capsules (200 mg total) by mouth 2 (two) times daily.  . hydrALAZINE (APRESOLINE) 50 MG tablet Take 1 tablet (50 mg total) by  mouth every 8 (eight) hours.  . isosorbide mononitrate (IMDUR) 30 MG 24 hr tablet Take 1 tablet (30 mg total) by mouth daily.  Marland Kitchen levothyroxine (SYNTHROID, LEVOTHROID) 88 MCG tablet Take 1 tablet (88 mcg total) by mouth daily before breakfast.  . Omega-3 Fatty Acids (FISH OIL) 1000 MG CAPS Take 1 capsule by mouth every other day.   . polyethylene glycol (MIRALAX / GLYCOLAX) packet Take 17 g by mouth daily.   No facility-administered encounter medications on file as of 10/01/2016.      Review of Systems  HENT: Negative.   Respiratory:  Negative.   Cardiovascular: Negative.   Gastrointestinal: Negative.   Genitourinary: Negative.   Musculoskeletal: Negative.   Skin: Negative.   Neurological: Positive for weakness. Negative for dizziness, light-headedness and headaches.  Psychiatric/Behavioral: Negative.     Immunization History  Administered Date(s) Administered  . Influenza, High Dose Seasonal PF 10/16/2012, 11/03/2013, 11/02/2014, 10/26/2015  . Pneumococcal Conjugate-13 07/29/2014  . Pneumococcal Polysaccharide-23 11/14/2008  . Zoster 03/14/2008   Pertinent  Health Maintenance Due  Topic Date Due  . FOOT EXAM  10/11/2016 (Originally 07/10/2016)  . OPHTHALMOLOGY EXAM  10/11/2016 (Originally 06/22/2016)  . DEXA SCAN  10/11/2016 (Originally 07/15/2016)  . INFLUENZA VACCINE  01/05/2017 (Originally 09/05/2016)  . URINE MICROALBUMIN  10/25/2016  . HEMOGLOBIN A1C  02/03/2017  . PNA vac Low Risk Adult  Completed   Fall Risk  08/17/2016 05/07/2016 04/23/2016 11/22/2015 11/15/2015  Falls in the past year? Yes Yes Yes No No  Number falls in past yr: 1 1 1  - -  Injury with Fall? No Yes No - -  Comment - Bruised chin and face. Laceration on lip. - - -  Risk for fall due to : - - - - -   Functional Status Survey:    There were no vitals filed for this visit. There is no height or weight on file to calculate BMI. Physical Exam  Constitutional: She is oriented to person, place, and time. She appears well-developed.  HENT:  Head: Normocephalic.  Mouth/Throat: Oropharynx is clear and moist.  Eyes: Pupils are equal, round, and reactive to light.  Neck: Neck supple.  Cardiovascular: Normal rate and normal heart sounds.  An irregular rhythm present.  No murmur heard. Pulmonary/Chest: Effort normal and breath sounds normal. No respiratory distress. She has no wheezes. She has no rales.  Abdominal: Soft. Bowel sounds are normal. She exhibits no distension. There is no tenderness. There is no rebound.  Musculoskeletal:  Trace  edema in Right LE.  Neurological: She is alert and oriented to person, place, and time.  Her strength in Right Leg is better then before but continues to be weaker Foot Flexion  then Left.  Skin: Skin is warm and dry.  Psychiatric: She has a normal mood and affect. Her behavior is normal. Thought content normal.    Labs reviewed:  Recent Labs  08/05/16 0621  09/24/16 0630 09/27/16 0911 10/01/16 0730  NA  --   < > 135 131* 136  K  --   < > 3.5 3.6 4.1  CL  --   < > 103 97* 102  CO2  --   < > 25 25 26   GLUCOSE  --   < > 109* 214* 89  BUN  --   < > 30* 33* 34*  CREATININE  --   < > 1.68* 1.75* 1.81*  CALCIUM  --   < > 8.0* 8.3* 8.3*  MG 1.7  --   --   --   --   < > =  values in this interval not displayed.  Recent Labs  09/13/16 1036 09/14/16 0200 09/20/16 0800 09/27/16 0911  AST 32  --  29 29  ALT 32  --  21 16  ALKPHOS 96  --  90 80  BILITOT 2.8* 2.6* 1.5* 1.3*  PROT 6.4*  --  5.8* 6.4*  ALBUMIN 2.7*  --  2.5* 2.9*    Recent Labs  09/20/16 0800 09/24/16 0630 09/27/16 0911 10/01/16 0730  WBC 4.9 4.9 5.3 5.2  NEUTROABS 2.7  --  3.1 2.7  HGB 9.7* 8.6* 8.7* 7.6*  HCT 28.2* 25.1* 25.0* 22.4*  MCV 91.3 91.3 92.9 93.7  PLT 183 206 211 191   Lab Results  Component Value Date   TSH 1.986 08/04/2016   Lab Results  Component Value Date   HGBA1C 4.8 08/04/2016   Lab Results  Component Value Date   CHOL 129 04/23/2016   HDL 45 04/23/2016   LDLCALC 62 04/23/2016   TRIG 108 04/23/2016   CHOLHDL 2.9 04/23/2016    Significant Diagnostic Results in last 30 days:  Ct Abdomen Pelvis Wo Contrast  Result Date: 09/19/2016 CLINICAL DATA:  Abdominal aortic aneurysm. EXAM: CT ABDOMEN AND PELVIS WITHOUT CONTRAST TECHNIQUE: Multidetector CT imaging of the abdomen and pelvis was performed following the standard protocol without IV contrast. COMPARISON:  None FINDINGS: Lower chest: Calcified granuloma identified in the right base. Bilateral lower lobe interstitial  reticulation and mild bronchiectasis is identified. No frank honeycombing. Hepatobiliary: No focal liver abnormality identified. Gallbladder appears normal. Pancreas: Unremarkable. No pancreatic ductal dilatation or surrounding inflammatory changes. Spleen: Normal in size without focal abnormality. Adrenals/Urinary Tract: The adrenal glands are normal. There are bilateral renal calcifications. Most of these are likely vascular in nature. No hydronephrosis. Urinary bladder appears normal. Stomach/Bowel: The stomach is normal. The small bowel loops have a normal course and caliber. No bowel obstruction. The colon is unremarkable. Vascular/Lymphatic: Aortic atherosclerosis. There is an infrarenal abdominal aortic aneurysm measuring 5.5 cm in AP dimension, image 69 of series 6. No enlarged retroperitoneal or mesenteric adenopathy. No enlarged pelvic or inguinal lymph nodes. Reproductive: Status post hysterectomy. No adnexal masses. Other: No abdominal wall hernia or abnormality. No abdominopelvic ascites. Musculoskeletal: There is a scoliosis deformity involving the lumbar spine which is convex to the left. Degenerative disc disease is identified within the lumbar spine. No aggressive lytic or sclerotic bone lesions. IMPRESSION: 1. No acute findings within the abdomen or pelvis. 2. Aortic aneurysm NOS (ICD10-I71.9). Recommend followup by abdomen and pelvis CTA in 3-6 months, and vascular surgery referral/consultation if not already obtained. This recommendation follows ACR consensus guidelines: White Paper of the ACR Incidental Findings Committee II on Vascular Findings. J Am Coll Radiol 2013; 10:789-794. 3. Chronic interstitial changes are identified in both lung bases including interstitial reticulation and bronchiectasis. Findings may reflect early usual interstitial pneumonia or nonspecific interstitial pneumonia. A followup high-resolution CT of the chest in 6 months is advised to assess for any temporal change.  Electronically Signed   By: Signa Kell M.D.   On: 09/19/2016 13:01   Dg Chest 2 View  Result Date: 09/04/2016 CLINICAL DATA:  Fall EXAM: CHEST  2 VIEW COMPARISON:  08/04/2016 FINDINGS: Chronic lung disease with hyperinflation and prominent lung markings bilaterally. No focal consolidation. Negative for heart failure or effusion. No acute fracture is identified. IMPRESSION: Chronic lung disease. No acute abnormality. Improved aeration since the prior study. Electronically Signed   By: Marlan Palau M.D.   On: 09/04/2016 16:02  Ct Head Wo Contrast  Result Date: 09/04/2016 CLINICAL DATA:  Generalized weakness. Vertigo and dizziness. 2 falls in the past month. Weight loss. EXAM: CT HEAD WITHOUT CONTRAST TECHNIQUE: Contiguous axial images were obtained from the base of the skull through the vertex without intravenous contrast. COMPARISON:  None. FINDINGS: Brain: Diffusely enlarged ventricles and subarachnoid spaces. Patchy white matter low density in both cerebral hemispheres. No intracranial hemorrhage, mass lesion or CT evidence of acute infarction. Vascular: No hyperdense vessel or unexpected calcification. Skull: Mild bilateral hyperostosis frontalis. Sinuses/Orbits: Bilateral lens implants. Unremarkable included paranasal sinuses. Other:  None. IMPRESSION: 1. No acute abnormality. 2. Mild to moderate diffuse cerebral and cerebellar atrophy. 3. Moderate chronic small vessel white matter ischemic changes in both cerebral hemispheres. Electronically Signed   By: Beckie Salts M.D.   On: 09/04/2016 16:21   US Abdomen Complete  Result Date: 09/19/2016 CLINICAL DATA:  Increased bilirubin, history hypertension, diabetes mellitus EXAM: ABDOMEN ULTRASOUND COMPLETE COMPARISON:  08/06/2016 renal ultrasound FINDINGS: Gallbladder: 2.2 cm diameter mildly hyperechoic nonshadowing focus within the gallbladder lumen likely a sludge ball. No discrete shadowing calculi identified. No gallbladder wall thickening,  pericholecystic fluid or sonographic Murphy sign. Common bile duct: Diameter: 4 mm diameter, normal Liver: Normal appearance.  Hepatopetal portal venous flow. IVC: Normal appearance Pancreas: Normal appearance Spleen: 9.9 cm length. Slightly rounded appearance without focal mass Right Kidney: Length: 9.8 cm. Borderline increased echogenicity without mass or hydronephrosis Left Kidney: Length: 10.2 cm. Minimally increased cortical echogenicity without mass or hydronephrosis Abdominal aorta: Aneurysmal dilatation of the mid to distal abdominal aorta measuring up to 5.0 cm AP at the mid aorta and 5.3 cm AP at the distal aorta. Transverse dimension is less well demonstrated but potentially 6 cm. Other findings: No free fluid IMPRESSION: Probable sludge ball within gallbladder without definite evidence of cholelithiasis or acute cholecystitis. Aneurysmal dilatation of the mid to distal abdominal aorta measuring 5.0 cm AP at mid aorta and 5.3 cm distally, with transverse dimensions potentially in excess of 6 cm. Vascular surgery consultation recommended due to increased risk of rupture for AAA >5.5 cm. This recommendation follows ACR consensus guidelines: White Paper of the ACR Incidental Findings Committee II on Vascular Findings. J Am Coll Radiol 2013; 10:789-794. Findings called to Vicky RN at [facility] on 09/19/2016 at 0948 hrs. Electronically Signed   By: Ulyses Southward M.D.   On: 09/19/2016 09:53    Assessment/Plan  Normocytic anemia Hgb is down to 7.6. She is still Occult Negative. Her reticulocyte in the hospital was high at 20% and her indirect bilirubin was high in the hospital. Iron studies show saturation of32 Will repeat CBC, Hepatic panel tomorrow. Also do Blood smear And LDH for Hemolysis. Can be Chronic Disease anemia Also Hematology Consult. Patient also has GI appointment for further work up of her anemia.  CHF Patient has gained almost 4 lbs in past few weeks..But is Asymptomatic She is off her  lasix as per hospital due to dehydration. Will wait for cardiology input. They are going to see her tomorrow.  Paroxysmal atrial fibrillation  Rate control on Coreg Also on Low dose of Eliquis  Type 2 diabetes mellitus  BS in the afternoon are running in 200 she is not on any Meds as her A1C in 06/18 was 4.8  Hypothyroidism TSH normal in 06/18  CKD (chronic kidney disease) stage 3, GFR 30- Creat at 1.89 Close to her baseline  Abnormal nuclear stress test Per cardiology Continue Medical management with Imdur and Hydralazine.  Not candidate for Cath due to renal Disease.  Infrarenal AAA of 5.5 cm Per Vascular she is high risk candidate for surgery due to Comorbidity and CKD. Patient family is going to wait and think about it for now.  Foot Drop Neurology pending. Patient doing well with therapy Plan for discharge home eventually.   Family/ staff Communication:   Labs/tests ordered:   Total time spent in this patient care encounter was 35_ minutes; greater than 50% of the visit spent counseling patient, reviewing records , Labs and coordinating care for problems addressed at this encounter.

## 2016-10-01 NOTE — Telephone Encounter (Signed)
This is not on her medication list- it is also not listed on meds from hospital discharge- have famliy contact cardiology and see if they still want her to take. She is on carvediol s that is probably what they want her on instead of atenolol

## 2016-10-02 ENCOUNTER — Encounter: Payer: Self-pay | Admitting: Internal Medicine

## 2016-10-02 ENCOUNTER — Non-Acute Institutional Stay (SKILLED_NURSING_FACILITY): Payer: Medicare Other | Admitting: Internal Medicine

## 2016-10-02 ENCOUNTER — Encounter (HOSPITAL_COMMUNITY)
Admission: RE | Admit: 2016-10-02 | Discharge: 2016-10-02 | Disposition: A | Discharge status: Home / Self Care | Payer: Medicare Other | Source: Skilled Nursing Facility | Attending: Internal Medicine | Admitting: Internal Medicine

## 2016-10-02 ENCOUNTER — Encounter (HOSPITAL_COMMUNITY): Payer: Medicare Other

## 2016-10-02 DIAGNOSIS — R2689 Other abnormalities of gait and mobility: Secondary | ICD-10-CM | POA: Diagnosis not present

## 2016-10-02 DIAGNOSIS — R9439 Abnormal result of other cardiovascular function study: Secondary | ICD-10-CM | POA: Diagnosis not present

## 2016-10-02 DIAGNOSIS — D649 Anemia, unspecified: Secondary | ICD-10-CM | POA: Diagnosis not present

## 2016-10-02 DIAGNOSIS — M21371 Foot drop, right foot: Secondary | ICD-10-CM | POA: Diagnosis not present

## 2016-10-02 DIAGNOSIS — E039 Hypothyroidism, unspecified: Secondary | ICD-10-CM | POA: Insufficient documentation

## 2016-10-02 DIAGNOSIS — I5042 Chronic combined systolic (congestive) and diastolic (congestive) heart failure: Secondary | ICD-10-CM

## 2016-10-02 DIAGNOSIS — E1140 Type 2 diabetes mellitus with diabetic neuropathy, unspecified: Secondary | ICD-10-CM | POA: Diagnosis not present

## 2016-10-02 DIAGNOSIS — I4891 Unspecified atrial fibrillation: Secondary | ICD-10-CM | POA: Diagnosis not present

## 2016-10-02 DIAGNOSIS — I714 Abdominal aortic aneurysm, without rupture: Secondary | ICD-10-CM | POA: Insufficient documentation

## 2016-10-02 DIAGNOSIS — D509 Iron deficiency anemia, unspecified: Secondary | ICD-10-CM | POA: Insufficient documentation

## 2016-10-02 DIAGNOSIS — E1121 Type 2 diabetes mellitus with diabetic nephropathy: Secondary | ICD-10-CM | POA: Insufficient documentation

## 2016-10-02 DIAGNOSIS — I6980 Unspecified sequelae of other cerebrovascular disease: Secondary | ICD-10-CM | POA: Diagnosis not present

## 2016-10-02 LAB — BASIC METABOLIC PANEL
ANION GAP: 7 (ref 5–15)
BUN: 31 mg/dL — ABNORMAL HIGH (ref 6–20)
CO2: 25 mmol/L (ref 22–32)
Calcium: 8.1 mg/dL — ABNORMAL LOW (ref 8.9–10.3)
Chloride: 105 mmol/L (ref 101–111)
Creatinine, Ser: 1.91 mg/dL — ABNORMAL HIGH (ref 0.44–1.00)
GFR calc Af Amer: 28 mL/min — ABNORMAL LOW (ref >60)
GFR calc non Af Amer: 24 mL/min — ABNORMAL LOW (ref >60)
GLUCOSE: 102 mg/dL — AB (ref 65–99)
POTASSIUM: 4 mmol/L (ref 3.5–5.1)
Sodium: 137 mmol/L (ref 135–145)

## 2016-10-02 LAB — CBC WITH DIFFERENTIAL/PLATELET
BASOS ABS: 0.1 10*3/uL (ref 0.0–0.1)
Basophils Relative: 2 %
Eosinophils Absolute: 0.1 10*3/uL (ref 0.0–0.7)
Eosinophils Relative: 2 %
HEMATOCRIT: 21.8 % — AB (ref 36.0–46.0)
Hemoglobin: 7.4 g/dL — ABNORMAL LOW (ref 12.0–15.0)
LYMPHS PCT: 36 %
Lymphs Abs: 1.6 10*3/uL (ref 0.7–4.0)
MCH: 31.9 pg (ref 26.0–34.0)
MCHC: 33.9 g/dL (ref 30.0–36.0)
MCV: 94 fL (ref 78.0–100.0)
Monocytes Absolute: 0.4 10*3/uL (ref 0.1–1.0)
Monocytes Relative: 9 %
NEUTROS ABS: 2.4 10*3/uL (ref 1.7–7.7)
Neutrophils Relative %: 51 %
Platelets: 180 10*3/uL (ref 150–400)
RBC: 2.32 MIL/uL — AB (ref 3.87–5.11)
RDW: 18.4 % — ABNORMAL HIGH (ref 11.5–15.5)
WBC: 4.6 10*3/uL (ref 4.0–10.5)

## 2016-10-02 LAB — HEPATIC FUNCTION PANEL
ALT: 13 U/L — AB (ref 14–54)
AST: 21 U/L (ref 15–41)
Albumin: 2.6 g/dL — ABNORMAL LOW (ref 3.5–5.0)
Alkaline Phosphatase: 72 U/L (ref 38–126)
BILIRUBIN DIRECT: 0.4 mg/dL (ref 0.1–0.5)
BILIRUBIN TOTAL: 1.2 mg/dL (ref 0.3–1.2)
Indirect Bilirubin: 0.8 mg/dL (ref 0.3–0.9)
Total Protein: 5.7 g/dL — ABNORMAL LOW (ref 6.5–8.1)

## 2016-10-02 LAB — HEMOGLOBIN A1C
Hgb A1c MFr Bld: 5 % (ref 4.8–5.6)
Mean Plasma Glucose: 96.8 mg/dL

## 2016-10-02 LAB — HEMOGLOBIN AND HEMATOCRIT, BLOOD
HEMATOCRIT: 23.1 % — AB (ref 36.0–46.0)
Hemoglobin: 8.1 g/dL — ABNORMAL LOW (ref 12.0–15.0)

## 2016-10-02 LAB — LACTATE DEHYDROGENASE: LDH: 208 U/L — AB (ref 98–192)

## 2016-10-02 NOTE — Progress Notes (Signed)
Location:    [facility] Room Number: 132/P Place of Service:  SNF (31) Provider:  Marlow Baars, Mary-Margaret, FNP  Patient Care Team: Bennie Pierini, FNP as PCP - General (Nurse Practitioner) [place]l [place], OD as Consulting Physician [facility])  Extended Emergency Contact Information Primary Emergency Contact: Gibson,Reba Address: [address] [place], [place] [address] D[place] of [place] Home Phone: [phone] Work Phone: [phone] Relation: Sister  Code Status:  Full Code Goals of care: Advanced Directive information Advanced Directives 10/02/2016  Does Patient Have a Medical Advance Directive? Yes  Type of Advance Directive (No Data)  Does patient want to make changes to medical advance directive? No - Patient declined  Copy of Healthcare Power of Attorney in Chart? -  Would patient like information on creating a medical advance directive? No - Patient declined     Chief Complaint  Patient presents with  . Acute Visit    Anemia    HPI:  Pt is a 81 y.o. female seen today for an acute visit for Follow up of her Anemia and discharge planning.  Patient has h/o Hypertension, Type2 Diabetes, Hyperlipidemia, hypothyroidism, recently quit smoking, CKD, newly diagnosed Heart Failure with EF of 35-40 %.And CAD with Positive NM imaging in 07/18 with plan for medical management unless her symptoms come back. Patient is also recently diagnosed with AAA infrarenal Aneurysm 5.5 Cm. Atrial Fibrillation, And foot drop.  Patient wants to go home and  Her sister is arranging for her to have help at home. Her Hgb today is 7.4. She continues to stay asymptomatic with no Chest pain or SOB. Has no Melena. No Blood PR. No Dysuria or dark urine. She is having weakness No Abdominal pain appetite good.  Past Medical History:  Diagnosis Date  . Allergy   . Anemia    iron def  . Cataract   . Chronic combined systolic (congestive)  and diastolic (congestive) heart failure (HCC)   . Diabetes mellitus without complication (HCC)   . Dysphagia   . Hyperlipidemia   . Hypertension   . Muscle weakness   . Renal disorder    chroninc kidney disease  . Thyroid disease    Past Surgical History:  Procedure Laterality Date  . ABDOMINAL HYSTERECTOMY     partial but later ovaries removed  . EYE SURGERY Bilateral    cataract  . OOPHORECTOMY Bilateral     No Known Allergies  Outpatient Encounter Prescriptions as of 10/02/2016  Medication Sig  . apixaban (ELIQUIS) 2.5 MG TABS tablet Take 2.5 mg by mouth 2 (two) times daily.  . bisacodyl (DULCOLAX) 5 MG EC tablet Take 2 tablets (10 mg total) by mouth daily as needed for mild constipation or moderate constipation.  . calcium carbonate (OS-CAL) 600 MG TABS tablet Take 600 mg by mouth every other day.   . carvedilol (COREG) 12.5 MG tablet Take 12.5 mg by mouth 2 (two) times daily with a meal. 1 bid  . docusate sodium (COLACE) 100 MG capsule Take 2 capsules (200 mg total) by mouth 2 (two) times daily.  . hydrALAZINE (APRESOLINE) 50 MG tablet Take 1 tablet (50 mg total) by mouth every 8 (eight) hours.  . isosorbide mononitrate (IMDUR) 30 MG 24 hr tablet Take 1 tablet (30 mg total) by mouth daily.  Marland Kitchen levothyroxine (SYNTHROID, LEVOTHROID) 88 MCG tablet Take 1 tablet (88 mcg total) by mouth daily before breakfast.  . Omega-3 Fatty  Acids (FISH OIL) 1000 MG CAPS Take 1 capsule by mouth every other day.   . polyethylene glycol (MIRALAX / GLYCOLAX) packet Take 17 g by mouth daily.   No facility-administered encounter medications on file as of 10/02/2016.      Review of Systems  HENT: Negative.   Respiratory: Negative.   Cardiovascular: Positive for chest pain, palpitations and leg swelling.  Gastrointestinal: Negative.   Genitourinary: Negative.   Musculoskeletal: Negative.   Skin: Negative.   Neurological: Positive for weakness. Negative for dizziness and light-headedness.    Psychiatric/Behavioral: Negative.     Immunization History  Administered Date(s) Administered  . Influenza, High Dose Seasonal PF 10/16/2012, 11/03/2013, 11/02/2014, 10/26/2015  . Pneumococcal Conjugate-13 07/29/2014  . Pneumococcal Polysaccharide-23 11/14/2008  . Zoster 03/14/2008   Pertinent  Health Maintenance Due  Topic Date Due  . FOOT EXAM  10/11/2016 (Originally 07/10/2016)  . OPHTHALMOLOGY EXAM  10/11/2016 (Originally 06/22/2016)  . DEXA SCAN  10/11/2016 (Originally 07/15/2016)  . INFLUENZA VACCINE  01/05/2017 (Originally 09/05/2016)  . URINE MICROALBUMIN  10/25/2016  . HEMOGLOBIN A1C  02/03/2017  . PNA vac Low Risk Adult  Completed   Fall Risk  08/17/2016 05/07/2016 04/23/2016 11/22/2015 11/15/2015  Falls in the past year? Yes Yes Yes No No  Number falls in past yr: 1 1 1  - -  Injury with Fall? No Yes No - -  Comment - Bruised chin and face. Laceration on lip. - - -  Risk for fall due to : - - - - -   Functional Status Survey:    Vitals:   10/02/16 1327  BP: (!) 96/59  Pulse: 69  Resp: 18  Temp: (!) 97.2 F (36.2 C)  TempSrc: Oral   There is no height or weight on file to calculate BMI. Physical Exam  Constitutional: She appears well-developed and well-nourished.  HENT:  Head: Normocephalic.  Mouth/Throat: Oropharynx is clear and moist.  Eyes: Pupils are equal, round, and reactive to light.  Neck: Normal range of motion.  Cardiovascular: Normal rate and normal heart sounds.   Pulmonary/Chest: Effort normal and breath sounds normal. No respiratory distress. She has no wheezes. She has no rales.  Abdominal: Soft. Bowel sounds are normal. She exhibits no distension. There is no tenderness. There is no rebound.  Musculoskeletal: She exhibits edema.  Moderate edema   Neurological: She is alert.  Skin: Skin is warm and dry.  Psychiatric: She has a normal mood and affect. Her behavior is normal.    Labs reviewed:  Recent Labs  08/05/16 0621  09/27/16 0911  10/01/16 0730 10/02/16 0714  NA  --   < > 131* 136 137  K  --   < > 3.6 4.1 4.0  CL  --   < > 97* 102 105  CO2  --   < > 25 26 25   GLUCOSE  --   < > 214* 89 102*  BUN  --   < > 33* 34* 31*  CREATININE  --   < > 1.75* 1.81* 1.91*  CALCIUM  --   < > 8.3* 8.3* 8.1*  MG 1.7  --   --   --   --   < > = values in this interval not displayed.  Recent Labs  09/20/16 0800 09/27/16 0911 10/02/16 0714  AST 29 29 21   ALT 21 16 13*  ALKPHOS 90 80 72  BILITOT 1.5* 1.3* 1.2  PROT 5.8* 6.4* 5.7*  ALBUMIN 2.5* 2.9* 2.6*  Recent Labs  09/27/16 0911 10/01/16 0730 10/02/16 0714  WBC 5.3 5.2 4.6  NEUTROABS 3.1 2.7 2.4  HGB 8.7* 7.6* 7.4*  HCT 25.0* 22.4* 21.8*  MCV 92.9 93.7 94.0  PLT 211 191 180   Lab Results  Component Value Date   TSH 1.986 08/04/2016   Lab Results  Component Value Date   HGBA1C 5.0 10/02/2016   Lab Results  Component Value Date   CHOL 129 04/23/2016   HDL 45 04/23/2016   LDLCALC 62 04/23/2016   TRIG 108 04/23/2016   CHOLHDL 2.9 04/23/2016    Significant Diagnostic Results in last 30 days:  Ct Abdomen Pelvis Wo Contrast  Result Date: 09/19/2016 CLINICAL DATA:  Abdominal aortic aneurysm. EXAM: CT ABDOMEN AND PELVIS WITHOUT CONTRAST TECHNIQUE: Multidetector CT imaging of the abdomen and pelvis was performed following the standard protocol without IV contrast. COMPARISON:  None FINDINGS: Lower chest: Calcified granuloma identified in the right base. Bilateral lower lobe interstitial reticulation and mild bronchiectasis is identified. No frank honeycombing. Hepatobiliary: No focal liver abnormality identified. Gallbladder appears normal. Pancreas: Unremarkable. No pancreatic ductal dilatation or surrounding inflammatory changes. Spleen: Normal in size without focal abnormality. Adrenals/Urinary Tract: The adrenal glands are normal. There are bilateral renal calcifications. Most of these are likely vascular in nature. No hydronephrosis. Urinary bladder appears  normal. Stomach/Bowel: The stomach is normal. The small bowel loops have a normal course and caliber. No bowel obstruction. The colon is unremarkable. Vascular/Lymphatic: Aortic atherosclerosis. There is an infrarenal abdominal aortic aneurysm measuring 5.5 cm in AP dimension, image 69 of series 6. No enlarged retroperitoneal or mesenteric adenopathy. No enlarged pelvic or inguinal lymph nodes. Reproductive: Status post hysterectomy. No adnexal masses. Other: No abdominal wall hernia or abnormality. No abdominopelvic ascites. Musculoskeletal: There is a scoliosis deformity involving the lumbar spine which is convex to the left. Degenerative disc disease is identified within the lumbar spine. No aggressive lytic or sclerotic bone lesions. IMPRESSION: 1. No acute findings within the abdomen or pelvis. 2. Aortic aneurysm NOS (ICD10-I71.9). Recommend followup by abdomen and pelvis CTA in 3-6 months, and vascular surgery referral/consultation if not already obtained. This recommendation follows ACR consensus guidelines: White Paper of the ACR Incidental Findings Committee II on Vascular Findings. J Am Coll Radiol 2013; 10:789-794. 3. Chronic interstitial changes are identified in both lung bases including interstitial reticulation and bronchiectasis. Findings may reflect early usual interstitial pneumonia or nonspecific interstitial pneumonia. A followup high-resolution CT of the chest in 6 months is advised to assess for any temporal change. Electronically Signed   By: Signa Kell M.D.   On: 09/19/2016 13:01   Dg Chest 2 View  Result Date: 09/04/2016 CLINICAL DATA:  Fall EXAM: CHEST  2 VIEW COMPARISON:  08/04/2016 FINDINGS: Chronic lung disease with hyperinflation and prominent lung markings bilaterally. No focal consolidation. Negative for heart failure or effusion. No acute fracture is identified. IMPRESSION: Chronic lung disease. No acute abnormality. Improved aeration since the prior study. Electronically  Signed   By: Marlan Palau M.D.   On: 09/04/2016 16:02   Ct Head Wo Contrast  Result Date: 09/04/2016 CLINICAL DATA:  Generalized weakness. Vertigo and dizziness. 2 falls in the past month. Weight loss. EXAM: CT HEAD WITHOUT CONTRAST TECHNIQUE: Contiguous axial images were obtained from the base of the skull through the vertex without intravenous contrast. COMPARISON:  None. FINDINGS: Brain: Diffusely enlarged ventricles and subarachnoid spaces. Patchy white matter low density in both cerebral hemispheres. No intracranial hemorrhage, mass lesion or CT evidence  of acute infarction. Vascular: No hyperdense vessel or unexpected calcification. Skull: Mild bilateral hyperostosis frontalis. Sinuses/Orbits: Bilateral lens implants. Unremarkable included paranasal sinuses. Other:  None. IMPRESSION: 1. No acute abnormality. 2. Mild to moderate diffuse cerebral and cerebellar atrophy. 3. Moderate chronic small vessel white matter ischemic changes in both cerebral hemispheres. Electronically Signed   By: Beckie Salts M.D.   On: 09/04/2016 16:21   US Abdomen Complete  Result Date: 09/19/2016 CLINICAL DATA:  Increased bilirubin, history hypertension, diabetes mellitus EXAM: ABDOMEN ULTRASOUND COMPLETE COMPARISON:  08/06/2016 renal ultrasound FINDINGS: Gallbladder: 2.2 cm diameter mildly hyperechoic nonshadowing focus within the gallbladder lumen likely a sludge ball. No discrete shadowing calculi identified. No gallbladder wall thickening, pericholecystic fluid or sonographic Murphy sign. Common bile duct: Diameter: 4 mm diameter, normal Liver: Normal appearance.  Hepatopetal portal venous flow. IVC: Normal appearance Pancreas: Normal appearance Spleen: 9.9 cm length. Slightly rounded appearance without focal mass Right Kidney: Length: 9.8 cm. Borderline increased echogenicity without mass or hydronephrosis Left Kidney: Length: 10.2 cm. Minimally increased cortical echogenicity without mass or hydronephrosis Abdominal  aorta: Aneurysmal dilatation of the mid to distal abdominal aorta measuring up to 5.0 cm AP at the mid aorta and 5.3 cm AP at the distal aorta. Transverse dimension is less well demonstrated but potentially 6 cm. Other findings: No free fluid IMPRESSION: Probable sludge ball within gallbladder without definite evidence of cholelithiasis or acute cholecystitis. Aneurysmal dilatation of the mid to distal abdominal aorta measuring 5.0 cm AP at mid aorta and 5.3 cm distally, with transverse dimensions potentially in excess of 6 cm. Vascular surgery consultation recommended due to increased risk of rupture for AAA >5.5 cm. This recommendation follows ACR consensus guidelines: White Paper of the ACR Incidental Findings Committee II on Vascular Findings. J Am Coll Radiol 2013; 10:789-794. Findings called to Vicky RN at [facility] on 09/19/2016 at 0948 hrs. Electronically Signed   By: Ulyses Southward M.D.   On: 09/19/2016 09:53    Assessment/Plan  Normocytic anemia Hgb is down to 7.4. She is still Occult Negative. Her reticulocyte in the hospital was high at 20% and her indirect bilirubin was high in the hospital. Iron studies show saturation of 32 Hepatic panel was normal LDH elevated.  With her h/o CAD and Abnormal Nuclear test will give her 2 units of PRBC. Tomorrow with Lasix. Follow up CBC Patient has appointment with GI and hematology.  CHF Patient has gained almost 4 lbs in past few weeks..But is Asymptomatic She is off her lasix as per hospital due to dehydration. She will get Lasix with her Transfusion tomorrow.  Hypotension Will Hold her Hydralazine for now.She was started on that for CHF.  Foot Drop Was evaluated by Neurology today and she is getting Cast by therapy. They are also going to do MRI of Head and Spine.   Paroxysmal atrial fibrillation  Rate control on Coreg Also on Low dose of Eliquis  Type 2 diabetes mellitus  BS in the afternoon are running in 200 she is not on any Meds as  her A1C in 06/18 was 4.8 Check Accu check Q AM.  Hypothyroidism TSH normal in 06/18  CKD (chronic kidney disease) stage 3, GFR 30- Creat at 1.91 Close to her baseline  Abnormal nuclear stress test Per cardiology Continue Medical management with Imdur  Not candidate for Cath due to renal Disease. Transfusion will help.  Infrarenal AAA of 5.5 cm Per Vascular she is high risk candidate for surgery due to Comorbidity and CKD. Patient  family is going to wait and think about it for now.  Discharge Planning Patient will be discharged home per her sister. She need close work up of her Anemia   Family/ staff Communication:   Labs/tests ordered:  CBC and Bmp on thurs. Total time spent in this patient care encounter was 45_ minutes; greater than 50% of the visit spent counseling patient, reviewing records , Labs and coordinating care for problems addressed at this encounter.

## 2016-10-03 ENCOUNTER — Encounter (INDEPENDENT_AMBULATORY_CARE_PROVIDER_SITE_OTHER): Payer: Self-pay

## 2016-10-03 ENCOUNTER — Ambulatory Visit: Payer: Medicare Other | Admitting: Cardiology

## 2016-10-03 ENCOUNTER — Encounter (INDEPENDENT_AMBULATORY_CARE_PROVIDER_SITE_OTHER): Payer: Self-pay | Admitting: Internal Medicine

## 2016-10-03 ENCOUNTER — Encounter (HOSPITAL_COMMUNITY)
Admit: 2016-10-03 | Discharge: 2016-10-03 | Disposition: A | Discharge status: Home / Self Care | Payer: Medicare Other | Attending: Internal Medicine | Admitting: Internal Medicine

## 2016-10-03 MED ORDER — SODIUM CHLORIDE 0.9 % IV SOLN
Freq: Once | INTRAVENOUS | Status: AC
  Administered 2016-10-03: 250 mL via INTRAVENOUS

## 2016-10-03 MED ORDER — FUROSEMIDE 10 MG/ML IJ SOLN
20.0000 mg | Freq: Once | INTRAMUSCULAR | Status: AC
  Administered 2016-10-03: 20 mg via INTRAVENOUS
  Filled 2016-10-03: qty 4

## 2016-10-04 ENCOUNTER — Encounter (HOSPITAL_COMMUNITY)
Admission: RE | Admit: 2016-10-04 | Discharge: 2016-10-04 | Disposition: A | Discharge status: Home / Self Care | Payer: Medicare Other | Source: Skilled Nursing Facility | Attending: Internal Medicine | Admitting: Internal Medicine

## 2016-10-04 DIAGNOSIS — I714 Abdominal aortic aneurysm, without rupture: Secondary | ICD-10-CM | POA: Insufficient documentation

## 2016-10-04 DIAGNOSIS — E1121 Type 2 diabetes mellitus with diabetic nephropathy: Secondary | ICD-10-CM | POA: Insufficient documentation

## 2016-10-04 DIAGNOSIS — I5042 Chronic combined systolic (congestive) and diastolic (congestive) heart failure: Secondary | ICD-10-CM | POA: Insufficient documentation

## 2016-10-04 DIAGNOSIS — I10 Essential (primary) hypertension: Secondary | ICD-10-CM | POA: Insufficient documentation

## 2016-10-04 DIAGNOSIS — E039 Hypothyroidism, unspecified: Secondary | ICD-10-CM | POA: Insufficient documentation

## 2016-10-04 DIAGNOSIS — D509 Iron deficiency anemia, unspecified: Secondary | ICD-10-CM | POA: Insufficient documentation

## 2016-10-04 LAB — TYPE AND SCREEN
ABO/RH(D): AB POS
Antibody Screen: POSITIVE
DAT, IGG: POSITIVE
UNIT DIVISION: 0
UNIT DIVISION: 0

## 2016-10-04 LAB — BPAM RBC
BLOOD PRODUCT EXPIRATION DATE: 201809242359
Blood Product Expiration Date: 201809242359
ISSUE DATE / TIME: 201808291248
ISSUE DATE / TIME: 201808291044
UNIT TYPE AND RH: 5100
Unit Type and Rh: 6200

## 2016-10-04 LAB — CBC
HCT: 30.1 % — ABNORMAL LOW (ref 36.0–46.0)
HEMOGLOBIN: 10.4 g/dL — AB (ref 12.0–15.0)
MCH: 30.9 pg (ref 26.0–34.0)
MCHC: 34.6 g/dL (ref 30.0–36.0)
MCV: 89.3 fL (ref 78.0–100.0)
PLATELETS: 169 10*3/uL (ref 150–400)
RBC: 3.37 MIL/uL — AB (ref 3.87–5.11)
RDW: 18.3 % — ABNORMAL HIGH (ref 11.5–15.5)
WBC: 5.9 10*3/uL (ref 4.0–10.5)

## 2016-10-08 ENCOUNTER — Other Ambulatory Visit (HOSPITAL_COMMUNITY)
Admission: RE | Admit: 2016-10-08 | Discharge: 2016-10-08 | Disposition: A | Discharge status: Home / Self Care | Payer: Medicare Other | Source: Skilled Nursing Facility | Attending: Internal Medicine | Admitting: Internal Medicine

## 2016-10-08 DIAGNOSIS — D520 Dietary folate deficiency anemia: Secondary | ICD-10-CM | POA: Insufficient documentation

## 2016-10-08 LAB — OCCULT BLOOD X 1 CARD TO LAB, STOOL: Fecal Occult Bld: NEGATIVE

## 2016-10-10 ENCOUNTER — Other Ambulatory Visit (HOSPITAL_COMMUNITY): Payer: Medicare Other

## 2016-10-10 ENCOUNTER — Ambulatory Visit (HOSPITAL_COMMUNITY)
Admit: 2016-10-10 | Discharge: 2016-10-10 | Disposition: A | Discharge status: Home / Self Care | Payer: Medicare Other | Attending: Neurology | Admitting: Neurology

## 2016-10-10 DIAGNOSIS — I6782 Cerebral ischemia: Secondary | ICD-10-CM | POA: Diagnosis not present

## 2016-10-10 DIAGNOSIS — G319 Degenerative disease of nervous system, unspecified: Secondary | ICD-10-CM | POA: Diagnosis not present

## 2016-10-10 DIAGNOSIS — M21371 Foot drop, right foot: Secondary | ICD-10-CM | POA: Insufficient documentation

## 2016-10-10 DIAGNOSIS — R29810 Facial weakness: Secondary | ICD-10-CM | POA: Diagnosis not present

## 2016-10-10 DIAGNOSIS — M549 Dorsalgia, unspecified: Secondary | ICD-10-CM | POA: Diagnosis not present

## 2016-10-10 DIAGNOSIS — R9082 White matter disease, unspecified: Secondary | ICD-10-CM | POA: Diagnosis not present

## 2016-10-10 NOTE — Telephone Encounter (Signed)
Pt is at and under the care of [facility]. Michele HawkingAnnie Penn I called and spoke with her nurse and she states that the pt is NOT taking this med

## 2016-10-11 ENCOUNTER — Ambulatory Visit (HOSPITAL_COMMUNITY)
Admit: 2016-10-11 | Discharge: 2016-10-11 | Disposition: A | Discharge status: Home / Self Care | Payer: Medicare Other | Attending: Neurology | Admitting: Neurology

## 2016-10-11 DIAGNOSIS — G959 Disease of spinal cord, unspecified: Secondary | ICD-10-CM | POA: Insufficient documentation

## 2016-10-11 DIAGNOSIS — M5134 Other intervertebral disc degeneration, thoracic region: Secondary | ICD-10-CM | POA: Diagnosis not present

## 2016-10-11 DIAGNOSIS — M2578 Osteophyte, vertebrae: Secondary | ICD-10-CM | POA: Diagnosis not present

## 2016-10-11 DIAGNOSIS — M4802 Spinal stenosis, cervical region: Secondary | ICD-10-CM | POA: Diagnosis not present

## 2016-10-11 DIAGNOSIS — R269 Unspecified abnormalities of gait and mobility: Secondary | ICD-10-CM | POA: Diagnosis not present

## 2016-10-11 DIAGNOSIS — M542 Cervicalgia: Secondary | ICD-10-CM | POA: Diagnosis not present

## 2016-10-11 DIAGNOSIS — R29810 Facial weakness: Secondary | ICD-10-CM | POA: Diagnosis not present

## 2016-10-11 DIAGNOSIS — M549 Dorsalgia, unspecified: Secondary | ICD-10-CM | POA: Diagnosis not present

## 2016-10-18 ENCOUNTER — Ambulatory Visit (INDEPENDENT_AMBULATORY_CARE_PROVIDER_SITE_OTHER): Payer: Medicare Other | Admitting: Internal Medicine

## 2016-10-18 ENCOUNTER — Encounter (INDEPENDENT_AMBULATORY_CARE_PROVIDER_SITE_OTHER): Payer: Self-pay | Admitting: Internal Medicine

## 2016-10-18 VITALS — BP 98/60 | HR 64 | Temp 97.4°F | Ht 62.0 in | Wt 117.3 lb

## 2016-10-18 DIAGNOSIS — D649 Anemia, unspecified: Secondary | ICD-10-CM

## 2016-10-18 LAB — CBC WITH DIFFERENTIAL/PLATELET
BASOS PCT: 1.3 %
Basophils Absolute: 83 cells/uL (ref 0–200)
EOS ABS: 198 {cells}/uL (ref 15–500)
Eosinophils Relative: 3.1 %
HEMATOCRIT: 25.9 % — AB (ref 35.0–45.0)
Hemoglobin: 8.9 g/dL — ABNORMAL LOW (ref 11.7–15.5)
LYMPHS ABS: 2464 {cells}/uL (ref 850–3900)
MCH: 31.3 pg (ref 27.0–33.0)
MCHC: 34.4 g/dL (ref 32.0–36.0)
MCV: 91.2 fL (ref 80.0–100.0)
MPV: 8.8 fL (ref 7.5–12.5)
Monocytes Relative: 8.6 %
NEUTROS PCT: 48.5 %
Neutro Abs: 3104 cells/uL (ref 1500–7800)
PLATELETS: 195 10*3/uL (ref 140–400)
RBC: 2.84 10*6/uL — ABNORMAL LOW (ref 3.80–5.10)
RDW: 15.5 % — ABNORMAL HIGH (ref 11.0–15.0)
TOTAL LYMPHOCYTE: 38.5 %
WBC: 6.4 10*3/uL (ref 3.8–10.8)
WBCMIX: 550 {cells}/uL (ref 200–950)

## 2016-10-18 NOTE — Progress Notes (Addendum)
Subjective:    Patient ID: Michele Walters, female    DOB: 04-Sep-1935, 81 y.o.   MRN: [id number] PCP Ronnald Collum. NP-C.   Rehab at [facility]. Has foot drop HPI  Referred by Granville Lewis PA-C for anemia. Hx of same.  Hemogobin 10/04/2016 10.4.  Had a blood transfusion over a week ago.  She denies seeing any rectal bleeding or melena. Appetite is good. No weight loss. Usually has a BM daily  Hx of atrial fib and maintained on Eliquis. Hx of CHF, DM, CKD stage 3  09/04/2016 Ferritin  310, Iron 88, TIBC 277, Sat 32, UIBC 189 ;09/04/2016 Hemoglobin  6.5  Requiring 2 units of PRBCs 09/17/2016 Hemoccult negative.  04/27/2008 Colonoscopy Dr. Jim Desanctis  FINDINGS:  Diverticulosis of the sigmoid colon, moderate.  Internal  hemorrhoids, moderate in size.  A small polyp of the descending colon  and a slightly larger polyp of the splenic flexure area removed.  1. SPLENIC FLEXURE COLON, POLYP: TUBULAR ADENOMA. NO HIGH GRADE DYSPLASIA OR MALIGNANCY IDENTIFIED.  2. DESCENDING COLON, POLYP: TUBULAR ADENOMA. NO HIGH GRADE DYSPLASIA OR MALIGNANCY IDENTIFIED.   CBC    Component Value Date/Time   WBC 5.9 10/04/2016 0700   RBC 3.37 (L) 10/04/2016 0700   HGB 10.4 (L) 10/04/2016 0700   HCT 30.1 (L) 10/04/2016 0700   PLT 169 10/04/2016 0700   MCV 89.3 10/04/2016 0700   MCH 30.9 10/04/2016 0700   MCHC 34.6 10/04/2016 0700   RDW 18.3 (H) 10/04/2016 0700   LYMPHSABS 1.6 10/02/2016 0714   MONOABS 0.4 10/02/2016 0714   EOSABS 0.1 10/02/2016 0714   BASOSABS 0.1 10/02/2016 0714   Hepatic Function Latest Ref Rng & Units 10/02/2016 09/27/2016 09/20/2016  Total Protein 6.5 - 8.1 g/dL 5.7(L) 6.4(L) 5.8(L)  Albumin 3.5 - 5.0 g/dL 2.6(L) 2.9(L) 2.5(L)  AST 15 - 41 U/L _0 ALT 14 - 54 U/L 13(L) 16 21  Alk Phosphatase 38 - 126 U/L 72 80 90  Total Bilirubin 0.3 - 1.2 mg/dL 1.2 1.3(H) 1.5(H)  Bilirubin, Direct 0.1 - 0.5 mg/dL 0.4 - 0.5    Review of Systems Past Medical History:  Diagnosis Date    . Allergy   . Anemia    iron def  . Cataract   . Chronic combined systolic (congestive) and diastolic (congestive) heart failure ([place])   . Diabetes mellitus without complication ([place])   . Dysphagia   . Hyperlipidemia   . Hypertension   . Muscle weakness   . Renal disorder    chroninc kidney disease  . Thyroid disease     Past Surgical History:  Procedure Laterality Date  . ABDOMINAL HYSTERECTOMY     partial but later ovaries removed  . EYE SURGERY Bilateral    cataract  . OOPHORECTOMY Bilateral     No Known Allergies  Current Outpatient Prescriptions on File Prior to Visit  Medication Sig Dispense Refill  . apixaban (ELIQUIS) 2.5 MG TABS tablet Take 2.5 mg by mouth 2 (two) times daily.    . bisacodyl (DULCOLAX) 5 MG EC tablet Take 2 tablets (10 mg total) by mouth daily as needed for mild constipation or moderate constipation. 30 tablet 0  . calcium carbonate (OS-CAL) 600 MG TABS tablet Take 600 mg by mouth every other day.     . carvedilol (COREG) 12.5 MG tablet Take 12.5 mg by mouth 2 (two) times daily with a meal. 1 bid 60 tablet 3  . docusate sodium (COLACE) 100  MG capsule Take 2 capsules (200 mg total) by mouth 2 (two) times daily. 10 capsule 0  . isosorbide mononitrate (IMDUR) 30 MG 24 hr tablet Take 1 tablet (30 mg total) by mouth daily. 30 tablet 0  . levothyroxine (SYNTHROID, LEVOTHROID) 88 MCG tablet Take 1 tablet (88 mcg total) by mouth daily before breakfast. 90 tablet 2  . Omega-3 Fatty Acids (FISH OIL) 1000 MG CAPS Take 1 capsule by mouth every other day.     . polyethylene glycol (MIRALAX / GLYCOLAX) packet Take 17 g by mouth daily.     No current facility-administered medications on file prior to visit.         Objective:   Physical Exam  Vitals:   10/18/16 1058  Weight: 117 lb 4.8 oz (53.2 kg)  Height: _0  (1.575 m)  Alert and oriented. Skin warm and dry. Oral mucosa is moist.   . Sclera anicteric, conjunctivae is pink. Thyroid not enlarged. No  cervical lymphadenopathy. Lungs clear. Heart regular rate and rhythm.  Abdomen is soft. Bowel sounds are positive. No hepatomegaly. No abdominal masses felt. No tenderness.  No edema to lower extremities.  Stool brown and guaiac negative          Assessment & Plan:  Normocytic anemia.  CBC today. Will discuss with Dr Laural Golden. Last colonoscopy in 2010. Tubular adenoma. She was guaiac negative in office.

## 2016-10-18 NOTE — Patient Instructions (Signed)
CBC today.  

## 2016-10-24 ENCOUNTER — Encounter (HOSPITAL_COMMUNITY): Payer: Medicare Other

## 2016-10-24 ENCOUNTER — Encounter (HOSPITAL_COMMUNITY): Payer: Medicare Other | Attending: Oncology | Admitting: Oncology

## 2016-10-24 ENCOUNTER — Other Ambulatory Visit (HOSPITAL_COMMUNITY)
Admission: RE | Admit: 2016-10-24 | Discharge: 2016-10-24 | Disposition: A | Discharge status: Home / Self Care | Payer: Medicare Other | Source: Skilled Nursing Facility | Attending: Oncology | Admitting: Oncology

## 2016-10-24 ENCOUNTER — Encounter (HOSPITAL_COMMUNITY): Payer: Self-pay | Admitting: Oncology

## 2016-10-24 VITALS — BP 136/60 | HR 69 | Resp 16 | Ht 62.0 in | Wt 120.0 lb

## 2016-10-24 DIAGNOSIS — N189 Chronic kidney disease, unspecified: Secondary | ICD-10-CM | POA: Diagnosis not present

## 2016-10-24 DIAGNOSIS — Z7901 Long term (current) use of anticoagulants: Secondary | ICD-10-CM | POA: Diagnosis not present

## 2016-10-24 DIAGNOSIS — D649 Anemia, unspecified: Secondary | ICD-10-CM

## 2016-10-24 DIAGNOSIS — Z87891 Personal history of nicotine dependence: Secondary | ICD-10-CM | POA: Diagnosis not present

## 2016-10-24 LAB — COMPREHENSIVE METABOLIC PANEL
ALT: 13 U/L — ABNORMAL LOW (ref 14–54)
AST: 21 U/L (ref 15–41)
Albumin: 3.1 g/dL — ABNORMAL LOW (ref 3.5–5.0)
Alkaline Phosphatase: 74 U/L (ref 38–126)
Anion gap: 8 (ref 5–15)
BUN: 52 mg/dL — AB (ref 6–20)
CALCIUM: 8.4 mg/dL — AB (ref 8.9–10.3)
CO2: 24 mmol/L (ref 22–32)
Chloride: 101 mmol/L (ref 101–111)
Creatinine, Ser: 2.02 mg/dL — ABNORMAL HIGH (ref 0.44–1.00)
GFR, EST AFRICAN AMERICAN: 26 mL/min — AB (ref >60)
GFR, EST NON AFRICAN AMERICAN: 22 mL/min — AB (ref >60)
GLUCOSE: 206 mg/dL — AB (ref 65–99)
POTASSIUM: 4.5 mmol/L (ref 3.5–5.1)
SODIUM: 133 mmol/L — AB (ref 135–145)
TOTAL PROTEIN: 6.7 g/dL (ref 6.5–8.1)
Total Bilirubin: 1.1 mg/dL (ref 0.3–1.2)

## 2016-10-24 LAB — RETICULOCYTES
RBC.: 2.68 MIL/uL — AB (ref 3.87–5.11)
RETIC CT PCT: 5.5 % — AB (ref 0.4–3.1)
Retic Count, Absolute: 147.4 10*3/uL (ref 19.0–186.0)

## 2016-10-24 LAB — CBC WITH DIFFERENTIAL/PLATELET
BASOS ABS: 0.1 10*3/uL (ref 0.0–0.1)
Basophils Relative: 1 %
EOS ABS: 0.3 10*3/uL (ref 0.0–0.7)
EOS PCT: 5 %
HCT: 24.4 % — ABNORMAL LOW (ref 36.0–46.0)
Hemoglobin: 8.6 g/dL — ABNORMAL LOW (ref 12.0–15.0)
LYMPHS ABS: 2.3 10*3/uL (ref 0.7–4.0)
LYMPHS PCT: 34 %
MCH: 32.1 pg (ref 26.0–34.0)
MCHC: 35.2 g/dL (ref 30.0–36.0)
MCV: 91 fL (ref 78.0–100.0)
MONO ABS: 0.6 10*3/uL (ref 0.1–1.0)
Monocytes Relative: 10 %
Neutro Abs: 3.4 10*3/uL (ref 1.7–7.7)
Neutrophils Relative %: 50 %
PLATELETS: 206 10*3/uL (ref 150–400)
RBC: 2.68 MIL/uL — AB (ref 3.87–5.11)
RDW: 15.7 % — AB (ref 11.5–15.5)
WBC: 6.7 10*3/uL (ref 4.0–10.5)

## 2016-10-24 LAB — VITAMIN B12: Vitamin B-12: 278 pg/mL (ref 180–914)

## 2016-10-24 LAB — IRON AND TIBC
Iron: 64 ug/dL (ref 28–170)
SATURATION RATIOS: 26 % (ref 10.4–31.8)
TIBC: 242 ug/dL — AB (ref 250–450)
UIBC: 178 ug/dL

## 2016-10-24 LAB — FERRITIN: FERRITIN: 548 ng/mL — AB (ref 11–307)

## 2016-10-24 LAB — LACTATE DEHYDROGENASE: LDH: 203 U/L — ABNORMAL HIGH (ref 98–192)

## 2016-10-24 LAB — FOLATE: Folate: 10.1 ng/mL (ref >5.9)

## 2016-10-24 NOTE — Progress Notes (Signed)
[facility] Initial Visit:  Patient Care Team: Chevis Pretty, FNP as PCP - General (Nurse Practitioner) Melina Schools, OD as Consulting Physician (Optometry)  CHIEF COMPLAINTS/PURPOSE OF CONSULTATION:  Anemia  HISTORY OF PRESENTING ILLNESS: Michele Walters 81 y.o. female presents today for evaluation of anemia. She has chronic anemia in the setting of CKD with a baseline hemoglobin in the 8-9 g/dL range. Patient's hemoglobin has dropped down on 09/05/2016 to 6 g/dL required 2 units PRBC transfusion. Follow-up hemoglobin was 10.4 g/dL on 09/06/16. B-12 level was 735 on 09/04/2016. Iron studies demonstrated a ferritin of 310, serum iron 88, TIBC 277, folate 12.6 on 09/04/2016. Patient subsequently had a decrease in her hemoglobin down to 7.4 g/dL on 10/02/2016. She was again transfused 2 units PRBC. Follow-up hemoglobin on 10/04/2016 was 10.4 g/dL. Most recent hemoglobin from 10/18/2016 was 8.9 g/dL, hematocrit 25.9%, MCV 91.2, WBC 6.4, platelets 195K. patient is chronically on Eliquis. However her fecal occult blood tests have been negative. Her PCP is concerned about possible hemolytic anemia since on 8/16 patient's total bilirubin was 1.5, indirect 1, direct bilirubin 0.5. Repeat on 10/02/2016 demonstrated total bilirubin was 1.2, indirect bilirubin 0.8, direct bilirubin 0.4.  Patient states that she has been doing well. She denies any fatigue. She denies any evidence of bleeding including melena, hematochezia, hematuria, hemoptysis. She denies any chest pain, shortness breath, abdominal pain. She states she is almost finished with rehabilitation and will be leaving soon. She denies noting any scleral icterus or jaundice.  Review of Systems - Oncology ROS as per HPI otherwise 12 point ROS is negative.  MEDICAL HISTORY: Past Medical History:  Diagnosis Date  . Allergy   . Anemia    iron def  . Cataract   . Chronic combined systolic (congestive) and diastolic  (congestive) heart failure ([place])   . Diabetes mellitus without complication ([place])   . Dysphagia   . Hyperlipidemia   . Hypertension   . Muscle weakness   . Renal disorder    chroninc kidney disease  . Thyroid disease     SURGICAL HISTORY: Past Surgical History:  Procedure Laterality Date  . ABDOMINAL HYSTERECTOMY     partial but later ovaries removed  . EYE SURGERY Bilateral    cataract  . OOPHORECTOMY Bilateral     SOCIAL HISTORY: Social History   Social History  . Marital status: Single    Spouse name: N/A  . Number of children: N/A  . Years of education: N/A   Occupational History  . Retired    Social History Main Topics  . Smoking status: Former Smoker    Packs/day: 1.00    Years: 55.00    Types: Cigarettes    Quit date: 08/05/2016  . Smokeless tobacco: Never Used  . Alcohol use No  . Drug use: No  . Sexual activity: No   Other Topics Concern  . Not on file   Social History Narrative  . No narrative on file    FAMILY HISTORY Family History  Problem Relation Age of Onset  . Heart disease Mother   . Heart attack Mother        massive  . COPD Father   . Emphysema Father   . Down syndrome Brother     ALLERGIES:  has No Known Allergies.  MEDICATIONS:  No current outpatient prescriptions on file.   No current facility-administered medications for this visit.     PHYSICAL EXAMINATION:   Vitals:   10/24/16 1308  BP: 136/60  Pulse: 69  Resp: 16  SpO2: 99%    Filed Weights   10/24/16 1308  Weight: 120 lb (54.4 kg)     Physical Exam Constitutional: Well-developed, well-nourished, and in no distress.   HENT:  Head: Normocephalic and atraumatic.  Mouth/Throat: No oropharyngeal exudate. Mucosa moist. Eyes: Pupils are equal, round, and reactive to light. Conjunctivae are normal. No scleral icterus.  Neck: Normal range of motion. Neck supple. No JVD present.  Cardiovascular: Normal rate, regular rhythm and normal heart sounds.  Exam  reveals no gallop and no friction rub.   No murmur heard. Pulmonary/Chest: Effort normal and breath sounds normal. No respiratory distress. No wheezes.No rales.  Abdominal: Soft. Bowel sounds are normal. No distension. There is no tenderness. There is no guarding.  Musculoskeletal: No edema or tenderness.Right lower leg in a brace.  Lymphadenopathy:    No cervical or supraclavicular adenopathy.  Neurological: Alert and oriented to person, place, and time. No cranial nerve deficit.  Skin: Skin is warm and dry. No rash noted. No erythema. No pallor.  Psychiatric: Affect and judgment normal.   LABORATORY DATA: I have personally reviewed the data as listed:  Admission on 09/19/2016  Component Date Value Ref Range Status  . ABO/RH(D) 10/02/2016 AB POS   Final  . Antibody Screen 10/02/2016 POS   Final  . Sample Expiration 10/02/2016 10/05/2016   Final  . DAT, IgG 10/02/2016 POS   Final  . Antibody Identification 17/40/8144 NON SPECIFIC ANTIBODY REACTIVITY   Final  . Antibody ID,T [place] 10/02/2016 WARM AUTOANTIBODY   Final  . Unit Number 10/02/2016 [id number]   Final  . Blood Component Type 10/02/2016 RED CELLS,LR   Final  . Unit division 10/02/2016 00   Final  . Status of Unit 10/02/2016 ISSUED,FINAL   Final  . Donor AG Type 10/02/2016 NEGATIVE FOR E ANTIGEN NEGATIVE FOR KELL ANTIGEN NEGATIVE FOR KIDD B ANTIGEN NEGATIVE FOR S ANTIGEN   Final  . Transfusion Status 10/02/2016 OK TO TRANSFUSE   Final  . Crossmatch Result 10/02/2016 COMPATIBLE   Final  . Unit Number 10/02/2016 [id number]   Final  . Blood Component Type 10/02/2016 RED CELLS,LR   Final  . Unit division 10/02/2016 00   Final  . Status of Unit 10/02/2016 ISSUED,FINAL   Final  . Donor AG Type 10/02/2016 NEGATIVE FOR E ANTIGEN NEGATIVE FOR KELL ANTIGEN NEGATIVE FOR KIDD B ANTIGEN NEGATIVE FOR S ANTIGEN   Final  . Transfusion Status 10/02/2016 OK TO TRANSFUSE   Final  . Crossmatch Result 10/02/2016 COMPATIBLE   Final  .  Hemoglobin 10/02/2016 8.1* 12.0 - 15.0 g/dL Final  . HCT 10/02/2016 23.1* 36.0 - 46.0 % Final  . ISSUE DATE / TIME 10/02/2016 [id number]   Final  . Blood Product Unit Number 10/02/2016 [id number]   Final  . PRODUCT CODE 10/02/2016 [id number]   Final  . Unit Type and Rh 10/02/2016 5100   Final  . Blood Product Expiration Date 10/02/2016 [id number]   Final  . ISSUE DATE / TIME 10/02/2016 [id number]   Final  . Blood Product Unit Number 10/02/2016 [id number]   Final  . PRODUCT CODE 10/02/2016 [id number]   Final  . Unit Type and Rh 10/02/2016 6200   Final  . Blood Product Expiration Date 10/02/2016 [id number]   Final  Office Visit on 10/18/2016  Component Date Value Ref Range Status  . WBC 10/18/2016 6.4  3.8 - 10.8 Thousand/uL Final  . RBC 10/18/2016 2.84* 3.80 - 5.10  Million/uL Final  . Hemoglobin 10/18/2016 8.9* 11.7 - 15.5 g/dL Final  . HCT 10/18/2016 25.9* 35.0 - 45.0 % Final  . MCV 10/18/2016 91.2  80.0 - 100.0 fL Final  . MCH 10/18/2016 31.3  27.0 - 33.0 pg Final  . MCHC 10/18/2016 34.4  32.0 - 36.0 g/dL Final  . RDW 10/18/2016 15.5* 11.0 - 15.0 % Final  . Platelets 10/18/2016 195  140 - 400 Thousand/uL Final  . MPV 10/18/2016 8.8  7.5 - 12.5 fL Final  . Neutro Abs 10/18/2016 3104  1,500 - 7,800 cells/uL Final  . Lymphs Abs 10/18/2016 2464  850 - 3,900 cells/uL Final  . WBC mixed population 10/18/2016 550  200 - 950 cells/uL Final  . Eosinophils Absolute 10/18/2016 198  15 - 500 cells/uL Final  . Basophils Absolute 10/18/2016 83  0 - 200 cells/uL Final  . Neutrophils Relative % 10/18/2016 48.5  % Final  . Total Lymphocyte 10/18/2016 38.5  % Final  . Monocytes Relative 10/18/2016 8.6  % Final  . Eosinophils Relative 10/18/2016 3.1  % Final  . Basophils Relative 10/18/2016 1.3  % Final  Hospital Outpatient Visit on 10/08/2016  Component Date Value Ref Range Status  . Fecal Occult Bld 10/08/2016 NEGATIVE  NEGATIVE Final  Hospital Outpatient Visit on 10/04/2016   Component Date Value Ref Range Status  . WBC 10/04/2016 5.9  4.0 - 10.5 K/uL Final  . RBC 10/04/2016 3.37* 3.87 - 5.11 MIL/uL Final  . Hemoglobin 10/04/2016 10.4* 12.0 - 15.0 g/dL Final   Comment: DELTA CHECK NOTED POST TRANSFUSION SPECIMEN   . HCT 10/04/2016 30.1* 36.0 - 46.0 % Final  . MCV 10/04/2016 89.3  78.0 - 100.0 fL Final  . MCH 10/04/2016 30.9  26.0 - 34.0 pg Final  . MCHC 10/04/2016 34.6  30.0 - 36.0 g/dL Final  . RDW 10/04/2016 18.3* 11.5 - 15.5 % Final  . Platelets 10/04/2016 169  150 - 400 K/uL Final  Hospital Outpatient Visit on 10/02/2016  Component Date Value Ref Range Status  . WBC 10/02/2016 4.6  4.0 - 10.5 K/uL Final  . RBC 10/02/2016 2.32* 3.87 - 5.11 MIL/uL Final  . Hemoglobin 10/02/2016 7.4* 12.0 - 15.0 g/dL Final  . HCT 10/02/2016 21.8* 36.0 - 46.0 % Final  . MCV 10/02/2016 94.0  78.0 - 100.0 fL Final  . MCH 10/02/2016 31.9  26.0 - 34.0 pg Final  . MCHC 10/02/2016 33.9  30.0 - 36.0 g/dL Final  . RDW 10/02/2016 18.4* 11.5 - 15.5 % Final  . Platelets 10/02/2016 180  150 - 400 K/uL Final  . Neutrophils Relative % 10/02/2016 51  % Final  . Neutro Abs 10/02/2016 2.4  1.7 - 7.7 K/uL Final  . Lymphocytes Relative 10/02/2016 36  % Final  . Lymphs Abs 10/02/2016 1.6  0.7 - 4.0 K/uL Final  . Monocytes Relative 10/02/2016 9  % Final  . Monocytes Absolute 10/02/2016 0.4  0.1 - 1.0 K/uL Final  . Eosinophils Relative 10/02/2016 2  % Final  . Eosinophils Absolute 10/02/2016 0.1  0.0 - 0.7 K/uL Final  . Basophils Relative 10/02/2016 2  % Final  . Basophils Absolute 10/02/2016 0.1  0.0 - 0.1 K/uL Final  . Sodium 10/02/2016 137  135 - 145 mmol/L Final  . Potassium 10/02/2016 4.0  3.5 - 5.1 mmol/L Final  . Chloride 10/02/2016 105  101 - 111 mmol/L Final  . CO2 10/02/2016 25  22 - 32 mmol/L Final  . Glucose, Bld 10/02/2016 102*  65 - 99 mg/dL Final  . BUN 10/02/2016 31* 6 - 20 mg/dL Final  . Creatinine, Ser 10/02/2016 1.91* 0.44 - 1.00 mg/dL Final  . Calcium 10/02/2016  8.1* 8.9 - 10.3 mg/dL Final  . GFR calc non Af Amer 10/02/2016 24* >60 mL/min Final  . GFR calc Af Amer 10/02/2016 28* >60 mL/min Final   Comment: (NOTE) The eGFR has been calculated using the CKD EPI equation. This calculation has not been validated in all clinical situations. eGFR's persistently <60 mL/min signify possible Chronic Kidney Disease.   . Anion gap 10/02/2016 7  5 - 15 Final  . Total Protein 10/02/2016 5.7* 6.5 - 8.1 g/dL Final  . Albumin 10/02/2016 2.6* 3.5 - 5.0 g/dL Final  . AST 10/02/2016 21  15 - 41 U/L Final  . ALT 10/02/2016 13* 14 - 54 U/L Final  . Alkaline Phosphatase 10/02/2016 72  38 - 126 U/L Final  . Total Bilirubin 10/02/2016 1.2  0.3 - 1.2 mg/dL Final  . Bilirubin, Direct 10/02/2016 0.4  0.1 - 0.5 mg/dL Final  . Indirect Bilirubin 10/02/2016 0.8  0.3 - 0.9 mg/dL Final  . Hgb A1c MFr Bld 10/02/2016 5.0  4.8 - 5.6 % Final   Comment: (NOTE) Pre diabetes:          5.7%-6.4% Diabetes:              >6.4% Glycemic control for   <7.0% adults with diabetes   . Mean Plasma Glucose 10/02/2016 96.8  mg/dL Final   Performed at [facility] [address] [place], [place] [address]  . LDH 10/02/2016 208* 98 - 192 U/L Final  Hospital Outpatient Visit on 10/01/2016  Component Date Value Ref Range Status  . WBC 10/01/2016 5.2  4.0 - 10.5 K/uL Final  . RBC 10/01/2016 2.39* 3.87 - 5.11 MIL/uL Final  . Hemoglobin 10/01/2016 7.6* 12.0 - 15.0 g/dL Final  . HCT 10/01/2016 22.4* 36.0 - 46.0 % Final  . MCV 10/01/2016 93.7  78.0 - 100.0 fL Final  . MCH 10/01/2016 31.8  26.0 - 34.0 pg Final  . MCHC 10/01/2016 33.9  30.0 - 36.0 g/dL Final  . RDW 10/01/2016 18.3* 11.5 - 15.5 % Final  . Platelets 10/01/2016 191  150 - 400 K/uL Final  . Neutrophils Relative % 10/01/2016 50  % Final  . Neutro Abs 10/01/2016 2.7  1.7 - 7.7 K/uL Final  . Lymphocytes Relative 10/01/2016 34  % Final  . Lymphs Abs 10/01/2016 1.8  0.7 - 4.0 K/uL Final  . Monocytes Relative 10/01/2016 12  %  Final  . Monocytes Absolute 10/01/2016 0.6  0.1 - 1.0 K/uL Final  . Eosinophils Relative 10/01/2016 3  % Final  . Eosinophils Absolute 10/01/2016 0.1  0.0 - 0.7 K/uL Final  . Basophils Relative 10/01/2016 1  % Final  . Basophils Absolute 10/01/2016 0.1  0.0 - 0.1 K/uL Final  . Sodium 10/01/2016 136  135 - 145 mmol/L Final  . Potassium 10/01/2016 4.1  3.5 - 5.1 mmol/L Final  . Chloride 10/01/2016 102  101 - 111 mmol/L Final  . CO2 10/01/2016 26  22 - 32 mmol/L Final  . Glucose, Bld 10/01/2016 89  65 - 99 mg/dL Final  . BUN 10/01/2016 34* 6 - 20 mg/dL Final  . Creatinine, Ser 10/01/2016 1.81* 0.44 - 1.00 mg/dL Final  . Calcium 10/01/2016 8.3* 8.9 - 10.3 mg/dL Final  . GFR calc non Af Amer 10/01/2016 25* >60 mL/min Final  .  GFR calc Af Amer 10/01/2016 30* >60 mL/min Final   Comment: (NOTE) The eGFR has been calculated using the CKD EPI equation. This calculation has not been validated in all clinical situations. eGFR's persistently <60 mL/min signify possible Chronic Kidney Disease.   Georgiann Hahn gap 10/01/2016 8  5 - 15 Final  Hospital Outpatient Visit on 09/29/2016  Component Date Value Ref Range Status  . Fecal Occult Bld 09/29/2016 NEGATIVE  NEGATIVE Final  Hospital Outpatient Visit on 09/27/2016  Component Date Value Ref Range Status  . WBC 09/27/2016 5.3  4.0 - 10.5 K/uL Final  . RBC 09/27/2016 2.69* 3.87 - 5.11 MIL/uL Final  . Hemoglobin 09/27/2016 8.7* 12.0 - 15.0 g/dL Final  . HCT 09/27/2016 25.0* 36.0 - 46.0 % Final  . MCV 09/27/2016 92.9  78.0 - 100.0 fL Final  . MCH 09/27/2016 32.3  26.0 - 34.0 pg Final  . MCHC 09/27/2016 34.8  30.0 - 36.0 g/dL Final  . RDW 09/27/2016 17.9* 11.5 - 15.5 % Final  . Platelets 09/27/2016 211  150 - 400 K/uL Final  . Neutrophils Relative % 09/27/2016 57  % Final  . Neutro Abs 09/27/2016 3.1  1.7 - 7.7 K/uL Final  . Lymphocytes Relative 09/27/2016 30  % Final  . Lymphs Abs 09/27/2016 1.6  0.7 - 4.0 K/uL Final  . Monocytes Relative 09/27/2016 9   % Final  . Monocytes Absolute 09/27/2016 0.5  0.1 - 1.0 K/uL Final  . Eosinophils Relative 09/27/2016 2  % Final  . Eosinophils Absolute 09/27/2016 0.1  0.0 - 0.7 K/uL Final  . Basophils Relative 09/27/2016 2  % Final  . Basophils Absolute 09/27/2016 0.1  0.0 - 0.1 K/uL Final  . Sodium 09/27/2016 131* 135 - 145 mmol/L Final  . Potassium 09/27/2016 3.6  3.5 - 5.1 mmol/L Final  . Chloride 09/27/2016 97* 101 - 111 mmol/L Final  . CO2 09/27/2016 25  22 - 32 mmol/L Final  . Glucose, Bld 09/27/2016 214* 65 - 99 mg/dL Final  . BUN 09/27/2016 33* 6 - 20 mg/dL Final  . Creatinine, Ser 09/27/2016 1.75* 0.44 - 1.00 mg/dL Final  . Calcium 09/27/2016 8.3* 8.9 - 10.3 mg/dL Final  . Total Protein 09/27/2016 6.4* 6.5 - 8.1 g/dL Final  . Albumin 09/27/2016 2.9* 3.5 - 5.0 g/dL Final  . AST 09/27/2016 29  15 - 41 U/L Final  . ALT 09/27/2016 16  14 - 54 U/L Final  . Alkaline Phosphatase 09/27/2016 80  38 - 126 U/L Final  . Total Bilirubin 09/27/2016 1.3* 0.3 - 1.2 mg/dL Final  . GFR calc non Af Amer 09/27/2016 27* >60 mL/min Final  . GFR calc Af Amer 09/27/2016 31* >60 mL/min Final   Comment: (NOTE) The eGFR has been calculated using the CKD EPI equation. This calculation has not been validated in all clinical situations. eGFR's persistently <60 mL/min signify possible Chronic Kidney Disease.   Georgiann Hahn gap 09/27/2016 9  5 - 15 Final  Hospital Outpatient Visit on 09/24/2016  Component Date Value Ref Range Status  . WBC 09/24/2016 4.9  4.0 - 10.5 K/uL Final  . RBC 09/24/2016 2.75* 3.87 - 5.11 MIL/uL Final  . Hemoglobin 09/24/2016 8.6* 12.0 - 15.0 g/dL Final  . HCT 09/24/2016 25.1* 36.0 - 46.0 % Final  . MCV 09/24/2016 91.3  78.0 - 100.0 fL Final  . MCH 09/24/2016 31.3  26.0 - 34.0 pg Final  . MCHC 09/24/2016 34.3  30.0 - 36.0 g/dL Final  . RDW  09/24/2016 18.2* 11.5 - 15.5 % Final  . Platelets 09/24/2016 206  150 - 400 K/uL Final  . Sodium 09/24/2016 135  135 - 145 mmol/L Final  . Potassium  09/24/2016 3.5  3.5 - 5.1 mmol/L Final  . Chloride 09/24/2016 103  101 - 111 mmol/L Final  . CO2 09/24/2016 25  22 - 32 mmol/L Final  . Glucose, Bld 09/24/2016 109* 65 - 99 mg/dL Final  . BUN 09/24/2016 30* 6 - 20 mg/dL Final  . Creatinine, Ser 09/24/2016 1.68* 0.44 - 1.00 mg/dL Final  . Calcium 09/24/2016 8.0* 8.9 - 10.3 mg/dL Final  . GFR calc non Af Amer 09/24/2016 28* >60 mL/min Final  . GFR calc Af Amer 09/24/2016 32* >60 mL/min Final   Comment: (NOTE) The eGFR has been calculated using the CKD EPI equation. This calculation has not been validated in all clinical situations. eGFR's persistently <60 mL/min signify possible Chronic Kidney Disease.   . Anion gap 09/24/2016 7  5 - 15 Final    RADIOGRAPHIC STUDIES: I have personally reviewed the radiological images as listed and agree with the findings in the report  No results found.  ASSESSMENT/PLAN Normocytic anemia CKD On chronic anticoagulation with eliquis. Recent fecal occult negative.   PLAN: I will perform a full anemia workup with labs as stated below.  RTC in 2 weeks for follow up to review labs and discuss next plan of care.   Orders Placed This Encounter  Procedures  . CBC with Differential    Standing Status:   Future    Standing Expiration Date:   10/24/2017  . Comprehensive metabolic panel    Standing Status:   Future    Standing Expiration Date:   10/24/2017  . Lactate dehydrogenase    Standing Status:   Future    Standing Expiration Date:   10/24/2017  . Haptoglobin    Standing Status:   Future    Standing Expiration Date:   10/24/2017  . Folate    Standing Status:   Future    Standing Expiration Date:   10/24/2017  . Vitamin B12    Standing Status:   Future    Standing Expiration Date:   10/24/2017  . Ferritin    Standing Status:   Future    Standing Expiration Date:   10/24/2017  . Iron and TIBC    Standing Status:   Future    Standing Expiration Date:   10/24/2017  . Erythropoietin    Standing  Status:   Future    Standing Expiration Date:   10/24/2017  . Reticulocytes    Standing Status:   Future    Standing Expiration Date:   10/24/2017  . Hemoglobinopathy evaluation    Standing Status:   Future    Standing Expiration Date:   10/24/2017  . Multiple Myeloma Panel (SPEP&IFE w/QIG)    Standing Status:   Future    Standing Expiration Date:   10/24/2017  . Direct antiglobulin test    Standing Status:   Future    Standing Expiration Date:   10/24/2017    All questions were answered. The patient knows to call the clinic with any problems, questions or concerns.  This note was electronically signed.    Twana First, MD  10/24/2016 1:14 PM

## 2016-10-25 ENCOUNTER — Encounter: Payer: Self-pay | Admitting: Internal Medicine

## 2016-10-25 ENCOUNTER — Non-Acute Institutional Stay (SKILLED_NURSING_FACILITY): Payer: Medicare Other | Admitting: Internal Medicine

## 2016-10-25 DIAGNOSIS — N183 Chronic kidney disease, stage 3 unspecified: Secondary | ICD-10-CM

## 2016-10-25 DIAGNOSIS — E1100 Type 2 diabetes mellitus with hyperosmolarity without nonketotic hyperglycemic-hyperosmolar coma (NKHHC): Secondary | ICD-10-CM

## 2016-10-25 DIAGNOSIS — I480 Paroxysmal atrial fibrillation: Secondary | ICD-10-CM | POA: Diagnosis not present

## 2016-10-25 DIAGNOSIS — I5042 Chronic combined systolic (congestive) and diastolic (congestive) heart failure: Secondary | ICD-10-CM

## 2016-10-25 DIAGNOSIS — E039 Hypothyroidism, unspecified: Secondary | ICD-10-CM | POA: Diagnosis not present

## 2016-10-25 DIAGNOSIS — D649 Anemia, unspecified: Secondary | ICD-10-CM

## 2016-10-25 LAB — HEMOGLOBINOPATHY EVALUATION
HGB A: 97.8 % (ref 96.4–98.8)
HGB C: 0 %
HGB S QUANTITAION: 0 %
Hgb A2 Quant: 2.2 % (ref 1.8–3.2)
Hgb F Quant: 0 % (ref 0.0–2.0)
Hgb Variant: 0 %

## 2016-10-25 LAB — HAPTOGLOBIN: Haptoglobin: 10 mg/dL — ABNORMAL LOW (ref 34–200)

## 2016-10-25 LAB — ERYTHROPOIETIN: ERYTHROPOIETIN: 40.9 m[IU]/mL — AB (ref 2.6–18.5)

## 2016-10-25 NOTE — Progress Notes (Signed)
Location:   [facility] Room Number: 132/P Place of Service:  SNF (31)  Provider: Edmon Crape  PCP: Bennie Pierini, FNP Patient Care Team: Bennie Pierini, FNP as PCP - General (Nurse Practitioner) Derryl Harbor, OD as Consulting Physician [facility])  Extended Emergency Contact Information Primary Emergency Contact: Gibson,Reba Address: [address] [place], [place] [address] [place] of [place] Home Phone: [phone] Work Phone: [phone] Relation: Sister  Code Status: Full Code Goals of care:  Advanced Directive information Advanced Directives 10/25/2016  Does Patient Have a Medical Advance Directive? Yes  Type of Advance Directive (No Data)  Does patient want to make changes to medical advance directive? No - Patient declined  Copy of Healthcare Power of Attorney in Chart? -  Would patient like information on creating a medical advance directive? No - Patient declined     No Known Allergies  Chief Complaint  Patient presents with  . Discharge Note    Discharge Visit    HPI:  81 y.o. female seen today for discharge from facility later this week.  She was initially admitted here in early August after hospitalization for symptomatic anemia and acute kidney injury.  She previously been hospitalized with combined CHF and acute renal failure.  At that time she was diuresed with Lasix which she continues on and this is been stable when her stay here.  She also had new-onset atrial fibrillation and has been seen by cardiology and this appears rate controlled on Coreg she is on Eliquis for anticoagulation.  For anticoagulation is complicated with her history of anemia-she has been followed by GI closely  He recently required a blood transfusion secondary to a drop in her hemoglobin was 10.4 after the transfusion on lab done yesterday was 8.6 this will need follow-up-had been as low as 7.4 before  transfusion.  .  She has been followed by oncology who has ordered further labs.  Clinically she appears to be asymptomatic continues wheel about facility in her wheelchair-she also has an appointment follow-up with neurology with a history of foot drop.  She has a history of diabetes type 2 this years to be well controlled hemoglobin A1c was 5.0 on lab done August 2018 blood sugars run largely in the 80s to low 100s.  In regards to CHF or weight appears to be relatively stable at 120 pounds she has trace lower extremity edema does not complain of any shortness of breath energy level appears to be good-.  She also had elevated bilirubin on initial admission but this appears to have normalized and updated labs are being followed it appears by oncology.  She also was found on an ultrasound to have an incidental abdominal aortic aneurysm 5.5 cm at diameter-this has been evaluated by vascular surgery thoughtt to be a questionable candidate for aggressive intervention here since  is asymptomatic and has numerous comorbidities--family apparently is considering options whether to be aggressive here.  Regards chronic kidney disease creatinine is slightly above her baseline at 2.0 to almost recent lab this will warrant follow up as outpatient by primary care provider.  She will be going home she will have hired help in family close by to help as well-she will need PT and OT for further strengthening as well as nursing support to monitor her numerous medical issues.            Past Medical History:  Diagnosis Date  . Allergy   .  Anemia    iron def  . Cataract   . Chronic combined systolic (congestive) and diastolic (congestive) heart failure (HCC)   . Diabetes mellitus without complication (HCC)   . Dysphagia   . Hyperlipidemia   . Hypertension   . Muscle weakness   . Renal disorder    chroninc kidney disease  . Thyroid disease     Past Surgical History:  Procedure Laterality  Date  . ABDOMINAL HYSTERECTOMY     partial but later ovaries removed  . EYE SURGERY Bilateral    cataract  . OOPHORECTOMY Bilateral       reports that she quit smoking about 2 months ago. Her smoking use included Cigarettes. She has a 55.00 pack-year smoking history. She has never used smokeless tobacco. She reports that she does not drink alcohol or use drugs. Social History   Social History  . Marital status: Single    Spouse name: N/A  . Number of children: N/A  . Years of education: N/A   Occupational History  . Retired    Social History Main Topics  . Smoking status: Former Smoker    Packs/day: 1.00    Years: 55.00    Types: Cigarettes    Quit date: 08/05/2016  . Smokeless tobacco: Never Used  . Alcohol use No  . Drug use: No  . Sexual activity: No   Other Topics Concern  . Not on file   Social History Narrative  . No narrative on file   Functional Status Survey:    No Known Allergies  Pertinent  Health Maintenance Due  Topic Date Due  . INFLUENZA VACCINE  01/05/2017 (Originally 09/05/2016)  . FOOT EXAM  01/05/2017 (Originally 07/10/2016)  . OPHTHALMOLOGY EXAM  01/05/2017 (Originally 06/22/2016)  . URINE MICROALBUMIN  01/05/2017 (Originally 10/25/2016)  . DEXA SCAN  01/05/2017 (Originally 07/15/2016)  . HEMOGLOBIN A1C  04/04/2017  . PNA vac Low Risk Adult  Completed    Medications: Outpatient Encounter Prescriptions as of 10/25/2016  Medication Sig  . apixaban (ELIQUIS) 2.5 MG TABS tablet Take 2.5 mg by mouth 2 (two) times daily.  . bisacodyl (DULCOLAX) 5 MG EC tablet Take 2 tablets (10 mg total) by mouth daily as needed for mild constipation or moderate constipation.  . calcium carbonate (OS-CAL) 600 MG TABS tablet Take 600 mg by mouth every other day.   . carvedilol (COREG) 12.5 MG tablet Take 12.5 mg by mouth 2 (two) times daily with a meal. 1 bid  . docusate sodium (COLACE) 100 MG capsule Take 2 capsules (200 mg total) by mouth 2 (two) times daily.  .  isosorbide mononitrate (IMDUR) 30 MG 24 hr tablet Take 1 tablet (30 mg total) by mouth daily.  Marland Kitchen levothyroxine (SYNTHROID, LEVOTHROID) 88 MCG tablet Take 1 tablet (88 mcg total) by mouth daily before breakfast.  . Omega-3 Fatty Acids (FISH OIL) 1000 MG CAPS Take 1 capsule by mouth every other day.   . polyethylene glycol (MIRALAX / GLYCOLAX) packet Take 17 g by mouth daily.   No facility-administered encounter medications on file as of 10/25/2016.      Review of Systems   In general has no complaints of fever chills weight appears to be relatively stable.  Skin does not complain of rashes or itching initial questionable jaundiced appearance appears to have improved.  Head ears eyes nose mouth and throat does not complaining visual changes or difficulty swallowing.  Respiratory denies shortness breath or cough.  Cardiac does not complain of chest  pain has some mild lower extremity edema.  GI does not complaining of any abdominal discomfort nausea vomiting diarrhea constipation.  GU does not complain of dysuria.  Muscle skeletal continues with some weakness but does not complain of joint pain at this time.  Neurologic does not complain of dizziness headache syncope does have a history of foot drop.  Psych does not complain of overt anxiety or depression continues to be in good spirits  Vitals:   10/25/16 1611  BP: 127/78  Pulse: 79  Resp: 18  Temp: (!) 96.6 F (35.9 C)  TempSrc: Oral  SpO2: 98%  Weight is 120.1 pounds  Physical Exam  In general this is a pleasant elderly female in no distress.  Her skin is warm and dry.  Head ears eyes nose mouth and throat sclera and conjunctiva are clear pupils appear reactive light visual acuity appears grossly intact.  Oropharynx is clear mucous membranes moist.  Chest is clear to auscultation there is no labored breathing.  Heart is regular irregular rate and rhythm without murmur gallop or rub she has trace lower extremity  edema.  Abdomen is soft nontender with positive bowel sounds.  Musculoskeletal largely ambulates in a wheelchair moves all her extremities 4 with baseline strength and range of motion.  Neurologic is grossly intact her speech is clear no lateralizing findings.  Psych she is alert and oriented pleasant and appropriate   Labs reviewed: Basic Metabolic Panel:  Recent Labs  16/10/96 0621  10/01/16 0730 10/02/16 0714 10/24/16 1333  NA  --   < > 136 137 133*  K  --   < > 4.1 4.0 4.5  CL  --   < > 102 105 101  CO2  --   < > 26 25 24   GLUCOSE  --   < > 89 102* 206*  BUN  --   < > 34* 31* 52*  CREATININE  --   < > 1.81* 1.91* 2.02*  CALCIUM  --   < > 8.3* 8.1* 8.4*  MG 1.7  --   --   --   --   < > = values in this interval not displayed. Liver Function Tests:  Recent Labs  09/27/16 0911 10/02/16 0714 10/24/16 1333  AST 29 21 21   ALT 16 13* 13*  ALKPHOS 80 72 74  BILITOT 1.3* 1.2 1.1  PROT 6.4* 5.7* 6.7  ALBUMIN 2.9* 2.6* 3.1*   No results for input(s): LIPASE, AMYLASE in the last 8760 hours. No results for input(s): AMMONIA in the last 8760 hours. CBC:  Recent Labs  10/02/16 0714  10/04/16 0700 10/18/16 1131 10/24/16 1333  WBC 4.6  --  5.9 6.4 6.7  NEUTROABS 2.4  --   --  3,104 3.4  HGB 7.4*  < > 10.4* 8.9* 8.6*  HCT 21.8*  < > 30.1* 25.9* 24.4*  MCV 94.0  --  89.3 91.2 91.0  PLT 180  --  169 195 206  < > = values in this interval not displayed. Cardiac Enzymes:  Recent Labs  09/05/16 0005 09/05/16 0556 09/05/16 0813  TROPONINI 0.08* 0.07* 0.08*   BNP: Invalid input(s): POCBNP CBG:  Recent Labs  09/07/16 2111 09/08/16 0759 09/08/16 1150  GLUCAP 123* 133* 211*    Procedures and Imaging Studies During Stay: Mr Brain Wo Contrast  Result Date: 10/10/2016 CLINICAL DATA:  Right foot drop EXAM: MRI HEAD WITHOUT CONTRAST TECHNIQUE: Multiplanar, multiecho pulse sequences of the brain and surrounding structures were obtained  without intravenous  contrast. COMPARISON:  CT head 09/04/2016 FINDINGS: Brain: Moderate to advanced atrophy. Moderate chronic white matter changes. Negative for acute infarct. Negative for hemorrhage or mass. Vascular: Normal arterial flow void Skull and upper cervical spine: Negative Sinuses/Orbits: Negative Other: None IMPRESSION: No acute intracranial abnormality. Atrophy and chronic microvascular ischemic change in the white matter. Electronically Signed   By: Marlan Palau M.D.   On: 10/10/2016 15:53   Mr Cervical Spine Wo Contrast  Result Date: 10/11/2016 CLINICAL DATA:  Neck and upper back pain for 1 month. Multiple falls. EXAM: MRI CERVICAL SPINE WITHOUT CONTRAST TECHNIQUE: Multiplanar and multiecho pulse sequences of the cervical spine were obtained without intravenous contrast. COMPARISON:  None. FINDINGS: MRI CERVICAL SPINE FINDINGS Alignment: Physiologic. Vertebrae: No fracture, evidence of discitis, or bone lesion. Cord: Normal signal and morphology. Posterior Fossa, vertebral arteries, paraspinal tissues: Posterior fossa demonstrates no focal abnormality. Vertebral artery flow voids are maintained. Paraspinal soft tissues are unremarkable. Disc levels: Discs: Degenerative disc disease with disc height loss at C4-5 and C5-6. C2-3: No significant disc bulge. No neural foraminal stenosis. No central canal stenosis. C3-4: Mild broad-based disc bulge. No neural foraminal stenosis. No central canal stenosis. C4-5: Broad-based disc osteophyte complex with a large right paracentral component flattening the ventral right paracentral cervical spinal cord. Severe right foraminal stenosis. Mild left foraminal stenosis. Severe spinal stenosis. C5-6: Broad-based disc osteophyte complex eccentric towards the right. Bilateral uncovertebral degenerative changes. Severe right foraminal stenosis. No left foraminal stenosis. Mild spinal stenosis. C6-7: No significant disc bulge. No neural foraminal stenosis. No central canal stenosis.  C7-T1: No significant disc bulge. No neural foraminal stenosis. No central canal stenosis. IMPRESSION: CERVICAL SPINE: 1. No acute osseous injury the cervical spine. 2. At C4-5 there is a broad-based disc osteophyte complex with a large right paracentral component flattening the ventral right paracentral cervical spinal cord. Severe right foraminal stenosis. Mild left foraminal stenosis. Severe spinal stenosis. 3. At C5-6 there is a broad-based disc osteophyte complex eccentric towards the right. Bilateral uncovertebral degenerative changes. Severe right foraminal stenosis. No left foraminal stenosis. Mild spinal stenosis. Electronically Signed   By: Elige Ko   On: 10/11/2016 11:15   Mr Thoracic Spine Wo Contrast  Result Date: 10/11/2016 CLINICAL DATA:  Radiculopathy.  Back pain.  History of falls EXAM: MRI THORACIC SPINE WITHOUT CONTRAST TECHNIQUE: Multiplanar, multisequence MR imaging of the thoracic spine was performed. No intravenous contrast was administered. COMPARISON:  None. FINDINGS: Alignment:  Normal Vertebrae: Negative for fracture or mass. Cord:  Normal cord signal and morphology Paraspinal and other soft tissues: Negative Disc levels: Thoracic degenerative change is present. Abnormal disc spaces are described below T5-6:  Right paracentral disc protrusion with cord flattening. T7-8: Moderately large central disc protrusion with cord flattening. T12-L1: Central disc protrusion with extruded disc fragment extending cranially. No significant stenosis. L1-2: Extruded disc fragment on the right with cranial migration of disc material. Axial images not obtained through this level. IMPRESSION: Negative for fracture. Thoracic disc degeneration with multiple disc protrusions as above. No significant spinal stenosis or cord compression. Electronically Signed   By: Marlan Palau M.D.   On: 10/11/2016 11:14    Assessment/Plan:    #1-history of anemia status post transfusion on August 29 hemoglobin is  8.6 on most recent lab this will warrant follow-up she is followed closely by oncology which has ordered updated labs.  This will need follow-up by primary care provider as well.  She is also followed by GI.  #  2 history of chronic kidney disease creatinine 2.02 BUN of 52 is slightly above her baseline this will warm follow-up as well as an outpatient clinically she appears to be doing well.  #3-history of CHF with ejection fraction 3540% her weight has been stable edema appears to be stable she continues on Coreg as well as Imdur currently not on a diuretic but weight edema appears to be stable she appears to be stable in this regards.  #4-history of atrial fibrillation this appears rate controlled on Coreg she is on Eliquis for anticoagulation again close monitoring of her hemoglobin is necessary because of history anemia and being on anticoagulant.  #5-history type 2 diabetes this appears diet-controlled blood sugars ranging from the 80s to low 100s hemoglobin A1c was 5.0 on most recent lab.  #6-hypertension this appears stable on Coreg.  #7 history hypothyroidism she is on Synthroid TSH was within normal range at 1.986 on lab done June 2018.  Number 8-history of coronary artery disease she is ultimately asymptomatic here without complaints of chest pain she is on medical management is on Imdur as well as a beta blocker.  #9 history of abdominal aortic aneurysm as noted above it is 5.5 cm in diameter she's been evaluated by vascular surgery and they are awaiting family input apparently on aggressiveness of care thought to be a questionable candidate for aggressive intervention because of her comorbidities.  Number 10-foot drop she is followed by neurology. She will need continued PT and OT.  She is ambulating greater than 100 feet with a rolling walker at this time but still largely ambulates at times in a wheelchair.  Again she will be going home she does have close family support as  well as hired help she will need PT and OT for further strengthening with her numerous comorbidities as well as nursing support for multiple medical issues.  She also is followed by oncology-as well as GI and neurology-also will need expedient primary care v for follow-up of labs and clinical status  [id number]-of note greater than 30 minutes spent on this discharge summary-greater than 50% of time spent coordinating plan of care for numerous diagnoses

## 2016-10-26 ENCOUNTER — Other Ambulatory Visit (HOSPITAL_COMMUNITY): Payer: Self-pay

## 2016-10-26 ENCOUNTER — Other Ambulatory Visit (HOSPITAL_COMMUNITY): Payer: Self-pay | Admitting: Oncology

## 2016-10-26 ENCOUNTER — Telehealth (HOSPITAL_COMMUNITY): Payer: Self-pay

## 2016-10-26 DIAGNOSIS — D649 Anemia, unspecified: Secondary | ICD-10-CM

## 2016-10-26 LAB — MULTIPLE MYELOMA PANEL, SERUM
ALBUMIN SERPL ELPH-MCNC: 3 g/dL (ref 2.9–4.4)
ALBUMIN/GLOB SERPL: 0.9 (ref 0.7–1.7)
ALPHA 1: 0.2 g/dL (ref 0.0–0.4)
ALPHA2 GLOB SERPL ELPH-MCNC: 0.6 g/dL (ref 0.4–1.0)
B-Globulin SerPl Elph-Mcnc: 1.1 g/dL (ref 0.7–1.3)
GLOBULIN, TOTAL: 3.4 g/dL (ref 2.2–3.9)
Gamma Glob SerPl Elph-Mcnc: 1.5 g/dL (ref 0.4–1.8)
IGG (IMMUNOGLOBIN G), SERUM: 1412 mg/dL (ref 700–1600)
IgA: 601 mg/dL — ABNORMAL HIGH (ref 64–422)
IgM (Immunoglobulin M), Srm: 34 mg/dL (ref 26–217)
Total Protein ELP: 6.4 g/dL (ref 6.0–8.5)

## 2016-10-26 LAB — DIRECT ANTIGLOBULIN TEST (NOT AT ARMC): DAT, IgG: POSITIVE

## 2016-10-26 MED ORDER — PREDNISONE 20 MG PO TABS: 40.0000 mg | ORAL_TABLET | Freq: Every day | ORAL | 0 refills | Status: AC

## 2016-10-26 MED ORDER — PREDNISONE 20 MG PO TABS: 40.0000 mg | ORAL_TABLET | Freq: Every day | ORAL | 0 refills | Status: DC

## 2016-10-26 MED ORDER — PREDNISONE 20 MG PO TABS: 40.0000 mg | ORAL_TABLET | Freq: Every day | ORAL | Status: DC

## 2016-10-26 NOTE — Telephone Encounter (Signed)
Spoke with patients nurse, Renae, at the [facility]. Explained what Dr. Janyth Contes wants patient to do and why. Also faxed information to her attention.

## 2016-10-26 NOTE — Telephone Encounter (Signed)
-----   Message from Ralene Cork, MD sent at 10/26/2016  1:29 PM EDT ----- Marcelino Duster, I don't know if this patient is still in rehab or not, she said she was getting discharged soon. But can you call her and let her know that one of her tests for her anemia, the direct antiglobulin test, came back positive. What that means is that her immune system is destroying her RBC as theyre floating in her body. That's why she's been getting anemic and requiring blood transfusions. Treatment for that is to put her on steroids. I've sent in a prescription for prednisone  PO daily. She is to start taking that tomorrow morning with breakfast and continue taking it every day until she comes see me. We will get repeat labs on her next visit. TY

## 2016-10-30 ENCOUNTER — Ambulatory Visit: Payer: Self-pay | Admitting: *Deleted

## 2016-10-30 DIAGNOSIS — I130 Hypertensive heart and chronic kidney disease with heart failure and stage 1 through stage 4 chronic kidney disease, or unspecified chronic kidney disease: Secondary | ICD-10-CM | POA: Diagnosis not present

## 2016-10-30 DIAGNOSIS — E1122 Type 2 diabetes mellitus with diabetic chronic kidney disease: Secondary | ICD-10-CM | POA: Diagnosis not present

## 2016-10-30 DIAGNOSIS — Z87891 Personal history of nicotine dependence: Secondary | ICD-10-CM | POA: Diagnosis not present

## 2016-10-30 DIAGNOSIS — E039 Hypothyroidism, unspecified: Secondary | ICD-10-CM | POA: Diagnosis not present

## 2016-10-30 DIAGNOSIS — Z7952 Long term (current) use of systemic steroids: Secondary | ICD-10-CM | POA: Diagnosis not present

## 2016-10-30 DIAGNOSIS — N183 Chronic kidney disease, stage 3 (moderate): Secondary | ICD-10-CM | POA: Diagnosis not present

## 2016-10-30 DIAGNOSIS — E785 Hyperlipidemia, unspecified: Secondary | ICD-10-CM | POA: Diagnosis not present

## 2016-10-30 DIAGNOSIS — I5043 Acute on chronic combined systolic (congestive) and diastolic (congestive) heart failure: Secondary | ICD-10-CM | POA: Diagnosis not present

## 2016-10-30 DIAGNOSIS — Z7901 Long term (current) use of anticoagulants: Secondary | ICD-10-CM | POA: Diagnosis not present

## 2016-10-30 DIAGNOSIS — J45909 Unspecified asthma, uncomplicated: Secondary | ICD-10-CM | POA: Diagnosis not present

## 2016-10-30 DIAGNOSIS — D509 Iron deficiency anemia, unspecified: Secondary | ICD-10-CM | POA: Diagnosis not present

## 2016-10-31 DIAGNOSIS — E1122 Type 2 diabetes mellitus with diabetic chronic kidney disease: Secondary | ICD-10-CM | POA: Diagnosis not present

## 2016-10-31 DIAGNOSIS — I5043 Acute on chronic combined systolic (congestive) and diastolic (congestive) heart failure: Secondary | ICD-10-CM | POA: Diagnosis not present

## 2016-10-31 DIAGNOSIS — D509 Iron deficiency anemia, unspecified: Secondary | ICD-10-CM | POA: Diagnosis not present

## 2016-10-31 DIAGNOSIS — Z7901 Long term (current) use of anticoagulants: Secondary | ICD-10-CM | POA: Diagnosis not present

## 2016-10-31 DIAGNOSIS — E039 Hypothyroidism, unspecified: Secondary | ICD-10-CM | POA: Diagnosis not present

## 2016-10-31 DIAGNOSIS — E785 Hyperlipidemia, unspecified: Secondary | ICD-10-CM | POA: Diagnosis not present

## 2016-10-31 DIAGNOSIS — N183 Chronic kidney disease, stage 3 (moderate): Secondary | ICD-10-CM | POA: Diagnosis not present

## 2016-10-31 DIAGNOSIS — J45909 Unspecified asthma, uncomplicated: Secondary | ICD-10-CM | POA: Diagnosis not present

## 2016-10-31 DIAGNOSIS — Z87891 Personal history of nicotine dependence: Secondary | ICD-10-CM | POA: Diagnosis not present

## 2016-10-31 DIAGNOSIS — Z7952 Long term (current) use of systemic steroids: Secondary | ICD-10-CM | POA: Diagnosis not present

## 2016-10-31 DIAGNOSIS — I130 Hypertensive heart and chronic kidney disease with heart failure and stage 1 through stage 4 chronic kidney disease, or unspecified chronic kidney disease: Secondary | ICD-10-CM | POA: Diagnosis not present

## 2016-11-01 DIAGNOSIS — I5043 Acute on chronic combined systolic (congestive) and diastolic (congestive) heart failure: Secondary | ICD-10-CM | POA: Diagnosis not present

## 2016-11-01 DIAGNOSIS — E1122 Type 2 diabetes mellitus with diabetic chronic kidney disease: Secondary | ICD-10-CM | POA: Diagnosis not present

## 2016-11-01 DIAGNOSIS — Z7952 Long term (current) use of systemic steroids: Secondary | ICD-10-CM | POA: Diagnosis not present

## 2016-11-01 DIAGNOSIS — Z7901 Long term (current) use of anticoagulants: Secondary | ICD-10-CM | POA: Diagnosis not present

## 2016-11-01 DIAGNOSIS — I130 Hypertensive heart and chronic kidney disease with heart failure and stage 1 through stage 4 chronic kidney disease, or unspecified chronic kidney disease: Secondary | ICD-10-CM | POA: Diagnosis not present

## 2016-11-01 DIAGNOSIS — J45909 Unspecified asthma, uncomplicated: Secondary | ICD-10-CM | POA: Diagnosis not present

## 2016-11-01 DIAGNOSIS — N183 Chronic kidney disease, stage 3 (moderate): Secondary | ICD-10-CM | POA: Diagnosis not present

## 2016-11-01 DIAGNOSIS — E785 Hyperlipidemia, unspecified: Secondary | ICD-10-CM | POA: Diagnosis not present

## 2016-11-01 DIAGNOSIS — D509 Iron deficiency anemia, unspecified: Secondary | ICD-10-CM | POA: Diagnosis not present

## 2016-11-01 DIAGNOSIS — Z87891 Personal history of nicotine dependence: Secondary | ICD-10-CM | POA: Diagnosis not present

## 2016-11-01 DIAGNOSIS — E039 Hypothyroidism, unspecified: Secondary | ICD-10-CM | POA: Diagnosis not present

## 2016-11-02 DIAGNOSIS — Z7952 Long term (current) use of systemic steroids: Secondary | ICD-10-CM | POA: Diagnosis not present

## 2016-11-02 DIAGNOSIS — I130 Hypertensive heart and chronic kidney disease with heart failure and stage 1 through stage 4 chronic kidney disease, or unspecified chronic kidney disease: Secondary | ICD-10-CM | POA: Diagnosis not present

## 2016-11-02 DIAGNOSIS — Z7901 Long term (current) use of anticoagulants: Secondary | ICD-10-CM | POA: Diagnosis not present

## 2016-11-02 DIAGNOSIS — I5043 Acute on chronic combined systolic (congestive) and diastolic (congestive) heart failure: Secondary | ICD-10-CM | POA: Diagnosis not present

## 2016-11-02 DIAGNOSIS — E039 Hypothyroidism, unspecified: Secondary | ICD-10-CM | POA: Diagnosis not present

## 2016-11-02 DIAGNOSIS — E1122 Type 2 diabetes mellitus with diabetic chronic kidney disease: Secondary | ICD-10-CM | POA: Diagnosis not present

## 2016-11-02 DIAGNOSIS — D509 Iron deficiency anemia, unspecified: Secondary | ICD-10-CM | POA: Diagnosis not present

## 2016-11-02 DIAGNOSIS — J45909 Unspecified asthma, uncomplicated: Secondary | ICD-10-CM | POA: Diagnosis not present

## 2016-11-02 DIAGNOSIS — N183 Chronic kidney disease, stage 3 (moderate): Secondary | ICD-10-CM | POA: Diagnosis not present

## 2016-11-02 DIAGNOSIS — Z87891 Personal history of nicotine dependence: Secondary | ICD-10-CM | POA: Diagnosis not present

## 2016-11-02 DIAGNOSIS — E785 Hyperlipidemia, unspecified: Secondary | ICD-10-CM | POA: Diagnosis not present

## 2016-11-06 DIAGNOSIS — I5043 Acute on chronic combined systolic (congestive) and diastolic (congestive) heart failure: Secondary | ICD-10-CM | POA: Diagnosis not present

## 2016-11-06 DIAGNOSIS — N183 Chronic kidney disease, stage 3 (moderate): Secondary | ICD-10-CM | POA: Diagnosis not present

## 2016-11-06 DIAGNOSIS — I130 Hypertensive heart and chronic kidney disease with heart failure and stage 1 through stage 4 chronic kidney disease, or unspecified chronic kidney disease: Secondary | ICD-10-CM | POA: Diagnosis not present

## 2016-11-06 DIAGNOSIS — E039 Hypothyroidism, unspecified: Secondary | ICD-10-CM | POA: Diagnosis not present

## 2016-11-06 DIAGNOSIS — J45909 Unspecified asthma, uncomplicated: Secondary | ICD-10-CM | POA: Diagnosis not present

## 2016-11-06 DIAGNOSIS — E785 Hyperlipidemia, unspecified: Secondary | ICD-10-CM | POA: Diagnosis not present

## 2016-11-06 DIAGNOSIS — D509 Iron deficiency anemia, unspecified: Secondary | ICD-10-CM | POA: Diagnosis not present

## 2016-11-06 DIAGNOSIS — Z7952 Long term (current) use of systemic steroids: Secondary | ICD-10-CM | POA: Diagnosis not present

## 2016-11-06 DIAGNOSIS — E1122 Type 2 diabetes mellitus with diabetic chronic kidney disease: Secondary | ICD-10-CM | POA: Diagnosis not present

## 2016-11-06 DIAGNOSIS — Z87891 Personal history of nicotine dependence: Secondary | ICD-10-CM | POA: Diagnosis not present

## 2016-11-06 DIAGNOSIS — Z7901 Long term (current) use of anticoagulants: Secondary | ICD-10-CM | POA: Diagnosis not present

## 2016-11-07 DIAGNOSIS — Z7952 Long term (current) use of systemic steroids: Secondary | ICD-10-CM | POA: Diagnosis not present

## 2016-11-07 DIAGNOSIS — I5043 Acute on chronic combined systolic (congestive) and diastolic (congestive) heart failure: Secondary | ICD-10-CM | POA: Diagnosis not present

## 2016-11-07 DIAGNOSIS — E785 Hyperlipidemia, unspecified: Secondary | ICD-10-CM | POA: Diagnosis not present

## 2016-11-07 DIAGNOSIS — N183 Chronic kidney disease, stage 3 (moderate): Secondary | ICD-10-CM | POA: Diagnosis not present

## 2016-11-07 DIAGNOSIS — J45909 Unspecified asthma, uncomplicated: Secondary | ICD-10-CM | POA: Diagnosis not present

## 2016-11-07 DIAGNOSIS — I130 Hypertensive heart and chronic kidney disease with heart failure and stage 1 through stage 4 chronic kidney disease, or unspecified chronic kidney disease: Secondary | ICD-10-CM | POA: Diagnosis not present

## 2016-11-07 DIAGNOSIS — E1122 Type 2 diabetes mellitus with diabetic chronic kidney disease: Secondary | ICD-10-CM | POA: Diagnosis not present

## 2016-11-07 DIAGNOSIS — Z7901 Long term (current) use of anticoagulants: Secondary | ICD-10-CM | POA: Diagnosis not present

## 2016-11-07 DIAGNOSIS — E039 Hypothyroidism, unspecified: Secondary | ICD-10-CM | POA: Diagnosis not present

## 2016-11-07 DIAGNOSIS — Z87891 Personal history of nicotine dependence: Secondary | ICD-10-CM | POA: Diagnosis not present

## 2016-11-07 DIAGNOSIS — D509 Iron deficiency anemia, unspecified: Secondary | ICD-10-CM | POA: Diagnosis not present

## 2016-11-08 DIAGNOSIS — I5043 Acute on chronic combined systolic (congestive) and diastolic (congestive) heart failure: Secondary | ICD-10-CM | POA: Diagnosis not present

## 2016-11-08 DIAGNOSIS — Z7952 Long term (current) use of systemic steroids: Secondary | ICD-10-CM | POA: Diagnosis not present

## 2016-11-08 DIAGNOSIS — E1122 Type 2 diabetes mellitus with diabetic chronic kidney disease: Secondary | ICD-10-CM | POA: Diagnosis not present

## 2016-11-08 DIAGNOSIS — I130 Hypertensive heart and chronic kidney disease with heart failure and stage 1 through stage 4 chronic kidney disease, or unspecified chronic kidney disease: Secondary | ICD-10-CM | POA: Diagnosis not present

## 2016-11-08 DIAGNOSIS — D509 Iron deficiency anemia, unspecified: Secondary | ICD-10-CM | POA: Diagnosis not present

## 2016-11-08 DIAGNOSIS — J45909 Unspecified asthma, uncomplicated: Secondary | ICD-10-CM | POA: Diagnosis not present

## 2016-11-08 DIAGNOSIS — E785 Hyperlipidemia, unspecified: Secondary | ICD-10-CM | POA: Diagnosis not present

## 2016-11-08 DIAGNOSIS — N183 Chronic kidney disease, stage 3 (moderate): Secondary | ICD-10-CM | POA: Diagnosis not present

## 2016-11-08 DIAGNOSIS — Z87891 Personal history of nicotine dependence: Secondary | ICD-10-CM | POA: Diagnosis not present

## 2016-11-08 DIAGNOSIS — Z7901 Long term (current) use of anticoagulants: Secondary | ICD-10-CM | POA: Diagnosis not present

## 2016-11-08 DIAGNOSIS — E039 Hypothyroidism, unspecified: Secondary | ICD-10-CM | POA: Diagnosis not present

## 2016-11-09 ENCOUNTER — Ambulatory Visit (HOSPITAL_COMMUNITY): Payer: Medicare Other

## 2016-11-12 ENCOUNTER — Encounter (HOSPITAL_COMMUNITY): Payer: Medicare Other | Attending: Oncology | Admitting: Oncology

## 2016-11-12 ENCOUNTER — Encounter (HOSPITAL_COMMUNITY): Payer: Self-pay

## 2016-11-12 ENCOUNTER — Encounter (HOSPITAL_COMMUNITY): Payer: Medicare Other

## 2016-11-12 DIAGNOSIS — N189 Chronic kidney disease, unspecified: Secondary | ICD-10-CM | POA: Diagnosis not present

## 2016-11-12 DIAGNOSIS — N183 Chronic kidney disease, stage 3 (moderate): Secondary | ICD-10-CM | POA: Diagnosis not present

## 2016-11-12 DIAGNOSIS — E119 Type 2 diabetes mellitus without complications: Secondary | ICD-10-CM

## 2016-11-12 DIAGNOSIS — D5919 Other autoimmune hemolytic anemia: Secondary | ICD-10-CM

## 2016-11-12 DIAGNOSIS — D591 Other autoimmune hemolytic anemias: Secondary | ICD-10-CM | POA: Insufficient documentation

## 2016-11-12 DIAGNOSIS — D509 Iron deficiency anemia, unspecified: Secondary | ICD-10-CM | POA: Diagnosis not present

## 2016-11-12 DIAGNOSIS — D649 Anemia, unspecified: Secondary | ICD-10-CM

## 2016-11-12 DIAGNOSIS — E785 Hyperlipidemia, unspecified: Secondary | ICD-10-CM | POA: Diagnosis not present

## 2016-11-12 DIAGNOSIS — Z7901 Long term (current) use of anticoagulants: Secondary | ICD-10-CM | POA: Diagnosis not present

## 2016-11-12 DIAGNOSIS — I5043 Acute on chronic combined systolic (congestive) and diastolic (congestive) heart failure: Secondary | ICD-10-CM | POA: Diagnosis not present

## 2016-11-12 DIAGNOSIS — D589 Hereditary hemolytic anemia, unspecified: Secondary | ICD-10-CM | POA: Insufficient documentation

## 2016-11-12 DIAGNOSIS — I5042 Chronic combined systolic (congestive) and diastolic (congestive) heart failure: Secondary | ICD-10-CM | POA: Diagnosis present

## 2016-11-12 DIAGNOSIS — E039 Hypothyroidism, unspecified: Secondary | ICD-10-CM | POA: Diagnosis not present

## 2016-11-12 DIAGNOSIS — E1122 Type 2 diabetes mellitus with diabetic chronic kidney disease: Secondary | ICD-10-CM | POA: Diagnosis not present

## 2016-11-12 DIAGNOSIS — J45909 Unspecified asthma, uncomplicated: Secondary | ICD-10-CM | POA: Diagnosis not present

## 2016-11-12 DIAGNOSIS — I130 Hypertensive heart and chronic kidney disease with heart failure and stage 1 through stage 4 chronic kidney disease, or unspecified chronic kidney disease: Secondary | ICD-10-CM | POA: Diagnosis not present

## 2016-11-12 DIAGNOSIS — Z87891 Personal history of nicotine dependence: Secondary | ICD-10-CM | POA: Diagnosis not present

## 2016-11-12 DIAGNOSIS — Z7952 Long term (current) use of systemic steroids: Secondary | ICD-10-CM | POA: Diagnosis not present

## 2016-11-12 LAB — COMPREHENSIVE METABOLIC PANEL
ALBUMIN: 3.4 g/dL — AB (ref 3.5–5.0)
ALT: 45 U/L (ref 14–54)
AST: 30 U/L (ref 15–41)
Alkaline Phosphatase: 87 U/L (ref 38–126)
Anion gap: 12 (ref 5–15)
BILIRUBIN TOTAL: 3.7 mg/dL — AB (ref 0.3–1.2)
BUN: 72 mg/dL — AB (ref 6–20)
CO2: 20 mmol/L — AB (ref 22–32)
Calcium: 8.6 mg/dL — ABNORMAL LOW (ref 8.9–10.3)
Chloride: 98 mmol/L — ABNORMAL LOW (ref 101–111)
Creatinine, Ser: 2.22 mg/dL — ABNORMAL HIGH (ref 0.44–1.00)
GFR calc Af Amer: 23 mL/min — ABNORMAL LOW (ref >60)
GFR calc non Af Amer: 20 mL/min — ABNORMAL LOW (ref >60)
GLUCOSE: 298 mg/dL — AB (ref 65–99)
POTASSIUM: 4.1 mmol/L (ref 3.5–5.1)
Sodium: 130 mmol/L — ABNORMAL LOW (ref 135–145)
TOTAL PROTEIN: 6.1 g/dL — AB (ref 6.5–8.1)

## 2016-11-12 LAB — CBC WITH DIFFERENTIAL/PLATELET
BASOS PCT: 0 %
Basophils Absolute: 0 10*3/uL (ref 0.0–0.1)
EOS ABS: 0 10*3/uL (ref 0.0–0.7)
EOS PCT: 0 %
HEMATOCRIT: 27.3 % — AB (ref 36.0–46.0)
HEMOGLOBIN: 9.5 g/dL — AB (ref 12.0–15.0)
LYMPHS ABS: 0.5 10*3/uL — AB (ref 0.7–4.0)
Lymphocytes Relative: 7 %
MCH: 33.1 pg (ref 26.0–34.0)
MCHC: 34.8 g/dL (ref 30.0–36.0)
MCV: 95.1 fL (ref 78.0–100.0)
Monocytes Absolute: 0.3 10*3/uL (ref 0.1–1.0)
Monocytes Relative: 4 %
NEUTROS PCT: 89 %
Neutro Abs: 5.9 10*3/uL (ref 1.7–7.7)
Platelets: 122 10*3/uL — ABNORMAL LOW (ref 150–400)
RBC: 2.87 MIL/uL — AB (ref 3.87–5.11)
RDW: 20.4 % — AB (ref 11.5–15.5)
WBC: 6.7 10*3/uL (ref 4.0–10.5)

## 2016-11-12 MED ORDER — VITAMIN B-12 1000 MCG PO TABS: 1000.0000 ug | ORAL_TABLET | Freq: Every day | ORAL | 1 refills | Status: AC

## 2016-11-12 NOTE — Progress Notes (Signed)
Michele Walters can you call the patient's sister and let her know her hemoglobin has gone up but her platelets have gone down some. Continue prednisone at 40 mg PO daily.  Can you set up lab appointments for her to come in for weekly labs with CBC, CMP until I see her again? TY

## 2016-11-12 NOTE — Progress Notes (Signed)
[facility] Initial Visit:  Patient Care Team: Chevis Pretty, FNP as PCP - General (Nurse Practitioner) Melina Schools, OD as Consulting Physician (Optometry)  CHIEF COMPLAINTS/PURPOSE OF CONSULTATION:  Anemia  HISTORY OF PRESENTING ILLNESS: Michele Walters 81 y.o. female presents today for evaluation of anemia. She has chronic anemia in the setting of CKD with a baseline hemoglobin in the 8-9 g/dL range. Patient's hemoglobin has dropped down on 09/05/2016 to 6 g/dL required 2 units PRBC transfusion. Follow-up hemoglobin was 10.4 g/dL on 09/06/16. B-12 level was 735 on 09/04/2016. Iron studies demonstrated a ferritin of 310, serum iron 88, TIBC 277, folate 12.6 on 09/04/2016. Patient subsequently had a decrease in her hemoglobin down to 7.4 g/dL on 10/02/2016. She was again transfused 2 units PRBC. Follow-up hemoglobin on 10/04/2016 was 10.4 g/dL. Most recent hemoglobin from 10/18/2016 was 8.9 g/dL, hematocrit 25.9%, MCV 91.2, WBC 6.4, platelets 195K. patient is chronically on Eliquis. However her fecal occult blood tests have been negative. Her PCP is concerned about possible hemolytic anemia since on 8/16 patient's total bilirubin was 1.5, indirect 1, direct bilirubin 0.5. Repeat on 10/02/2016 demonstrated total bilirubin was 1.2, indirect bilirubin 0.8, direct bilirubin 0.4.  Patient states that she has been doing well. She denies any fatigue. She denies any evidence of bleeding including melena, hematochezia, hematuria, hemoptysis. She denies any chest pain, shortness breath, abdominal pain. She states she is almost finished with rehabilitation and will be leaving soon. She denies noting any scleral icterus or jaundice.  INTERVAL HISTORY: Patient presents today for continued follow-up of her anemia. Lab work performed on her last visit on 10/24/2016 demonstrated hemoglobin is 8.6 g/dL, hematocrit 24.4%, MCV 91, WBC 6.7K, platelet count 206K. Iron studies were within  normal limits, ferritin elevated at 548. Folate and vitamin B12 level were normal. Hemoglobin electrophoresis demonstrated normal hemoglobin electrophoresis. Erythropoietin 40.9. DAT IgG was positive. LDH 203. I had called in a prescription for prednisone 40 mg by mouth daily starting on 10/26/2016 patient has been taking it daily since then. Repeat CBC performed today demonstrated WBC 6.7K, hemoglobin 9.5 g/dL, hematocrit 27.3%, plate count 631S.  Patient has been discharged from rehabilitation. She states she still feels weak and tired. She complains of lower sternum the swelling. Appetite is at 50%. She drinks 3 ensures a day. Denies any pain. Denies any chest pain, shortness of breath, dominant pain.  Review of Systems - Oncology ROS as per HPI otherwise 12 point ROS is negative.  MEDICAL HISTORY: Past Medical History:  Diagnosis Date  . Allergy   . Anemia    iron def  . Cataract   . Chronic combined systolic (congestive) and diastolic (congestive) heart failure ([place])   . Diabetes mellitus without complication ([place])   . Dysphagia   . Hyperlipidemia   . Hypertension   . Muscle weakness   . Renal disorder    chroninc kidney disease  . Thyroid disease     SURGICAL HISTORY: Past Surgical History:  Procedure Laterality Date  . ABDOMINAL HYSTERECTOMY     partial but later ovaries removed  . EYE SURGERY Bilateral    cataract  . OOPHORECTOMY Bilateral     SOCIAL HISTORY: Social History   Social History  . Marital status: Single    Spouse name: N/A  . Number of children: N/A  . Years of education: N/A   Occupational History  . Retired    Social History Main Topics  . Smoking status: Former Smoker  Packs/day: 1.00    Years: 55.00    Types: Cigarettes    Quit date: 08/05/2016  . Smokeless tobacco: Never Used  . Alcohol use No  . Drug use: No  . Sexual activity: No   Other Topics Concern  . Not on file   Social History Narrative  . No narrative on file     FAMILY HISTORY Family History  Problem Relation Age of Onset  . Heart disease Mother   . Heart attack Mother        massive  . COPD Father   . Emphysema Father   . Down syndrome Brother     ALLERGIES:  has No Known Allergies.  MEDICATIONS:  Current Outpatient Prescriptions  Medication Sig Dispense Refill  . apixaban (ELIQUIS) 2.5 MG TABS tablet Take 2.5 mg by mouth 2 (two) times daily.    . bisacodyl (DULCOLAX) 5 MG EC tablet Take 2 tablets (10 mg total) by mouth daily as needed for mild constipation or moderate constipation. 30 tablet 0  . calcium carbonate (OS-CAL) 600 MG TABS tablet Take 600 mg by mouth every other day.     . carvedilol (COREG) 12.5 MG tablet Take 12.5 mg by mouth 2 (two) times daily with a meal. 1 bid 60 tablet 3  . docusate sodium (COLACE) 100 MG capsule Take 2 capsules (200 mg total) by mouth 2 (two) times daily. 10 capsule 0  . isosorbide mononitrate (IMDUR) 30 MG 24 hr tablet Take 1 tablet (30 mg total) by mouth daily. 30 tablet 0  . levothyroxine (SYNTHROID, LEVOTHROID) 88 MCG tablet Take 1 tablet (88 mcg total) by mouth daily before breakfast. 90 tablet 2  . Omega-3 Fatty Acids (FISH OIL) 1000 MG CAPS Take 1 capsule by mouth every other day.     . polyethylene glycol (MIRALAX / GLYCOLAX) packet Take 17 g by mouth daily.    . predniSONE (DELTASONE) 20 MG tablet Take 2 tablets (40 mg total) by mouth daily with breakfast. 42 tablet 0   No current facility-administered medications for this visit.     PHYSICAL EXAMINATION:   Vitals:   11/12/16 1334  BP: (!) 133/93  Pulse: 89  Resp: 16  SpO2: 100%     Physical Exam Constitutional: Thin WF in no acute distress sitting in a wheelchair.   HENT:  Head: Normocephalic and atraumatic.  Mouth/Throat: No oropharyngeal exudate. Mucosa moist. Eyes: Pupils are equal, round, and reactive to light. Conjunctivae are normal. No scleral icterus.  Neck: Normal range of motion. Neck supple. No JVD present.   Cardiovascular: Normal rate, regular rhythm and normal heart sounds.  Exam reveals no gallop and no friction rub.   No murmur heard. Pulmonary/Chest: Effort normal and breath sounds normal. No respiratory distress. No wheezes.No rales.  Abdominal: Soft. Bowel sounds are normal. No distension. There is no tenderness. There is no guarding.  Musculoskeletal: 2+ edema in bilateral LE. Lymphadenopathy:    No cervical or supraclavicular adenopathy.  Neurological: Alert and oriented to person, place, and time. No cranial nerve deficit.  Skin: Skin is warm and dry. No rash noted. No erythema. No pallor.  Psychiatric: Affect and judgment normal.   LABORATORY DATA: I have personally reviewed the data as listed:  Lab on 11/12/2016  Component Date Value Ref Range Status  . WBC 11/12/2016 6.7  4.0 - 10.5 K/uL Final  . RBC 11/12/2016 2.87* 3.87 - 5.11 MIL/uL Final  . Hemoglobin 11/12/2016 9.5* 12.0 - 15.0 g/dL Final  . HCT  11/12/2016 27.3* 36.0 - 46.0 % Final  . MCV 11/12/2016 95.1  78.0 - 100.0 fL Final  . MCH 11/12/2016 33.1  26.0 - 34.0 pg Final  . MCHC 11/12/2016 34.8  30.0 - 36.0 g/dL Final  . RDW 11/12/2016 20.4* 11.5 - 15.5 % Final  . Platelets 11/12/2016 122* 150 - 400 K/uL Final  . Neutrophils Relative % 11/12/2016 89  % Final  . Neutro Abs 11/12/2016 5.9  1.7 - 7.7 K/uL Final  . Lymphocytes Relative 11/12/2016 7  % Final  . Lymphs Abs 11/12/2016 0.5* 0.7 - 4.0 K/uL Final  . Monocytes Relative 11/12/2016 4  % Final  . Monocytes Absolute 11/12/2016 0.3  0.1 - 1.0 K/uL Final  . Eosinophils Relative 11/12/2016 0  % Final  . Eosinophils Absolute 11/12/2016 0.0  0.0 - 0.7 K/uL Final  . Basophils Relative 11/12/2016 0  % Final  . Basophils Absolute 11/12/2016 0.0  0.0 - 0.1 K/uL Final  . Sodium 11/12/2016 130* 135 - 145 mmol/L Final  . Potassium 11/12/2016 4.1  3.5 - 5.1 mmol/L Final  . Chloride 11/12/2016 98* 101 - 111 mmol/L Final  . CO2 11/12/2016 20* 22 - 32 mmol/L Final  . Glucose,  Bld 11/12/2016 298* 65 - 99 mg/dL Final  . BUN 11/12/2016 72* 6 - 20 mg/dL Final  . Creatinine, Ser 11/12/2016 2.22* 0.44 - 1.00 mg/dL Final  . Calcium 11/12/2016 8.6* 8.9 - 10.3 mg/dL Final  . Total Protein 11/12/2016 6.1* 6.5 - 8.1 g/dL Final  . Albumin 11/12/2016 3.4* 3.5 - 5.0 g/dL Final  . AST 11/12/2016 30  15 - 41 U/L Final  . ALT 11/12/2016 45  14 - 54 U/L Final  . Alkaline Phosphatase 11/12/2016 87  38 - 126 U/L Final  . Total Bilirubin 11/12/2016 3.7* 0.3 - 1.2 mg/dL Final  . GFR calc non Af Amer 11/12/2016 20* >60 mL/min Final  . GFR calc Af Amer 11/12/2016 23* >60 mL/min Final   Comment: (NOTE) The eGFR has been calculated using the CKD EPI equation. This calculation has not been validated in all clinical situations. eGFR's persistently <60 mL/min signify possible Chronic Kidney Disease.   . Anion gap 11/12/2016 12  5 - 15 Final  Admission on 09/19/2016, Discharged on 10/27/2016  Component Date Value Ref Range Status  . ABO/RH(D) 10/02/2016 AB POS   Final  . Antibody Screen 10/02/2016 POS   Final  . Sample Expiration 10/02/2016 10/05/2016   Final  . DAT, IgG 10/02/2016 POS   Final  . Antibody Identification 25/00/3704 NON SPECIFIC ANTIBODY REACTIVITY   Final  . Antibody ID,T [place] 10/02/2016 WARM AUTOANTIBODY   Final  . Unit Number 10/02/2016 [id number]   Final  . Blood Component Type 10/02/2016 RED CELLS,LR   Final  . Unit division 10/02/2016 00   Final  . Status of Unit 10/02/2016 ISSUED,FINAL   Final  . Donor AG Type 10/02/2016 NEGATIVE FOR E ANTIGEN NEGATIVE FOR KELL ANTIGEN NEGATIVE FOR KIDD B ANTIGEN NEGATIVE FOR S ANTIGEN   Final  . Transfusion Status 10/02/2016 OK TO TRANSFUSE   Final  . Crossmatch Result 10/02/2016 COMPATIBLE   Final  . Unit Number 10/02/2016 [id number]   Final  . Blood Component Type 10/02/2016 RED CELLS,LR   Final  . Unit division 10/02/2016 00   Final  . Status of Unit 10/02/2016 ISSUED,FINAL   Final  . Donor AG Type 10/02/2016  NEGATIVE FOR E ANTIGEN NEGATIVE FOR KELL ANTIGEN NEGATIVE FOR KIDD B ANTIGEN  NEGATIVE FOR S ANTIGEN   Final  . Transfusion Status 10/02/2016 OK TO TRANSFUSE   Final  . Crossmatch Result 10/02/2016 COMPATIBLE   Final  . Hemoglobin 10/02/2016 8.1* 12.0 - 15.0 g/dL Final  . HCT 10/02/2016 23.1* 36.0 - 46.0 % Final  . ISSUE DATE / TIME 10/02/2016 [id number]   Final  . Blood Product Unit Number 10/02/2016 [id number]   Final  . PRODUCT CODE 10/02/2016 [id number]   Final  . Unit Type and Rh 10/02/2016 5100   Final  . Blood Product Expiration Date 10/02/2016 [id number]   Final  . ISSUE DATE / TIME 10/02/2016 [id number]   Final  . Blood Product Unit Number 10/02/2016 [id number]   Final  . PRODUCT CODE 10/02/2016 [id number]   Final  . Unit Type and Rh 10/02/2016 6200   Final  . Blood Product Expiration Date 10/02/2016 [id number]   Final  Hospital Outpatient Visit on 10/24/2016  Component Date Value Ref Range Status  . DAT, complement 10/24/2016 NOT NEEDED   Final  . DAT, IgG 10/24/2016 POS   Final  Appointment on 10/24/2016  Component Date Value Ref Range Status  . WBC 10/24/2016 6.7  4.0 - 10.5 K/uL Final  . RBC 10/24/2016 2.68* 3.87 - 5.11 MIL/uL Final  . Hemoglobin 10/24/2016 8.6* 12.0 - 15.0 g/dL Final  . HCT 10/24/2016 24.4* 36.0 - 46.0 % Final  . MCV 10/24/2016 91.0  78.0 - 100.0 fL Final  . MCH 10/24/2016 32.1  26.0 - 34.0 pg Final  . MCHC 10/24/2016 35.2  30.0 - 36.0 g/dL Final  . RDW 10/24/2016 15.7* 11.5 - 15.5 % Final  . Platelets 10/24/2016 206  150 - 400 K/uL Final  . Neutrophils Relative % 10/24/2016 50  % Final  . Neutro Abs 10/24/2016 3.4  1.7 - 7.7 K/uL Final  . Lymphocytes Relative 10/24/2016 34  % Final  . Lymphs Abs 10/24/2016 2.3  0.7 - 4.0 K/uL Final  . Monocytes Relative 10/24/2016 10  % Final  . Monocytes Absolute 10/24/2016 0.6  0.1 - 1.0 K/uL Final  . Eosinophils Relative 10/24/2016 5  % Final  . Eosinophils Absolute 10/24/2016 0.3  0.0 - 0.7 K/uL Final   . Basophils Relative 10/24/2016 1  % Final  . Basophils Absolute 10/24/2016 0.1  0.0 - 0.1 K/uL Final  . Sodium 10/24/2016 133* 135 - 145 mmol/L Final  . Potassium 10/24/2016 4.5  3.5 - 5.1 mmol/L Final  . Chloride 10/24/2016 101  101 - 111 mmol/L Final  . CO2 10/24/2016 24  22 - 32 mmol/L Final  . Glucose, Bld 10/24/2016 206* 65 - 99 mg/dL Final  . BUN 10/24/2016 52* 6 - 20 mg/dL Final  . Creatinine, Ser 10/24/2016 2.02* 0.44 - 1.00 mg/dL Final  . Calcium 10/24/2016 8.4* 8.9 - 10.3 mg/dL Final  . Total Protein 10/24/2016 6.7  6.5 - 8.1 g/dL Final  . Albumin 10/24/2016 3.1* 3.5 - 5.0 g/dL Final  . AST 10/24/2016 21  15 - 41 U/L Final  . ALT 10/24/2016 13* 14 - 54 U/L Final  . Alkaline Phosphatase 10/24/2016 74  38 - 126 U/L Final  . Total Bilirubin 10/24/2016 1.1  0.3 - 1.2 mg/dL Final  . GFR calc non Af Amer 10/24/2016 22* >60 mL/min Final  . GFR calc Af Amer 10/24/2016 26* >60 mL/min Final   Comment: (NOTE) The eGFR has been calculated using the CKD EPI equation. This calculation has not been validated in all clinical situations. eGFR's persistently <60  mL/min signify possible Chronic Kidney Disease.   . Anion gap 10/24/2016 8  5 - 15 Final  . LDH 10/24/2016 203* 98 - 192 U/L Final  . Haptoglobin 10/24/2016 10* 34 - 200 mg/dL Final   Comment: (NOTE) Performed At: [facility] [place], [place] [id number] Lindon Romp MD [id number]   . Folate 10/24/2016 10.1  >5.9 ng/mL Final   Performed at [facility], [place] [address], [place], [place] [address]  . Vitamin B-12 10/24/2016 278  180 - 914 pg/mL Final   Comment: (NOTE) This assay is not validated for testing neonatal or myeloproliferative syndrome specimens for Vitamin B12 levels. Performed at [facility], [place] [address], [place], [place] [address]   . Ferritin 10/24/2016 548* 11 - 307 ng/mL Final   Performed at [facility], [place] [address] [place], [place] [address]  . Iron  10/24/2016 64  28 - 170 ug/dL Final  . TIBC 10/24/2016 242* 250 - 450 ug/dL Final  . Saturation Ratios 10/24/2016 26  10.4 - 31.8 % Final  . UIBC 10/24/2016 178  ug/dL Final   Performed at [facility], [place] [address], [place], [place] [address]  . Erythropoietin 10/24/2016 40.9* 2.6 - 18.5 mIU/mL Final   Comment: (NOTE) [employer] UniCel DxI 800 Immunoassay System Performed At: [facility] [place], [place] [id number] Lindon Romp MD [id number]   . Retic Ct Pct 10/24/2016 5.5* 0.4 - 3.1 % Final  . RBC. 10/24/2016 2.68* 3.87 - 5.11 MIL/uL Final  . Retic Count, Absolute 10/24/2016 147.4  19.0 - 186.0 K/uL Final  . Hgb A2 Quant 10/24/2016 2.2  1.8 - 3.2 % Final  . Hgb F Quant 10/24/2016 0.0  0.0 - 2.0 % Final  . Hgb S Quant 10/24/2016 0.0  0.0 % Final  . Hgb C 10/24/2016 0.0  0.0 % Corrected  . Hgb A 10/24/2016 97.8  96.4 - 98.8 % Final  . Hgb Variant 10/24/2016 0.0  0.0 % Corrected  . Please Note: 10/24/2016 Comment   Corrected   Comment: (NOTE) Normal adult hemoglobin present. Performed At: [facility] [place], [place] [id number] Lindon Romp MD [id number]   . IgG (Immunoglobin G), Serum 10/24/2016 1412  700 - 1,600 mg/dL Final  . IgA 10/24/2016 601* 64 - 422 mg/dL Final  . IgM (Immunoglobulin M), Srm 10/24/2016 34  26 - 217 mg/dL Final  . Total Protein ELP 10/24/2016 6.4  6.0 - 8.5 g/dL Corrected  . Albumin SerPl Elph-Mcnc 10/24/2016 3.0  2.9 - 4.4 g/dL Corrected  . Alpha 1 10/24/2016 0.2  0.0 - 0.4 g/dL Corrected  . Alpha2 Glob SerPl Elph-Mcnc 10/24/2016 0.6  0.4 - 1.0 g/dL Corrected  . B-Globulin SerPl Elph-Mcnc 10/24/2016 1.1  0.7 - 1.3 g/dL Corrected  . Gamma Glob SerPl Elph-Mcnc 10/24/2016 1.5  0.4 - 1.8 g/dL Corrected  . M Protein SerPl Elph-Mcnc 10/24/2016 Not Observed  Not Observed g/dL Corrected  . Globulin, Total 10/24/2016 3.4  2.2 - 3.9 g/dL Corrected  . Albumin/Glob SerPl 10/24/2016 0.9  0.7 -  1.7 Corrected  . IFE 1 10/24/2016 Comment   Corrected   Comment: (NOTE) An apparent polyclonal gammopathy: IgA. Kappa and lambda typing appear increased.   . Please Note 10/24/2016 Comment   Corrected   Comment: (NOTE) Protein electrophoresis scan will follow via computer, mail, or courier delivery. Performed At: [facility] [place],  [place] [id number] Lindon Romp MD [id number]   Office Visit on 10/18/2016  Component Date Value Ref Range Status  . WBC 10/18/2016 6.4  3.8 - 10.8 Thousand/uL Final  . RBC 10/18/2016 2.84* 3.80 - 5.10 Million/uL Final  . Hemoglobin 10/18/2016 8.9* 11.7 - 15.5 g/dL Final  . HCT 10/18/2016 25.9* 35.0 - 45.0 % Final  . MCV 10/18/2016 91.2  80.0 - 100.0 fL Final  . MCH 10/18/2016 31.3  27.0 - 33.0 pg Final  . MCHC 10/18/2016 34.4  32.0 - 36.0 g/dL Final  . RDW 10/18/2016 15.5* 11.0 - 15.0 % Final  . Platelets 10/18/2016 195  140 - 400 Thousand/uL Final  . MPV 10/18/2016 8.8  7.5 - 12.5 fL Final  . Neutro Abs 10/18/2016 3104  1,500 - 7,800 cells/uL Final  . Lymphs Abs 10/18/2016 2464  850 - 3,900 cells/uL Final  . WBC mixed population 10/18/2016 550  200 - 950 cells/uL Final  . Eosinophils Absolute 10/18/2016 198  15 - 500 cells/uL Final  . Basophils Absolute 10/18/2016 83  0 - 200 cells/uL Final  . Neutrophils Relative % 10/18/2016 48.5  % Final  . Total Lymphocyte 10/18/2016 38.5  % Final  . Monocytes Relative 10/18/2016 8.6  % Final  . Eosinophils Relative 10/18/2016 3.1  % Final  . Basophils Relative 10/18/2016 1.3  % Final    RADIOGRAPHIC STUDIES: I have personally reviewed the radiological images as listed and agree with the findings in the report  No results found.  ASSESSMENT/PLAN Normocytic anemia CKD On chronic anticoagulation with eliquis. Recent fecal occult negative. Anemia workup demonstrated positive DAT IgG, borderline low normal B12, otherwise normal workup.   PLAN: Continue prednisone 40 mg  by mouth daily for treatment of hemolytic anemia. Hemoglobin has improved up to 9.5 g/dL. We'll continue to monitor her hemolytic anemia with weekly CBC, CMP. We will start tapering her prednisone once her hemoglobin gets up closer to her baseline which is 10.5 g/dL. She also likely has a component of anemia from chronic renal disease. Oral vitamin B12 1000 g by mouth daily called into her pharmacy. Patient will need close follow-up with her PCP for monitoring of her diabetes while she is on prednisone should cause worsening hyperglycemia. Return to clinic in 4 weeks for follow-up with labs.  Orders Placed This Encounter  Procedures  . CBC with Differential    Standing Status:   Future    Number of Occurrences:   1    Standing Expiration Date:   11/12/2017  . Comprehensive metabolic panel    Standing Status:   Future    Number of Occurrences:   1    Standing Expiration Date:   11/12/2017    All questions were answered. The patient knows to call the clinic with any problems, questions or concerns.  This note was electronically signed.    Twana First, MD  11/12/2016 3:04 PM

## 2016-11-13 ENCOUNTER — Other Ambulatory Visit (HOSPITAL_COMMUNITY): Payer: Self-pay | Admitting: Emergency Medicine

## 2016-11-13 DIAGNOSIS — D599 Acquired hemolytic anemia, unspecified: Secondary | ICD-10-CM

## 2016-11-13 DIAGNOSIS — D509 Iron deficiency anemia, unspecified: Secondary | ICD-10-CM | POA: Diagnosis not present

## 2016-11-13 DIAGNOSIS — R2689 Other abnormalities of gait and mobility: Secondary | ICD-10-CM | POA: Diagnosis not present

## 2016-11-13 DIAGNOSIS — M5002 Cervical disc disorder with myelopathy, mid-cervical region, unspecified level: Secondary | ICD-10-CM | POA: Diagnosis not present

## 2016-11-13 DIAGNOSIS — E1140 Type 2 diabetes mellitus with diabetic neuropathy, unspecified: Secondary | ICD-10-CM | POA: Diagnosis not present

## 2016-11-14 ENCOUNTER — Telehealth: Payer: Self-pay | Admitting: Nurse Practitioner

## 2016-11-14 ENCOUNTER — Encounter: Payer: Self-pay | Admitting: Nurse Practitioner

## 2016-11-14 ENCOUNTER — Ambulatory Visit (INDEPENDENT_AMBULATORY_CARE_PROVIDER_SITE_OTHER): Payer: Medicare Other | Admitting: Nurse Practitioner

## 2016-11-14 VITALS — BP 131/93 | HR 91 | Temp 96.8°F | Ht 62.0 in

## 2016-11-14 DIAGNOSIS — R5383 Other fatigue: Secondary | ICD-10-CM

## 2016-11-14 DIAGNOSIS — Z23 Encounter for immunization: Secondary | ICD-10-CM | POA: Diagnosis not present

## 2016-11-14 DIAGNOSIS — D649 Anemia, unspecified: Secondary | ICD-10-CM | POA: Diagnosis not present

## 2016-11-14 LAB — HEMOGLOBIN, FINGERSTICK: Hemoglobin: 10.5 g/dL — ABNORMAL LOW (ref 11.1–15.9)

## 2016-11-14 MED ORDER — HEMOCYTE PLUS 106-1 MG PO CAPS: 1.0000 | ORAL_CAPSULE | Freq: Every day | ORAL | 5 refills | Status: AC

## 2016-11-14 NOTE — Patient Instructions (Signed)

## 2016-11-14 NOTE — Progress Notes (Signed)
   Subjective:    Patient ID: Michele Walters, female    DOB: 1935/07/28, 81 y.o.   MRN: [id number]  HPI Patient is here today for nursing home follow up. SHe was in hospital in august and was discharged to nursing home. SHe had PT for foot drop. SHe came home 3 weeks ago and had been doing well. Over the last 3 weeks there has been a slow decline in her ability todo anything. Has no energy. Her nephew said that she got several units of blood while in nursing home. They are not sure where she is loosing blood.     Review of Systems  Constitutional: Positive for appetite change (decreased).  HENT: Negative.   Respiratory: Negative.   Cardiovascular: Negative.   Gastrointestinal: Negative.   Genitourinary: Negative.   Neurological: Positive for weakness.  Psychiatric/Behavioral: Negative.   All other systems reviewed and are negative.      Objective:   Physical Exam  Constitutional: She is oriented to person, place, and time. She appears well-developed and well-nourished. No distress.  Cardiovascular: Normal rate and regular rhythm.   Pulmonary/Chest: Effort normal and breath sounds normal.  Abdominal: Soft. Bowel sounds are normal.  Musculoskeletal:  Right foot drop  Neurological: She is alert and oriented to person, place, and time.  Skin: Skin is warm. There is pallor.  Psychiatric: She has a normal mood and affect. Her behavior is normal. Judgment and thought content normal.   BP (!) 131/93   Pulse 91   Temp (!) 96.8 F (36 C) (Oral)   Ht '5\' 2"'$  (1.575 m)   hgb 10.5    Assessment & Plan:   1. Low hemoglobin   2. Fatigue, unspecified type    Meds ordered this encounter  Medications  . Fe Fum-FA-B Cmp-C-Zn-Mg-Mn-Cu (HEMOCYTE PLUS) 106-1 MG CAPS    Sig: Take 1 capsule by mouth daily.    Dispense:  30 each    Refill:  5    Order Specific Question:   Supervising Provider    Answer:   Evette Doffing, CAROL L [[id number]]   Orders Placed This Encounter  Procedures  . Hemoglobin,  fingerstick  . CMP14+EGFR  . Anemia Profile B   Rest Fall precautions Follow up in 1 month  [place], FNP

## 2016-11-15 ENCOUNTER — Ambulatory Visit: Payer: Medicare Other | Admitting: *Deleted

## 2016-11-15 ENCOUNTER — Emergency Department (HOSPITAL_COMMUNITY)
Admission: EM | Admit: 2016-11-15 | Discharge: 2016-11-15 | Disposition: A | Discharge status: Home / Self Care | Payer: Medicare Other | Attending: Emergency Medicine | Admitting: Emergency Medicine

## 2016-11-15 ENCOUNTER — Telehealth: Payer: Self-pay | Admitting: *Deleted

## 2016-11-15 ENCOUNTER — Encounter (HOSPITAL_COMMUNITY): Payer: Self-pay

## 2016-11-15 DIAGNOSIS — E785 Hyperlipidemia, unspecified: Secondary | ICD-10-CM | POA: Diagnosis not present

## 2016-11-15 DIAGNOSIS — N183 Chronic kidney disease, stage 3 (moderate): Secondary | ICD-10-CM | POA: Insufficient documentation

## 2016-11-15 DIAGNOSIS — J45909 Unspecified asthma, uncomplicated: Secondary | ICD-10-CM | POA: Diagnosis not present

## 2016-11-15 DIAGNOSIS — R600 Localized edema: Secondary | ICD-10-CM

## 2016-11-15 DIAGNOSIS — W19XXXA Unspecified fall, initial encounter: Secondary | ICD-10-CM

## 2016-11-15 DIAGNOSIS — Z87891 Personal history of nicotine dependence: Secondary | ICD-10-CM | POA: Diagnosis not present

## 2016-11-15 DIAGNOSIS — Z7901 Long term (current) use of anticoagulants: Secondary | ICD-10-CM | POA: Diagnosis not present

## 2016-11-15 DIAGNOSIS — E039 Hypothyroidism, unspecified: Secondary | ICD-10-CM | POA: Diagnosis not present

## 2016-11-15 DIAGNOSIS — I5043 Acute on chronic combined systolic (congestive) and diastolic (congestive) heart failure: Secondary | ICD-10-CM | POA: Diagnosis not present

## 2016-11-15 DIAGNOSIS — I5042 Chronic combined systolic (congestive) and diastolic (congestive) heart failure: Secondary | ICD-10-CM | POA: Diagnosis not present

## 2016-11-15 DIAGNOSIS — R5383 Other fatigue: Secondary | ICD-10-CM | POA: Diagnosis not present

## 2016-11-15 DIAGNOSIS — I130 Hypertensive heart and chronic kidney disease with heart failure and stage 1 through stage 4 chronic kidney disease, or unspecified chronic kidney disease: Secondary | ICD-10-CM | POA: Diagnosis not present

## 2016-11-15 DIAGNOSIS — Z79899 Other long term (current) drug therapy: Secondary | ICD-10-CM | POA: Diagnosis not present

## 2016-11-15 DIAGNOSIS — E1122 Type 2 diabetes mellitus with diabetic chronic kidney disease: Secondary | ICD-10-CM | POA: Diagnosis not present

## 2016-11-15 DIAGNOSIS — R531 Weakness: Secondary | ICD-10-CM | POA: Diagnosis not present

## 2016-11-15 DIAGNOSIS — R296 Repeated falls: Secondary | ICD-10-CM | POA: Insufficient documentation

## 2016-11-15 DIAGNOSIS — D509 Iron deficiency anemia, unspecified: Secondary | ICD-10-CM | POA: Diagnosis not present

## 2016-11-15 DIAGNOSIS — Z7952 Long term (current) use of systemic steroids: Secondary | ICD-10-CM | POA: Diagnosis not present

## 2016-11-15 HISTORY — DX: Repeated falls: R29.6

## 2016-11-15 LAB — ANEMIA PROFILE B
Basophils Absolute: 0 10*3/uL (ref 0.0–0.2)
Basos: 0 %
EOS (ABSOLUTE): 0 10*3/uL (ref 0.0–0.4)
EOS: 0 %
FERRITIN: 445 ng/mL — AB (ref 15–150)
Folate: 7.5 ng/mL (ref >3.0)
HEMOGLOBIN: 10.1 g/dL — AB (ref 11.1–15.9)
Hematocrit: 30.9 % — ABNORMAL LOW (ref 34.0–46.6)
IMMATURE GRANS (ABS): 0 10*3/uL (ref 0.0–0.1)
IRON SATURATION: 54 % (ref 15–55)
Immature Granulocytes: 0 %
Iron: 111 ug/dL (ref 27–139)
LYMPHS: 6 %
Lymphocytes Absolute: 0.5 10*3/uL — ABNORMAL LOW (ref 0.7–3.1)
MCH: 32.2 pg (ref 26.6–33.0)
MCHC: 32.7 g/dL (ref 31.5–35.7)
MCV: 98 fL — ABNORMAL HIGH (ref 79–97)
Monocytes Absolute: 0.7 10*3/uL (ref 0.1–0.9)
Monocytes: 9 %
NEUTROS PCT: 85 %
Neutrophils Absolute: 6.3 10*3/uL (ref 1.4–7.0)
Platelets: 106 10*3/uL — ABNORMAL LOW (ref 150–379)
RBC: 3.14 x10E6/uL — AB (ref 3.77–5.28)
RDW: 18.7 % — ABNORMAL HIGH (ref 12.3–15.4)
RETIC CT PCT: 7 % — AB (ref 0.6–2.6)
TIBC: 205 ug/dL — AB (ref 250–450)
UIBC: 94 ug/dL — AB (ref 118–369)
Vitamin B-12: 819 pg/mL (ref 232–1245)
WBC: 7.4 10*3/uL (ref 3.4–10.8)

## 2016-11-15 LAB — COMPREHENSIVE METABOLIC PANEL
ALT: 36 U/L (ref 14–54)
AST: 24 U/L (ref 15–41)
Albumin: 3.3 g/dL — ABNORMAL LOW (ref 3.5–5.0)
Alkaline Phosphatase: 80 U/L (ref 38–126)
Anion gap: 14 (ref 5–15)
BILIRUBIN TOTAL: 3.6 mg/dL — AB (ref 0.3–1.2)
BUN: 85 mg/dL — AB (ref 6–20)
CHLORIDE: 97 mmol/L — AB (ref 101–111)
CO2: 19 mmol/L — ABNORMAL LOW (ref 22–32)
CREATININE: 2.2 mg/dL — AB (ref 0.44–1.00)
Calcium: 8.5 mg/dL — ABNORMAL LOW (ref 8.9–10.3)
GFR, EST AFRICAN AMERICAN: 23 mL/min — AB (ref >60)
GFR, EST NON AFRICAN AMERICAN: 20 mL/min — AB (ref >60)
Glucose, Bld: 281 mg/dL — ABNORMAL HIGH (ref 65–99)
POTASSIUM: 3.8 mmol/L (ref 3.5–5.1)
SODIUM: 130 mmol/L — AB (ref 135–145)
TOTAL PROTEIN: 6.1 g/dL — AB (ref 6.5–8.1)

## 2016-11-15 LAB — CBC
HCT: 29.7 % — ABNORMAL LOW (ref 36.0–46.0)
Hemoglobin: 10.2 g/dL — ABNORMAL LOW (ref 12.0–15.0)
MCH: 33.2 pg (ref 26.0–34.0)
MCHC: 34.3 g/dL (ref 30.0–36.0)
MCV: 96.7 fL (ref 78.0–100.0)
PLATELETS: 94 10*3/uL — AB (ref 150–400)
RBC: 3.07 MIL/uL — AB (ref 3.87–5.11)
RDW: 19.9 % — AB (ref 11.5–15.5)
WBC: 5.9 10*3/uL (ref 4.0–10.5)

## 2016-11-15 LAB — CMP14+EGFR
A/G RATIO: 1.5 (ref 1.2–2.2)
ALK PHOS: 89 IU/L (ref 39–117)
ALT: 39 IU/L — ABNORMAL HIGH (ref 0–32)
AST: 25 IU/L (ref 0–40)
Albumin: 3.6 g/dL (ref 3.5–4.8)
BUN/Creatinine Ratio: 33 — ABNORMAL HIGH (ref 12–28)
BUN: 79 mg/dL (ref 8–27)
Bilirubin Total: 3.2 mg/dL — ABNORMAL HIGH (ref 0.0–1.2)
CO2: 17 mmol/L — AB (ref 20–29)
Calcium: 8.6 mg/dL — ABNORMAL LOW (ref 8.7–10.3)
Chloride: 94 mmol/L — ABNORMAL LOW (ref 96–106)
Creatinine, Ser: 2.37 mg/dL — ABNORMAL HIGH (ref 0.57–1.00)
GFR calc Af Amer: 22 mL/min/{1.73_m2} — ABNORMAL LOW (ref >59)
GFR calc non Af Amer: 19 mL/min/{1.73_m2} — ABNORMAL LOW (ref >59)
GLOBULIN, TOTAL: 2.4 g/dL (ref 1.5–4.5)
Glucose: 257 mg/dL — ABNORMAL HIGH (ref 65–99)
POTASSIUM: 4.1 mmol/L (ref 3.5–5.2)
SODIUM: 131 mmol/L — AB (ref 134–144)
Total Protein: 6 g/dL (ref 6.0–8.5)

## 2016-11-15 NOTE — Telephone Encounter (Signed)
Rcvd VM from [facility] Pt seen yesterday by Mary-Margaret Today pt has right leg edema with weeping fluid, is weak, is not on any fluid meds Please advise and call [facility] nurse

## 2016-11-15 NOTE — ED Triage Notes (Signed)
Patient brought in by [facility] for frequent falls and bilateral legs swelling. Patient denies falling today or any pain. States she does not live alone and has help at home. Reports of leaking area to right lower leg since last stay at hospital. A&Ox4.

## 2016-11-15 NOTE — Telephone Encounter (Signed)
Patient was taken to hospital

## 2016-11-15 NOTE — ED Provider Notes (Signed)
[facility]-EMERGENCY DEPT Provider Note   CSN: [id number] Arrival date & time: 11/15/16  1333     History   Chief Complaint Chief Complaint  Patient presents with  . Fall    HPI Michele Walters is a 81 y.o. female.  HPI Patient presents to the emergency department with increasing generalized weakness and more frequent falls.  Family states that she has become weaker over the past 4-6 weeks and has had more frequent falls.  She previously was at a rehabilitation facility was walking some with a walker.  Family is concerned about some increasing lower extremity swelling.  Home health physical therapy is working with her several times a week.  No fevers.  No vomiting.  No diarrhea.  Eating and drinking normally.  Patient had a minor fall today resulting in a skin tear of her left forearm without active bleeding.  No head injury or neck pain.  Denies chest pain and back pain.  No abdominal pain.  She denies weakness of her arms or legs.  Family reports normal mental status.  Family is most concerned about the lower [place] swelling.  No history DVT or pulmonary embolism.  No pleuritic chest pain.  No history of congestive heart failure   Past Medical History:  Diagnosis Date  . Allergy   . Anemia    iron def  . Cataract   . Chronic combined systolic (congestive) and diastolic (congestive) heart failure (HCC)   . Diabetes mellitus without complication (HCC)   . Dysphagia   . Frequent falls   . Hyperlipidemia   . Hypertension   . Muscle weakness   . Renal disorder    chroninc kidney disease  . Thyroid disease     Patient Active Problem List   Diagnosis Date Noted  . Hemolytic anemia (HCC) 11/12/2016  . Aortic aneurysm (HCC) 10/01/2016  . Right foot drop 10/01/2016  . Elevated bilirubin 09/16/2016  . Abnormal nuclear stress test 09/12/2016  . Paroxysmal atrial fibrillation (HCC) 09/10/2016  . Benign positional vertigo 09/10/2016  . Normocytic anemia 09/04/2016  . Elevated  troponin 09/04/2016  . Muscular deconditioning 09/04/2016  . Chronic combined systolic (congestive) and diastolic (congestive) heart failure (HCC) 09/04/2016  . Chest pain   . LBBB (left bundle branch block)   . Acute hypercapnic respiratory failure (HCC) 08/04/2016  . Acute respiratory failure with hypoxia (HCC)   . Smoker 11/23/2014  . CKD (chronic kidney disease) stage 3, GFR 30-59 ml/min (HCC) 10/20/2014  . DM2 (diabetes mellitus, type 2) (HCC) 10/15/2012  . Hypertensive heart disease with CHF (congestive heart failure) (HCC) 05/06/2010  . Hypothyroidism 05/06/2010  . Hyperlipidemia 05/06/2010    Past Surgical History:  Procedure Laterality Date  . ABDOMINAL HYSTERECTOMY     partial but later ovaries removed  . EYE SURGERY Bilateral    cataract  . OOPHORECTOMY Bilateral     OB History    No data available       Home Medications    Prior to Admission medications   Medication Sig Start Date End Date Taking? Authorizing Provider  apixaban (ELIQUIS) 2.5 MG TABS tablet Take 2.5 mg by mouth 2 (two) times daily.   Yes [provider]  bisacodyl (DULCOLAX) 5 MG EC tablet Take 2 tablets (10 mg total) by mouth daily as needed for mild constipation or moderate constipation. 09/08/16  Yes Houston Siren, MD  calcium carbonate (OS-CAL) 600 MG TABS tablet Take 600 mg by mouth every other day.    Yes  [provider]  carvedilol (COREG) 12.5 MG tablet Take 12.5 mg by mouth 2 (two) times daily with a meal. 1 bid 09/10/16  Yes Pecola Lawless, MD  docusate sodium (COLACE) 100 MG capsule Take 2 capsules (200 mg total) by mouth 2 (two) times daily. 08/08/16  Yes Noralee Stain Chahn-Yang, DO  Fe Fum-FA-B Cmp-C-Zn-Mg-Mn-Cu (HEMOCYTE PLUS) 106-1 MG CAPS Take 1 capsule by mouth daily. 11/14/16  Yes Martin, Mary-Margaret, FNP  isosorbide mononitrate (IMDUR) 30 MG 24 hr tablet Take 1 tablet (30 mg total) by mouth daily. 08/09/16  Yes Noralee Stain Chahn-Yang, DO  levothyroxine (SYNTHROID,  LEVOTHROID) 88 MCG tablet Take 1 tablet (88 mcg total) by mouth daily before breakfast. 04/23/16  Yes Daphine Deutscher, Mary-Margaret, FNP  Omega-3 Fatty Acids (FISH OIL) 1000 MG CAPS Take 1 capsule by mouth every other day.    Yes [provider]  polyethylene glycol (MIRALAX / GLYCOLAX) packet Take 17 g by mouth daily.   Yes [provider]  predniSONE (DELTASONE) 20 MG tablet Take 2 tablets (40 mg total) by mouth daily with breakfast. 10/26/16 11/16/16 Yes Ralene Cork, MD  vitamin B-12 (CYANOCOBALAMIN) 1000 MCG tablet Take 1 tablet (1,000 mcg total) by mouth daily. 11/12/16  Yes Ralene Cork, MD    Family History Family History  Problem Relation Age of Onset  . Heart disease Mother   . Heart attack Mother        massive  . COPD Father   . Emphysema Father   . Down syndrome Brother     Social History Social History  Substance Use Topics  . Smoking status: Former Smoker    Packs/day: 1.00    Years: 55.00    Types: Cigarettes    Quit date: 08/05/2016  . Smokeless tobacco: Never Used  . Alcohol use No     Allergies   Patient has no known allergies.   Review of Systems Review of Systems  All other systems reviewed and are negative.    Physical Exam Updated Vital Signs BP (!) 137/96   Pulse 64   Temp (!) 97.5 F (36.4 C) (Oral)   Resp 15   Ht  (1.575 m)   Wt 52.6 kg (116 lb)   SpO2 100%   BMI 21.22 kg/m   Physical Exam  Constitutional: She is oriented to person, place, and time. She appears well-developed and well-nourished.  HENT:  Head: Normocephalic.  Eyes: EOM are normal.  Neck: Normal range of motion.  Cardiovascular: Normal rate and regular rhythm.   Pulmonary/Chest: Effort normal.  Abdominal: Soft. She exhibits no distension. There is no tenderness.  Musculoskeletal: Normal range of motion.  2+ pitting edema bilaterally.  Full range of motion bilateral shoulders, elbows, wrists.  Full range of motion bilateral hips, knees, ankles.  Small  skin tear left posterior forearm without active bleeding.  Neurological: She is alert and oriented to person, place, and time.  Skin: Skin is warm.  Psychiatric: She has a normal mood and affect.  Nursing note and vitals reviewed.    ED Treatments / Results  Labs (all labs ordered are listed, but only abnormal results are displayed) Labs Reviewed  CBC - Abnormal; Notable for the following:       Result Value   RBC 3.07 (*)    Hemoglobin 10.2 (*)    HCT 29.7 (*)    RDW 19.9 (*)    Platelets 94 (*)    All other components within normal limits  COMPREHENSIVE METABOLIC  PANEL - Abnormal; Notable for the following:    Sodium 130 (*)    Chloride 97 (*)    CO2 19 (*)    Glucose, Bld 281 (*)    BUN 85 (*)    Creatinine, Ser 2.20 (*)    Calcium 8.5 (*)    Total Protein 6.1 (*)    Albumin 3.3 (*)    Total Bilirubin 3.6 (*)    GFR calc non Af Amer 20 (*)    GFR calc Af Amer 23 (*)    All other components within normal limits   BUN  Date Value Ref Range Status  11/15/2016 85 (H) 6 - 20 mg/dL Final  21/30/8657 79 (HH) 8 - 27 mg/dL Final  84/69/6295 72 (H) 6 - 20 mg/dL Final  28/41/3244 52 (H) 6 - 20 mg/dL Final  02/07/7251 31 (H) 6 - 20 mg/dL Final  66/44/0347 27 8 - 27 mg/dL Final  42/59/5638 35 (H) 8 - 27 mg/dL Final  75/64/3329 25 8 - 27 mg/dL Final   Creatinine, Ser  Date Value Ref Range Status  11/15/2016 2.20 (H) 0.44 - 1.00 mg/dL Final  51/88/4166 0.63 (H) 0.57 - 1.00 mg/dL Final  01/60/1093 2.35 (H) 0.44 - 1.00 mg/dL Final  57/32/2025 4.27 (H) 0.44 - 1.00 mg/dL Final   Hemoglobin  Date Value Ref Range Status  11/15/2016 10.2 (L) 12.0 - 15.0 g/dL Final  07/29/7626 31.5 (L) 11.1 - 15.9 g/dL Final  17/61/6073 9.5 (L) 12.0 - 15.0 g/dL Final  71/07/2692 8.6 (L) 12.0 - 15.0 g/dL Final  85/46/2703 8.9 (L) 11.7 - 15.5 g/dL Final     EKG  EKG Interpretation None       Radiology No results found.  Procedures Procedures (including critical care  time)  Medications Ordered in ED Medications - No data to display   Initial Impression / Assessment and Plan / ED Course  I have reviewed the triage vital signs and the nursing notes.  Pertinent labs & imaging results that were available during my care of the patient were reviewed by me and considered in my medical decision making (see chart for details).     Baseline renal insufficiency.  Baseline anemia.  Overall well-appearing.  Full range of motion in major joints.  C-spine nontender.  No indication for imaging of head or neck.  In regards to her lower extreme swelling I think this is venous insufficiency.  Doubt DVT.  Recommended elevation of her legs and decreased sodium diet.  Recommended compression stockings and primary care follow-up.  I suspect her increasing weakness is due to deconditioning.  No recommended that she speak with her home health team about developing additional physical therapy opportunities and potentially involving home health social work for placement if her weakness continues to decline.  Close primary care follow-up.  Patient and family understand return to the ER for new or worsening symptoms  Final Clinical Impressions(s) / ED Diagnoses   Final diagnoses:  Fall, initial encounter  Lower extremity edema    New Prescriptions New Prescriptions   No medications on file     Azalia Bilis, MD 11/15/16 1639

## 2016-11-15 NOTE — Discharge Instructions (Signed)
Please elevate legs  Please wear compression stockings to the lower extremities  Follow up closely with your primary care team  Return to the ER for any new or worsening symptoms

## 2016-11-17 ENCOUNTER — Telehealth: Payer: Self-pay | Admitting: Nurse Practitioner

## 2016-11-17 NOTE — Telephone Encounter (Signed)
Spoke with patient's sister Gray Bernhardt. She is concerned about her ability to live alone safely. States that she has frequent falls and is weak and unstable. She is taking care of her now but isn't physically able to lift and move her. Believes that she would benefit from placement in an adult care facility and would like to start that process. Explained that would require FL2 paperwork, preferably by the patient's PCP or another provider that is directly familiar with her, and location of a facility that is able to take her in. Explained that we are understaffed to handle that during the Saturday acute care clinic. I will be glad to forward this request to her PCP for review on Monday or if she feels that sooner placement or evaluation is necessary she should go through the ED. Reba was very understanding and will wait until Monday unless things worsen.   I will forward this to Bennie Pierini, FNP for further review.

## 2016-11-19 ENCOUNTER — Encounter: Payer: Self-pay | Admitting: Nurse Practitioner

## 2016-11-20 ENCOUNTER — Other Ambulatory Visit (HOSPITAL_COMMUNITY): Payer: Medicare Other

## 2016-11-20 DIAGNOSIS — E039 Hypothyroidism, unspecified: Secondary | ICD-10-CM | POA: Diagnosis not present

## 2016-11-20 DIAGNOSIS — I5043 Acute on chronic combined systolic (congestive) and diastolic (congestive) heart failure: Secondary | ICD-10-CM | POA: Diagnosis not present

## 2016-11-20 DIAGNOSIS — I130 Hypertensive heart and chronic kidney disease with heart failure and stage 1 through stage 4 chronic kidney disease, or unspecified chronic kidney disease: Secondary | ICD-10-CM | POA: Diagnosis not present

## 2016-11-20 DIAGNOSIS — J45909 Unspecified asthma, uncomplicated: Secondary | ICD-10-CM | POA: Diagnosis not present

## 2016-11-20 DIAGNOSIS — Z87891 Personal history of nicotine dependence: Secondary | ICD-10-CM | POA: Diagnosis not present

## 2016-11-20 DIAGNOSIS — Z7952 Long term (current) use of systemic steroids: Secondary | ICD-10-CM | POA: Diagnosis not present

## 2016-11-20 DIAGNOSIS — D509 Iron deficiency anemia, unspecified: Secondary | ICD-10-CM | POA: Diagnosis not present

## 2016-11-20 DIAGNOSIS — N183 Chronic kidney disease, stage 3 (moderate): Secondary | ICD-10-CM | POA: Diagnosis not present

## 2016-11-20 DIAGNOSIS — Z7901 Long term (current) use of anticoagulants: Secondary | ICD-10-CM | POA: Diagnosis not present

## 2016-11-20 DIAGNOSIS — E1122 Type 2 diabetes mellitus with diabetic chronic kidney disease: Secondary | ICD-10-CM | POA: Diagnosis not present

## 2016-11-20 DIAGNOSIS — E785 Hyperlipidemia, unspecified: Secondary | ICD-10-CM | POA: Diagnosis not present

## 2016-11-20 NOTE — Telephone Encounter (Signed)
I have attempted to call on several occasions and have been unable  To reach her. Filled out papers yesterday for nuring facility.

## 2016-11-21 DIAGNOSIS — Z7901 Long term (current) use of anticoagulants: Secondary | ICD-10-CM | POA: Diagnosis not present

## 2016-11-21 DIAGNOSIS — R05 Cough: Secondary | ICD-10-CM | POA: Diagnosis not present

## 2016-11-21 DIAGNOSIS — K5900 Constipation, unspecified: Secondary | ICD-10-CM | POA: Diagnosis not present

## 2016-11-21 DIAGNOSIS — Z66 Do not resuscitate: Secondary | ICD-10-CM | POA: Diagnosis not present

## 2016-11-21 DIAGNOSIS — R001 Bradycardia, unspecified: Secondary | ICD-10-CM | POA: Diagnosis not present

## 2016-11-21 DIAGNOSIS — A419 Sepsis, unspecified organism: Secondary | ICD-10-CM | POA: Diagnosis not present

## 2016-11-21 DIAGNOSIS — E785 Hyperlipidemia, unspecified: Secondary | ICD-10-CM | POA: Diagnosis not present

## 2016-11-21 DIAGNOSIS — M21371 Foot drop, right foot: Secondary | ICD-10-CM | POA: Diagnosis not present

## 2016-11-21 DIAGNOSIS — Z515 Encounter for palliative care: Secondary | ICD-10-CM | POA: Diagnosis not present

## 2016-11-21 DIAGNOSIS — E7800 Pure hypercholesterolemia, unspecified: Secondary | ICD-10-CM | POA: Diagnosis not present

## 2016-11-21 DIAGNOSIS — L8990 Pressure ulcer of unspecified site, unspecified stage: Secondary | ICD-10-CM | POA: Diagnosis not present

## 2016-11-21 DIAGNOSIS — I4891 Unspecified atrial fibrillation: Secondary | ICD-10-CM | POA: Diagnosis not present

## 2016-11-21 DIAGNOSIS — E119 Type 2 diabetes mellitus without complications: Secondary | ICD-10-CM | POA: Diagnosis not present

## 2016-11-21 DIAGNOSIS — R748 Abnormal levels of other serum enzymes: Secondary | ICD-10-CM | POA: Diagnosis not present

## 2016-11-21 DIAGNOSIS — K819 Cholecystitis, unspecified: Secondary | ICD-10-CM | POA: Diagnosis not present

## 2016-11-21 DIAGNOSIS — E039 Hypothyroidism, unspecified: Secondary | ICD-10-CM | POA: Diagnosis not present

## 2016-11-21 DIAGNOSIS — G4700 Insomnia, unspecified: Secondary | ICD-10-CM | POA: Diagnosis not present

## 2016-11-21 DIAGNOSIS — R17 Unspecified jaundice: Secondary | ICD-10-CM | POA: Diagnosis not present

## 2016-11-21 DIAGNOSIS — E1100 Type 2 diabetes mellitus with hyperosmolarity without nonketotic hyperglycemic-hyperosmolar coma (NKHHC): Secondary | ICD-10-CM | POA: Diagnosis not present

## 2016-11-21 DIAGNOSIS — N183 Chronic kidney disease, stage 3 (moderate): Secondary | ICD-10-CM | POA: Diagnosis not present

## 2016-11-21 DIAGNOSIS — I509 Heart failure, unspecified: Secondary | ICD-10-CM | POA: Diagnosis not present

## 2016-11-21 DIAGNOSIS — R6520 Severe sepsis without septic shock: Secondary | ICD-10-CM | POA: Diagnosis not present

## 2016-11-21 DIAGNOSIS — I2510 Atherosclerotic heart disease of native coronary artery without angina pectoris: Secondary | ICD-10-CM | POA: Diagnosis not present

## 2016-11-21 DIAGNOSIS — Z8249 Family history of ischemic heart disease and other diseases of the circulatory system: Secondary | ICD-10-CM | POA: Diagnosis not present

## 2016-11-21 DIAGNOSIS — Z7401 Bed confinement status: Secondary | ICD-10-CM | POA: Diagnosis not present

## 2016-11-21 DIAGNOSIS — I5043 Acute on chronic combined systolic (congestive) and diastolic (congestive) heart failure: Secondary | ICD-10-CM | POA: Diagnosis not present

## 2016-11-21 DIAGNOSIS — R935 Abnormal findings on diagnostic imaging of other abdominal regions, including retroperitoneum: Secondary | ICD-10-CM | POA: Diagnosis not present

## 2016-11-21 DIAGNOSIS — D689 Coagulation defect, unspecified: Secondary | ICD-10-CM | POA: Diagnosis not present

## 2016-11-21 DIAGNOSIS — I480 Paroxysmal atrial fibrillation: Secondary | ICD-10-CM | POA: Diagnosis not present

## 2016-11-21 DIAGNOSIS — Z79899 Other long term (current) drug therapy: Secondary | ICD-10-CM | POA: Diagnosis not present

## 2016-11-21 DIAGNOSIS — J9690 Respiratory failure, unspecified, unspecified whether with hypoxia or hypercapnia: Secondary | ICD-10-CM | POA: Diagnosis not present

## 2016-11-21 DIAGNOSIS — E1101 Type 2 diabetes mellitus with hyperosmolarity with coma: Secondary | ICD-10-CM | POA: Diagnosis not present

## 2016-11-21 DIAGNOSIS — E559 Vitamin D deficiency, unspecified: Secondary | ICD-10-CM | POA: Diagnosis not present

## 2016-11-21 DIAGNOSIS — Z87891 Personal history of nicotine dependence: Secondary | ICD-10-CM | POA: Diagnosis not present

## 2016-11-21 DIAGNOSIS — Z743 Need for continuous supervision: Secondary | ICD-10-CM | POA: Diagnosis not present

## 2016-11-21 DIAGNOSIS — M6281 Muscle weakness (generalized): Secondary | ICD-10-CM | POA: Diagnosis not present

## 2016-11-21 DIAGNOSIS — E1122 Type 2 diabetes mellitus with diabetic chronic kidney disease: Secondary | ICD-10-CM | POA: Diagnosis not present

## 2016-11-21 DIAGNOSIS — J189 Pneumonia, unspecified organism: Secondary | ICD-10-CM | POA: Diagnosis not present

## 2016-11-21 DIAGNOSIS — I130 Hypertensive heart and chronic kidney disease with heart failure and stage 1 through stage 4 chronic kidney disease, or unspecified chronic kidney disease: Secondary | ICD-10-CM | POA: Diagnosis not present

## 2016-11-21 DIAGNOSIS — R531 Weakness: Secondary | ICD-10-CM | POA: Diagnosis not present

## 2016-11-21 DIAGNOSIS — D518 Other vitamin B12 deficiency anemias: Secondary | ICD-10-CM | POA: Diagnosis not present

## 2016-11-21 DIAGNOSIS — R5381 Other malaise: Secondary | ICD-10-CM | POA: Diagnosis not present

## 2016-11-21 DIAGNOSIS — I110 Hypertensive heart disease with heart failure: Secondary | ICD-10-CM | POA: Diagnosis not present

## 2016-11-21 DIAGNOSIS — D649 Anemia, unspecified: Secondary | ICD-10-CM | POA: Diagnosis not present

## 2016-11-21 DIAGNOSIS — Z79890 Hormone replacement therapy (postmenopausal): Secondary | ICD-10-CM | POA: Diagnosis not present

## 2016-11-21 DIAGNOSIS — R279 Unspecified lack of coordination: Secondary | ICD-10-CM | POA: Diagnosis not present

## 2016-11-21 NOTE — Telephone Encounter (Signed)
Pt is going to nursing facility so doesn't need B12 injections.

## 2016-11-22 DIAGNOSIS — E785 Hyperlipidemia, unspecified: Secondary | ICD-10-CM | POA: Diagnosis not present

## 2016-11-22 DIAGNOSIS — M6281 Muscle weakness (generalized): Secondary | ICD-10-CM | POA: Diagnosis not present

## 2016-11-22 DIAGNOSIS — I4891 Unspecified atrial fibrillation: Secondary | ICD-10-CM | POA: Diagnosis not present

## 2016-11-22 DIAGNOSIS — I509 Heart failure, unspecified: Secondary | ICD-10-CM | POA: Diagnosis not present

## 2016-11-23 DIAGNOSIS — I4891 Unspecified atrial fibrillation: Secondary | ICD-10-CM | POA: Diagnosis not present

## 2016-11-23 DIAGNOSIS — E785 Hyperlipidemia, unspecified: Secondary | ICD-10-CM | POA: Diagnosis not present

## 2016-11-23 DIAGNOSIS — E119 Type 2 diabetes mellitus without complications: Secondary | ICD-10-CM | POA: Diagnosis not present

## 2016-11-23 DIAGNOSIS — M6281 Muscle weakness (generalized): Secondary | ICD-10-CM | POA: Diagnosis not present

## 2016-11-27 ENCOUNTER — Other Ambulatory Visit (HOSPITAL_COMMUNITY): Payer: Medicare Other

## 2016-11-30 NOTE — Telephone Encounter (Signed)
Patient has already been put in nursing facility

## 2016-12-04 ENCOUNTER — Inpatient Hospital Stay (HOSPITAL_COMMUNITY)
Admission: EM | Admit: 2016-12-04 | Discharge: 2017-01-05 | DRG: 871 | Disposition: E | Payer: Medicare Other | Attending: Family Medicine | Admitting: Family Medicine

## 2016-12-04 ENCOUNTER — Other Ambulatory Visit (HOSPITAL_COMMUNITY): Payer: Medicare Other

## 2016-12-04 ENCOUNTER — Encounter (HOSPITAL_COMMUNITY): Payer: Self-pay | Admitting: Emergency Medicine

## 2016-12-04 ENCOUNTER — Emergency Department (HOSPITAL_COMMUNITY): Payer: Medicare Other

## 2016-12-04 ENCOUNTER — Other Ambulatory Visit: Payer: Self-pay

## 2016-12-04 DIAGNOSIS — K8020 Calculus of gallbladder without cholecystitis without obstruction: Secondary | ICD-10-CM | POA: Diagnosis not present

## 2016-12-04 DIAGNOSIS — L8990 Pressure ulcer of unspecified site, unspecified stage: Secondary | ICD-10-CM | POA: Diagnosis present

## 2016-12-04 DIAGNOSIS — R17 Unspecified jaundice: Secondary | ICD-10-CM | POA: Diagnosis not present

## 2016-12-04 DIAGNOSIS — J9690 Respiratory failure, unspecified, unspecified whether with hypoxia or hypercapnia: Secondary | ICD-10-CM | POA: Diagnosis present

## 2016-12-04 DIAGNOSIS — I480 Paroxysmal atrial fibrillation: Secondary | ICD-10-CM | POA: Diagnosis present

## 2016-12-04 DIAGNOSIS — I5043 Acute on chronic combined systolic (congestive) and diastolic (congestive) heart failure: Secondary | ICD-10-CM | POA: Diagnosis present

## 2016-12-04 DIAGNOSIS — Z8249 Family history of ischemic heart disease and other diseases of the circulatory system: Secondary | ICD-10-CM | POA: Diagnosis not present

## 2016-12-04 DIAGNOSIS — I509 Heart failure, unspecified: Secondary | ICD-10-CM

## 2016-12-04 DIAGNOSIS — E1122 Type 2 diabetes mellitus with diabetic chronic kidney disease: Secondary | ICD-10-CM | POA: Diagnosis not present

## 2016-12-04 DIAGNOSIS — Z66 Do not resuscitate: Secondary | ICD-10-CM | POA: Diagnosis not present

## 2016-12-04 DIAGNOSIS — D649 Anemia, unspecified: Secondary | ICD-10-CM | POA: Diagnosis not present

## 2016-12-04 DIAGNOSIS — Z87891 Personal history of nicotine dependence: Secondary | ICD-10-CM

## 2016-12-04 DIAGNOSIS — E039 Hypothyroidism, unspecified: Secondary | ICD-10-CM | POA: Diagnosis not present

## 2016-12-04 DIAGNOSIS — R7989 Other specified abnormal findings of blood chemistry: Secondary | ICD-10-CM

## 2016-12-04 DIAGNOSIS — R001 Bradycardia, unspecified: Secondary | ICD-10-CM | POA: Diagnosis not present

## 2016-12-04 DIAGNOSIS — Z7901 Long term (current) use of anticoagulants: Secondary | ICD-10-CM

## 2016-12-04 DIAGNOSIS — E785 Hyperlipidemia, unspecified: Secondary | ICD-10-CM | POA: Diagnosis present

## 2016-12-04 DIAGNOSIS — Z515 Encounter for palliative care: Secondary | ICD-10-CM | POA: Diagnosis not present

## 2016-12-04 DIAGNOSIS — R935 Abnormal findings on diagnostic imaging of other abdominal regions, including retroperitoneum: Secondary | ICD-10-CM

## 2016-12-04 DIAGNOSIS — E0800 Diabetes mellitus due to underlying condition with hyperosmolarity without nonketotic hyperglycemic-hyperosmolar coma (NKHHC): Secondary | ICD-10-CM | POA: Diagnosis not present

## 2016-12-04 DIAGNOSIS — E1100 Type 2 diabetes mellitus with hyperosmolarity without nonketotic hyperglycemic-hyperosmolar coma (NKHHC): Secondary | ICD-10-CM | POA: Diagnosis present

## 2016-12-04 DIAGNOSIS — E1101 Type 2 diabetes mellitus with hyperosmolarity with coma: Secondary | ICD-10-CM

## 2016-12-04 DIAGNOSIS — I130 Hypertensive heart and chronic kidney disease with heart failure and stage 1 through stage 4 chronic kidney disease, or unspecified chronic kidney disease: Secondary | ICD-10-CM | POA: Diagnosis present

## 2016-12-04 DIAGNOSIS — N183 Chronic kidney disease, stage 3 (moderate): Secondary | ICD-10-CM | POA: Diagnosis present

## 2016-12-04 DIAGNOSIS — R531 Weakness: Secondary | ICD-10-CM | POA: Diagnosis present

## 2016-12-04 DIAGNOSIS — J189 Pneumonia, unspecified organism: Secondary | ICD-10-CM | POA: Diagnosis not present

## 2016-12-04 DIAGNOSIS — R5381 Other malaise: Secondary | ICD-10-CM | POA: Diagnosis not present

## 2016-12-04 DIAGNOSIS — I110 Hypertensive heart disease with heart failure: Secondary | ICD-10-CM | POA: Diagnosis not present

## 2016-12-04 DIAGNOSIS — Z79890 Hormone replacement therapy (postmenopausal): Secondary | ICD-10-CM

## 2016-12-04 DIAGNOSIS — R778 Other specified abnormalities of plasma proteins: Secondary | ICD-10-CM | POA: Diagnosis present

## 2016-12-04 DIAGNOSIS — A419 Sepsis, unspecified organism: Secondary | ICD-10-CM | POA: Diagnosis not present

## 2016-12-04 DIAGNOSIS — K5900 Constipation, unspecified: Secondary | ICD-10-CM | POA: Diagnosis present

## 2016-12-04 DIAGNOSIS — R6520 Severe sepsis without septic shock: Secondary | ICD-10-CM | POA: Diagnosis not present

## 2016-12-04 DIAGNOSIS — R109 Unspecified abdominal pain: Secondary | ICD-10-CM | POA: Diagnosis present

## 2016-12-04 DIAGNOSIS — K819 Cholecystitis, unspecified: Secondary | ICD-10-CM | POA: Diagnosis not present

## 2016-12-04 DIAGNOSIS — J449 Chronic obstructive pulmonary disease, unspecified: Secondary | ICD-10-CM | POA: Diagnosis not present

## 2016-12-04 DIAGNOSIS — R0989 Other specified symptoms and signs involving the circulatory and respiratory systems: Secondary | ICD-10-CM

## 2016-12-04 DIAGNOSIS — R748 Abnormal levels of other serum enzymes: Secondary | ICD-10-CM | POA: Diagnosis not present

## 2016-12-04 DIAGNOSIS — R279 Unspecified lack of coordination: Secondary | ICD-10-CM | POA: Diagnosis not present

## 2016-12-04 DIAGNOSIS — R0602 Shortness of breath: Secondary | ICD-10-CM | POA: Diagnosis not present

## 2016-12-04 DIAGNOSIS — Z743 Need for continuous supervision: Secondary | ICD-10-CM | POA: Diagnosis not present

## 2016-12-04 DIAGNOSIS — R05 Cough: Secondary | ICD-10-CM | POA: Diagnosis not present

## 2016-12-04 LAB — CBC WITH DIFFERENTIAL/PLATELET
Basophils Absolute: 0 10*3/uL (ref 0.0–0.1)
Basophils Relative: 0 %
Eosinophils Absolute: 0 10*3/uL (ref 0.0–0.7)
Eosinophils Relative: 0 %
HEMATOCRIT: 31.4 % — AB (ref 36.0–46.0)
HEMOGLOBIN: 10.8 g/dL — AB (ref 12.0–15.0)
LYMPHS ABS: 0.4 10*3/uL — AB (ref 0.7–4.0)
LYMPHS PCT: 5 %
MCH: 32.8 pg (ref 26.0–34.0)
MCHC: 34.4 g/dL (ref 30.0–36.0)
MCV: 95.4 fL (ref 78.0–100.0)
MONOS PCT: 2 %
Monocytes Absolute: 0.2 10*3/uL (ref 0.1–1.0)
NEUTROS PCT: 93 %
Neutro Abs: 7.6 10*3/uL (ref 1.7–7.7)
Platelets: 104 10*3/uL — ABNORMAL LOW (ref 150–400)
RBC: 3.29 MIL/uL — ABNORMAL LOW (ref 3.87–5.11)
RDW: 17.1 % — ABNORMAL HIGH (ref 11.5–15.5)
WBC: 8.2 10*3/uL (ref 4.0–10.5)

## 2016-12-04 LAB — COMPREHENSIVE METABOLIC PANEL
ALBUMIN: 2.2 g/dL — AB (ref 3.5–5.0)
ALK PHOS: 166 U/L — AB (ref 38–126)
ALT: 20 U/L (ref 14–54)
ANION GAP: 13 (ref 5–15)
AST: 24 U/L (ref 15–41)
BUN: 99 mg/dL — AB (ref 6–20)
CALCIUM: 8.4 mg/dL — AB (ref 8.9–10.3)
CO2: 18 mmol/L — ABNORMAL LOW (ref 22–32)
Chloride: 98 mmol/L — ABNORMAL LOW (ref 101–111)
Creatinine, Ser: 1.92 mg/dL — ABNORMAL HIGH (ref 0.44–1.00)
GFR calc Af Amer: 27 mL/min — ABNORMAL LOW (ref >60)
GFR calc non Af Amer: 24 mL/min — ABNORMAL LOW (ref >60)
GLUCOSE: 522 mg/dL — AB (ref 65–99)
Potassium: 5 mmol/L (ref 3.5–5.1)
Sodium: 129 mmol/L — ABNORMAL LOW (ref 135–145)
TOTAL PROTEIN: 5.3 g/dL — AB (ref 6.5–8.1)
Total Bilirubin: 2.7 mg/dL — ABNORMAL HIGH (ref 0.3–1.2)

## 2016-12-04 LAB — URINALYSIS, ROUTINE W REFLEX MICROSCOPIC
BACTERIA UA: NONE SEEN
Bilirubin Urine: NEGATIVE
GLUCOSE, UA: 150 mg/dL — AB
Hgb urine dipstick: NEGATIVE
Ketones, ur: NEGATIVE mg/dL
Leukocytes, UA: NEGATIVE
Nitrite: NEGATIVE
PH: 5 (ref 5.0–8.0)
Protein, ur: 30 mg/dL — AB
RBC / HPF: NONE SEEN RBC/hpf (ref 0–5)
SPECIFIC GRAVITY, URINE: 1.019 (ref 1.005–1.030)

## 2016-12-04 LAB — BASIC METABOLIC PANEL
Anion gap: 13 (ref 5–15)
BUN: 98 mg/dL — AB (ref 6–20)
CALCIUM: 8.1 mg/dL — AB (ref 8.9–10.3)
CHLORIDE: 98 mmol/L — AB (ref 101–111)
CO2: 16 mmol/L — ABNORMAL LOW (ref 22–32)
CREATININE: 1.86 mg/dL — AB (ref 0.44–1.00)
GFR calc non Af Amer: 25 mL/min — ABNORMAL LOW (ref >60)
GFR, EST AFRICAN AMERICAN: 29 mL/min — AB (ref >60)
Glucose, Bld: 410 mg/dL — ABNORMAL HIGH (ref 65–99)
Potassium: 4.2 mmol/L (ref 3.5–5.1)
Sodium: 127 mmol/L — ABNORMAL LOW (ref 135–145)

## 2016-12-04 LAB — TROPONIN I: TROPONIN I: 0.1 ng/mL — AB (ref <0.03)

## 2016-12-04 LAB — I-STAT TROPONIN, ED: TROPONIN I, POC: 0.14 ng/mL — AB (ref 0.00–0.08)

## 2016-12-04 LAB — CBG MONITORING, ED: GLUCOSE-CAPILLARY: 447 mg/dL — AB (ref 65–99)

## 2016-12-04 LAB — MAGNESIUM: Magnesium: 1.8 mg/dL (ref 1.7–2.4)

## 2016-12-04 LAB — BRAIN NATRIURETIC PEPTIDE: B Natriuretic Peptide: >4500 pg/mL — ABNORMAL HIGH (ref 0.0–100.0)

## 2016-12-04 LAB — LIPASE, BLOOD: LIPASE: 34 U/L (ref 11–51)

## 2016-12-04 MED ORDER — LEVOTHYROXINE SODIUM 88 MCG PO TABS
88.0000 ug | ORAL_TABLET | Freq: Every day | ORAL | Status: DC
  Filled 2016-12-04: qty 1

## 2016-12-04 MED ORDER — SODIUM CHLORIDE 0.9 % IV SOLN: INTRAVENOUS | Status: DC

## 2016-12-04 MED ORDER — FUROSEMIDE 10 MG/ML IJ SOLN
40.0000 mg | Freq: Every day | INTRAMUSCULAR | Status: DC
  Administered 2016-12-05: 40 mg via INTRAVENOUS
  Filled 2016-12-04: qty 4

## 2016-12-04 MED ORDER — ISOSORBIDE MONONITRATE ER 30 MG PO TB24
30.0000 mg | ORAL_TABLET | Freq: Every day | ORAL | Status: DC
  Filled 2016-12-04: qty 1

## 2016-12-04 MED ORDER — LISINOPRIL 5 MG PO TABS
2.5000 mg | ORAL_TABLET | Freq: Every day | ORAL | Status: DC
  Filled 2016-12-04: qty 1

## 2016-12-04 MED ORDER — CALCIUM CARBONATE 1250 (500 CA) MG PO TABS
600.0000 mg | ORAL_TABLET | ORAL | Status: DC
  Filled 2016-12-04: qty 1

## 2016-12-04 MED ORDER — FERROUS FUMARATE 324 (106 FE) MG PO TABS
1.0000 | ORAL_TABLET | Freq: Every day | ORAL | Status: DC
  Filled 2016-12-04: qty 1

## 2016-12-04 MED ORDER — APIXABAN 2.5 MG PO TABS
2.5000 mg | ORAL_TABLET | Freq: Two times a day (BID) | ORAL | Status: DC
  Administered 2016-12-05: 2.5 mg via ORAL
  Filled 2016-12-04 (×7): qty 1

## 2016-12-04 MED ORDER — ZINC SULFATE 220 (50 ZN) MG PO CAPS
220.0000 mg | ORAL_CAPSULE | Freq: Every morning | ORAL | Status: DC
  Filled 2016-12-04: qty 1

## 2016-12-04 MED ORDER — SODIUM CHLORIDE 0.9 % IV SOLN
INTRAVENOUS | Status: DC
  Administered 2016-12-04: 23:00:00 via INTRAVENOUS

## 2016-12-04 MED ORDER — VITAMIN C 500 MG PO TABS
500.0000 mg | ORAL_TABLET | Freq: Two times a day (BID) | ORAL | Status: DC
  Administered 2016-12-04: 500 mg via ORAL
  Filled 2016-12-04: qty 1

## 2016-12-04 MED ORDER — BISACODYL 5 MG PO TBEC: 10.0000 mg | DELAYED_RELEASE_TABLET | Freq: Every day | ORAL | Status: DC | PRN

## 2016-12-04 MED ORDER — GUAIFENESIN ER 600 MG PO TB12
600.0000 mg | ORAL_TABLET | Freq: Two times a day (BID) | ORAL | Status: DC
  Administered 2016-12-04: 600 mg via ORAL
  Filled 2016-12-04: qty 1

## 2016-12-04 MED ORDER — IPRATROPIUM-ALBUTEROL 0.5-2.5 (3) MG/3ML IN SOLN
3.0000 mL | Freq: Four times a day (QID) | RESPIRATORY_TRACT | Status: DC | PRN
  Administered 2016-12-05: 3 mL via RESPIRATORY_TRACT
  Filled 2016-12-04: qty 3

## 2016-12-04 MED ORDER — DEXTROSE-NACL 5-0.45 % IV SOLN
INTRAVENOUS | Status: DC
  Administered 2016-12-05: 03:00:00 via INTRAVENOUS

## 2016-12-04 MED ORDER — POLYETHYLENE GLYCOL 3350 17 G PO PACK: 17.0000 g | PACK | Freq: Every day | ORAL | Status: DC | PRN

## 2016-12-04 MED ORDER — LORAZEPAM 2 MG/ML IJ SOLN
0.5000 mg | Freq: Once | INTRAMUSCULAR | Status: AC
  Administered 2016-12-05: 0.5 mg via INTRAVENOUS
  Filled 2016-12-04: qty 1

## 2016-12-04 MED ORDER — DEXTROSE 50 % IV SOLN: 25.0000 mL | INTRAVENOUS | Status: DC | PRN

## 2016-12-04 MED ORDER — CARVEDILOL 12.5 MG PO TABS
25.0000 mg | ORAL_TABLET | Freq: Every day | ORAL | Status: DC
  Filled 2016-12-04: qty 2

## 2016-12-04 MED ORDER — PRO-STAT 64 PO LIQD
30.0000 mL | Freq: Two times a day (BID) | ORAL | Status: DC
  Administered 2016-12-04: 30 mL via ORAL
  Filled 2016-12-04: qty 30

## 2016-12-04 MED ORDER — SODIUM CHLORIDE 0.9 % IV SOLN
INTRAVENOUS | Status: DC
  Administered 2016-12-04: 3.9 [IU]/h via INTRAVENOUS
  Administered 2016-12-04: 3.2 [IU]/h via INTRAVENOUS
  Filled 2016-12-04 (×2): qty 1

## 2016-12-04 MED ORDER — VITAMIN D 1000 UNITS PO TABS
2000.0000 [IU] | ORAL_TABLET | Freq: Every morning | ORAL | Status: DC
  Filled 2016-12-04: qty 2

## 2016-12-04 MED ORDER — FUROSEMIDE 10 MG/ML IJ SOLN
40.0000 mg | Freq: Once | INTRAMUSCULAR | Status: AC
  Administered 2016-12-04: 40 mg via INTRAVENOUS
  Filled 2016-12-04: qty 4

## 2016-12-04 NOTE — ED Triage Notes (Signed)
[facility]ray's done at facility.  Chest showed possible pneumonia and abd ultrasound showed possible cholecystitis.  Pt denies any complaints other than she cannot have a bowel movement.

## 2016-12-04 NOTE — ED Provider Notes (Signed)
[facility] EMERGENCY DEPARTMENT Provider Note   CSN: [id number] Arrival date & time: 12/28/2016  1645     History   Chief Complaint Chief Complaint  Patient presents with  . Abnormal Lab    xray    HPI Michele Walters is a 81 y.o. female.  HPI Patient has been having trouble with intermittent abdominal pain.  Patient states she has pain that comes and go around the periumbilical area.  She cannot think of anything that particular triggers it.  She currently is not having any pain.  She has not had recent vomiting or diarrhea.  She does feel like she has had some trouble with constipation and has not had a good bowel movement in a couple of days.  Patient denies any fevers but does admit to having a cough.  She denies any shortness of breath.  Patient is a resident of [facility] nursing facility.  According to the records they sent her to the emergency room for evaluation of acute cholecystitis associated with pneumonia.  I reviewed the imaging results that they sent.  Patient had an abdominal ultrasound on October 22.  Ultrasound showed gallbladder wall thickening without cholelithiasis.  No definite ascites.  No pericholecystic fluid was mentioned.  The impression suggested concerning findings for acute cholecystitis.  The radiologist recommended correlation with a HIDA scan.  Patient also had a chest x-ray on October 19.  If the results of that showed diffuse right lung infiltrates.  There were no notes sent from this facility indicating any treatment based on those test results performed over a week ago. Past Medical History:  Diagnosis Date  . Allergy   . Anemia    iron def  . Cataract   . Chronic combined systolic (congestive) and diastolic (congestive) heart failure (HCC)   . Diabetes mellitus without complication (HCC)   . Dysphagia   . Frequent falls   . Hyperlipidemia   . Hypertension   . Muscle weakness   . Renal disorder    chroninc kidney disease  . Thyroid  disease     Patient Active Problem List   Diagnosis Date Noted  . Hemolytic anemia (HCC) 11/12/2016  . Aortic aneurysm (HCC) 10/01/2016  . Right foot drop 10/01/2016  . Elevated bilirubin 09/16/2016  . Abnormal nuclear stress test 09/12/2016  . Paroxysmal atrial fibrillation (HCC) 09/10/2016  . Benign positional vertigo 09/10/2016  . Normocytic anemia 09/04/2016  . Elevated troponin 09/04/2016  . Muscular deconditioning 09/04/2016  . Chronic combined systolic (congestive) and diastolic (congestive) heart failure (HCC) 09/04/2016  . Chest pain   . LBBB (left bundle branch block)   . Acute hypercapnic respiratory failure (HCC) 08/04/2016  . Acute respiratory failure with hypoxia (HCC)   . Smoker 11/23/2014  . CKD (chronic kidney disease) stage 3, GFR 30-59 ml/min (HCC) 10/20/2014  . DM2 (diabetes mellitus, type 2) (HCC) 10/15/2012  . Hypertensive heart disease with CHF (congestive heart failure) (HCC) 05/06/2010  . Hypothyroidism 05/06/2010  . Hyperlipidemia 05/06/2010    Past Surgical History:  Procedure Laterality Date  . ABDOMINAL HYSTERECTOMY     partial but later ovaries removed  . EYE SURGERY Bilateral    cataract  . OOPHORECTOMY Bilateral     OB History    No data available       Home Medications    Prior to Admission medications   Medication Sig Start Date End Date Taking? Authorizing Provider  Amino Acids-Protein Hydrolys (FEEDING SUPPLEMENT, PRO-STAT 64,) LIQD Take 30  mLs by mouth 2 (two) times daily.   Yes [provider]  apixaban (ELIQUIS) 2.5 MG TABS tablet Take 2.5 mg by mouth 2 (two) times daily.   Yes [provider]  bisacodyl (DULCOLAX) 5 MG EC tablet Take 2 tablets (10 mg total) by mouth daily as needed for mild constipation or moderate constipation. 09/08/16  Yes Houston Siren, MD  calcium carbonate (OS-CAL) 600 MG TABS tablet Take 600 mg by mouth every other day.    Yes [provider]  carvedilol (COREG) 25 MG tablet Take 25  mg by mouth every morning. 1 bid 09/10/16  Yes Pecola Lawless, MD  Cholecalciferol (VITAMIN D) 2000 units CAPS Take 1 capsule by mouth every morning.   Yes [provider]  Fe Fum-FA-B Cmp-C-Zn-Mg-Mn-Cu (HEMOCYTE PLUS) 106-1 MG CAPS Take 1 capsule by mouth daily. 11/14/16  Yes Martin, Mary-Margaret, FNP  guaiFENesin (MUCINEX) 600 MG 12 hr tablet Take 600 mg by mouth 2 (two) times daily. 7 day course   Yes [provider]  isosorbide mononitrate (IMDUR) 30 MG 24 hr tablet Take 1 tablet (30 mg total) by mouth daily. 08/09/16  Yes Noralee Stain Chahn-Yang, DO  levothyroxine (SYNTHROID, LEVOTHROID) 88 MCG tablet Take 1 tablet (88 mcg total) by mouth daily before breakfast. 04/23/16  Yes Daphine Deutscher, Mary-Margaret, FNP  Multiple Vitamin (MULTIVITAMIN WITH MINERALS) TABS tablet Take 1 tablet by mouth daily.   Yes [provider]  Omega-3 Fatty Acids (FISH OIL) 1000 MG CAPS Take 1 capsule by mouth every morning.    Yes [provider]  polyethylene glycol (MIRALAX / GLYCOLAX) packet Take 17 g by mouth daily as needed for mild constipation or moderate constipation.    Yes [provider]  sodium chloride 1 g tablet Take 1 g by mouth every morning.   Yes [provider]  vitamin C (ASCORBIC ACID) 500 MG tablet Take 500 mg by mouth 2 (two) times daily.   Yes [provider]  zinc sulfate 220 (50 Zn) MG capsule Take 220 mg by mouth every morning.   Yes [provider]  vitamin B-12 (CYANOCOBALAMIN) 1000 MCG tablet Take 1 tablet (1,000 mcg total) by mouth daily. Patient not taking: Reported on 12/05/2016 11/12/16   Ralene Cork, MD    Family History Family History  Problem Relation Age of Onset  . Heart disease Mother   . Heart attack Mother        massive  . COPD Father   . Emphysema Father   . Down syndrome Brother     Social History Social History  Substance Use Topics  . Smoking status: Former Smoker    Packs/day: 1.00    Years:  55.00    Types: Cigarettes    Quit date: 08/05/2016  . Smokeless tobacco: Never Used  . Alcohol use No     Allergies   Patient has no known allergies.   Review of Systems Review of Systems  All other systems reviewed and are negative.    Physical Exam Updated Vital Signs BP 132/89 (BP Location: Right Arm)   Pulse 62   Temp 97.8 F (36.6 C) (Oral)   Resp 19   Ht 1.575 m (5\' 2" )   Wt 52.6 kg (116 lb)   SpO2 95%   BMI 21.22 kg/m   Physical Exam  Constitutional: No distress.  Elderly, frail  HENT:  Head: Normocephalic and atraumatic.  Right Ear: External ear normal.  Left Ear: External ear normal.  Eyes:  Conjunctivae are normal. Right eye exhibits no discharge. Left eye exhibits no discharge. No scleral icterus.  Neck: Neck supple. No tracheal deviation present.  Cardiovascular: Normal rate, regular rhythm and intact distal pulses.   Pulmonary/Chest: Effort normal. No stridor. No respiratory distress. She has no wheezes. She has no rales.  Few rhonchi, wet sounding cough  Abdominal: Soft. Bowel sounds are normal. She exhibits no distension and no mass. There is no tenderness. There is no rebound and no guarding.  Musculoskeletal: She exhibits edema ( Pitting edema bilateral lower extremities). She exhibits no tenderness.  Neurological: She is alert. She has normal strength. No cranial nerve deficit (no facial droop, extraocular movements intact, no slurred speech) or sensory deficit. She exhibits normal muscle tone. She displays no seizure activity. Coordination normal.  Skin: Skin is warm and dry. No rash noted.  Psychiatric: She has a normal mood and affect.  Nursing note and vitals reviewed.    ED Treatments / Results  Labs (all labs ordered are listed, but only abnormal results are displayed) Labs Reviewed  COMPREHENSIVE METABOLIC PANEL - Abnormal; Notable for the following:       Result Value   Sodium 129 (*)    Chloride 98 (*)    CO2 18 (*)    Glucose, Bld  522 (*)    BUN 99 (*)    Creatinine, Ser 1.92 (*)    Calcium 8.4 (*)    Total Protein 5.3 (*)    Albumin 2.2 (*)    Alkaline Phosphatase 166 (*)    Total Bilirubin 2.7 (*)    GFR calc non Af Amer 24 (*)    GFR calc Af Amer 27 (*)    All other components within normal limits  URINALYSIS, ROUTINE W REFLEX MICROSCOPIC - Abnormal; Notable for the following:    Color, Urine AMBER (*)    APPearance HAZY (*)    Glucose, UA 150 (*)    Protein, ur 30 (*)    Squamous Epithelial / LPF 0-5 (*)    All other components within normal limits  CBC WITH DIFFERENTIAL/PLATELET - Abnormal; Notable for the following:    RBC 3.29 (*)    Hemoglobin 10.8 (*)    HCT 31.4 (*)    RDW 17.1 (*)    Platelets 104 (*)    Lymphs Abs 0.4 (*)    All other components within normal limits  BRAIN NATRIURETIC PEPTIDE - Abnormal; Notable for the following:    B Natriuretic Peptide >4,500.0 (*)    All other components within normal limits  I-STAT TROPONIN, ED - Abnormal; Notable for the following:    Troponin i, poc 0.14 (*)    All other components within normal limits  LIPASE, BLOOD    EKG  EKG Interpretation  Date/Time:  Tuesday December 04 2016 18:01:09 EDT Ventricular Rate:  64 PR Interval:    QRS Duration: 129 QT Interval:  433 QTC Calculation: 447 R Axis:   95 Text Interpretation:  Sinus rhythm RBBB and LPFB Repol abnrm suggests ischemia, lateral leads Baseline wander in lead(s) I III aVL No significant change since last tracing Confirmed by Linwood DibblesKnapp, Whyatt Klinger [phone]([id number]) on 2016-07-04 6:14:47 PM       Radiology Dg Chest 2 View  Result Date: 2016-07-04 CLINICAL DATA:  Cough and congestion EXAM: CHEST  2 VIEW COMPARISON:  09/04/2016 FINDINGS: Prominent interstitial markings diffusely and bilaterally. This was not present on 11/23/2014 and is most compatible with acute disease. There is vascular congestion this is  most likely interstitial edema. Pneumonia not excluded. No significant effusion. Underlying COPD.  IMPRESSION: Prominent interstitial markings bilaterally most consistent with congestive heart failure and interstitial edema. Electronically Signed   By: Marlan Palau M.D.   On: 11/14/2016 18:43    Procedures .Critical Care Performed by: Linwood Dibbles Authorized by: Linwood Dibbles   Critical care provider statement:    Critical care time (minutes):  45   Critical care was time spent personally by me on the following activities:  Discussions with consultants, evaluation of patient's response to treatment, examination of patient, ordering and performing treatments and interventions, ordering and review of laboratory studies, ordering and review of radiographic studies, pulse oximetry, re-evaluation of patient's condition, obtaining history from patient or surrogate and review of old charts   (including critical care time)  Medications Ordered in ED Medications  furosemide (LASIX) injection 40 mg (not administered)  insulin regular (NOVOLIN R,HUMULIN R) 100 Units in sodium chloride 0.9 % 100 mL (1 Units/mL) infusion (not administered)     Initial Impression / Assessment and Plan / ED Course  I have reviewed the triage vital signs and the nursing notes.  Pertinent labs & imaging results that were available during my care of the patient were reviewed by me and considered in my medical decision making (see chart for details).  Clinical Course as of Dec 05 2019  Tue Dec 04, 2016  1959 Laboratory tests have been reviewed.  Patient has multiple electrolyte abnormalities including elevated glucose.  Bicarb is low however this appears to be chronic.  I doubt acute DKA.  I will start her on insulin however.  Patient was sent for possible cholecystitis.  She is not having any abdominal pain and her AST and ALT are normal.  I doubt acute cholecystitis at this time.  Her x-ray 1 week ago also suggest the possibility of pneumonia however her laboratory tests today are more consistent with a congestive heart  failure exacerbation.  [JK]    Clinical Course User Index [JK] Linwood Dibbles, MD   Patient presented to the emergency room for evaluation of possible pneumonia and cholecystitis.  Patient's laboratory tests are more consistent with acute congestive heart failure.  She has an elevated BNP and slightly elevated troponin.  She also has peripheral edema on exam.  I have ordered IV Lasix.  I doubt that the patient is having acute cholecystitis.  She is not having any abdominal pain.  Her LFTs are normal.  The ultrasound findings were from over a week ago.  Patient also is having hyperglycemia.  She has chronic renal insufficiency and decreased bicarb however this is a chronic issue noted on previous labs so I doubt that she is having acute diabetic ketoacidosis.  I have ordered IV insulin.  I will consult the medical service for admission  Final Clinical Impressions(s) / ED Diagnoses   Final diagnoses:  Acute congestive heart failure, unspecified heart failure type [place])     Linwood Dibbles, MD 12/03/2016 2021

## 2016-12-04 NOTE — H&P (Signed)
History and Physical    Michele Walters:[id number] DOB: 26-May-1935 DOA: 11/17/2016  PCP: Michele Pierini, FNP   Patient coming from: Michele Walters  I have personally briefly reviewed patient's old medical records in [facility]  Chief Complaint: Abnormal x-ray and ultrasound  HPI: Michele Walters is a 81 y.o. female with medical history significant of anxiety, allergy, asthma, cataracts, type 2 diabetes, dysphagia, history of falls, muscle weakness, hyperlipidemia, hypertension, chronic kidney disease, hypothyroidism who is brought to the emergency department due to an abnormal chest x-ray showing possible pneumonia and ultrasound showing positive cholecystitis last week. The chest x-ray showed diffuse right-sided infiltrates. Her ultrasound showed gallbladder wall thickening without cholelithiasis. This was concerning for cholecystitis and a HIDA scan was suggested.   While seen, the patient stated that she has had on and off abdominal pain. She has also been having dyspnea on exertion, occasionally productive cough and significant lower extremity edema. She denies fever, chills, chest pain, palpitations, nausea, emesis, diarrhea, melena or hematochezia. She has frequent constipation. No dysuria, gross hematuria, or urgency or oliguria. Incidentally her blood glucose was noticed to be 522 mg/dL and the EGD. Denies polyuria, polydipsia or blurred vision.  ED Course: Vital signs temperature 97.8, pulse 72, respirations 16, blood pressure 122/70 700% oxygen on room air. She received furosemide 40 mg IVP 1 and was started on a glucose stabilizer/insulin infusion while in the emergency department.  Her workup shows a urinalysis WBC is 8.2, hemoglobin 10.8 and platelets 104. Her EKG was sinus rhythm with RBBB and LPFB, repolarization abnormality suggests ischemia, lateral leads. Baseline wander in lead(s) I III aVL. Troponin level was 0.14 pg/mm and her BNP was over 4500 pg/mL. Lipase was  34. Her CMP shows a sodium of 129, which is normal when corrected is 139 by Hillier's formula, potassium 5.0, chloride 98 and bicarbonate 18 mmol/L. Her BUN was 99, creatinine 1.92 and glucose 522 mg/dL. Her alkaline phosphatase was 566 and total bilirubin was 2.7 mg/dL.  Review of Systems: As per HPI otherwise 10 point review of systems negative.    Past Medical History:  Diagnosis Date  . Allergy   . Anemia    iron def  . Cataract   . Chronic combined systolic (congestive) and diastolic (congestive) heart failure (HCC)   . Diabetes mellitus without complication (HCC)   . Dysphagia   . Frequent falls   . Hyperlipidemia   . Hypertension   . Muscle weakness   . Renal disorder    chroninc kidney disease  . Thyroid disease     Past Surgical History:  Procedure Laterality Date  . ABDOMINAL HYSTERECTOMY     partial but later ovaries removed  . EYE SURGERY Bilateral    cataract  . OOPHORECTOMY Bilateral      reports that she quit smoking about 3 months ago. Her smoking use included Cigarettes. She has a 55.00 pack-year smoking history. She has never used smokeless tobacco. She reports that she does not drink alcohol or use drugs.  No Known Allergies  Family History  Problem Relation Age of Onset  . Heart disease Mother   . Heart attack Mother        massive  . COPD Father   . Emphysema Father   . Down syndrome Brother     Prior to Admission medications   Medication Sig Start Date End Date Taking? Authorizing Provider  Amino Acids-Protein Hydrolys (FEEDING SUPPLEMENT, PRO-STAT 64,) LIQD Take 30 mLs by mouth 2 (  two) times daily.   Yes [provider]  apixaban (ELIQUIS) 2.5 MG TABS tablet Take 2.5 mg by mouth 2 (two) times daily.   Yes [provider]  bisacodyl (DULCOLAX) 5 MG EC tablet Take 2 tablets (10 mg total) by mouth daily as needed for mild constipation or moderate constipation. 09/08/16  Yes Houston Siren, MD  calcium carbonate (OS-CAL) 600 MG TABS  tablet Take 600 mg by mouth every other day.    Yes [provider]  carvedilol (COREG) 25 MG tablet Take 25 mg by mouth every morning. 1 bid 09/10/16  Yes Pecola Lawless, MD  Cholecalciferol (VITAMIN D) 2000 units CAPS Take 1 capsule by mouth every morning.   Yes [provider]  Fe Fum-FA-B Cmp-C-Zn-Mg-Mn-Cu (HEMOCYTE PLUS) 106-1 MG CAPS Take 1 capsule by mouth daily. 11/14/16  Yes Martin, Mary-Margaret, FNP  guaiFENesin (MUCINEX) 600 MG 12 hr tablet Take 600 mg by mouth 2 (two) times daily. 7 day course   Yes [provider]  isosorbide mononitrate (IMDUR) 30 MG 24 hr tablet Take 1 tablet (30 mg total) by mouth daily. 08/09/16  Yes Noralee Stain Chahn-Yang, DO  levothyroxine (SYNTHROID, LEVOTHROID) 88 MCG tablet Take 1 tablet (88 mcg total) by mouth daily before breakfast. 04/23/16  Yes Daphine Deutscher, Mary-Margaret, FNP  Multiple Vitamin (MULTIVITAMIN WITH MINERALS) TABS tablet Take 1 tablet by mouth daily.   Yes [provider]  Omega-3 Fatty Acids (FISH OIL) 1000 MG CAPS Take 1 capsule by mouth every morning.    Yes [provider]  polyethylene glycol (MIRALAX / GLYCOLAX) packet Take 17 g by mouth daily as needed for mild constipation or moderate constipation.    Yes [provider]  sodium chloride 1 g tablet Take 1 g by mouth every morning.   Yes [provider]  vitamin C (ASCORBIC ACID) 500 MG tablet Take 500 mg by mouth 2 (two) times daily.   Yes [provider]  zinc sulfate 220 (50 Zn) MG capsule Take 220 mg by mouth every morning.   Yes [provider]  vitamin B-12 (CYANOCOBALAMIN) 1000 MCG tablet Take 1 tablet (1,000 mcg total) by mouth daily. Patient not taking: Reported on 11/20/2016 11/12/16   Ralene Cork, MD    Physical Exam: Vitals:   11/18/2016 1704 11/20/2016 1830 12/03/2016 1908 11/24/2016 2100  BP: 122/77 104/80 132/89 (!) 129/99  Pulse: 72 62 62 64  Resp: 16 19 19 16   Temp: 97.8 F (36.6 C)       TempSrc: Oral     SpO2: 100% 97% 95% 98%  Weight:      Height:        Constitutional: Looks chronically ill. Mildly anxious. Upset about being in the hospital. Eyes: PERRL, lids and conjunctivae normal ENMT: Severely impaired hearing. Mucous membranes are moist. Posterior pharynx clear of any exudate or lesions. Neck: normal, supple, no masses, no thyromegaly Respiratory: Decreased breath sounds on bases with basilar crackles, rhonchi and mild wheezing bilateral. Normal respiratory effort. No accessory muscle use.  Cardiovascular: Regular rate and rhythm, no murmurs / rubs / gallops. 3+ lower extremity pitting edema. 2+ pedal pulses. No carotid bruits.  Abdomen: Soft, no tenderness, no masses palpated. No hepatosplenomegaly. Bowel sounds positive.  Musculoskeletal: no clubbing / cyanosis.  Good ROM, no contractures. Normal muscle tone.  Skin: Positive pressure ulcer. Neurologic: CN 2-12 grossly intact. Sensation intact, DTR normal. Generalized weakness, but grossly nonfocal Psychiatric: Alert and oriented x 4. Mildly anxious mood.  Labs on Admission: I have personally reviewed following labs and imaging studies  CBC:  Recent Labs Lab 11/21/2016 1807  WBC 8.2  NEUTROABS 7.6  HGB 10.8*  HCT 31.4*  MCV 95.4  PLT 104*   Basic Metabolic Panel:  Recent Labs Lab 12/03/2016 1807  NA 129*  K 5.0  CL 98*  CO2 18*  GLUCOSE 522*  BUN 99*  CREATININE 1.92*  CALCIUM 8.4*   GFR: Estimated Creatinine Clearance: 18.8 mL/min (A) (by C-G formula based on SCr of 1.92 mg/dL (H)). Liver Function Tests:  Recent Labs Lab 11/16/2016 1807  AST 24  ALT 20  ALKPHOS 166*  BILITOT 2.7*  PROT 5.3*  ALBUMIN 2.2*    Recent Labs Lab 12/03/2016 1807  LIPASE 34   No results for input(s): AMMONIA in the last 168 hours. Coagulation Profile: No results for input(s): INR, PROTIME in the last 168 hours. Cardiac Enzymes: No results for input(s): CKTOTAL, CKMB, CKMBINDEX, TROPONINI in the  last 168 hours. BNP (last 3 results) No results for input(s): PROBNP in the last 8760 hours. HbA1C: No results for input(s): HGBA1C in the last 72 hours. CBG:  Recent Labs Lab 11/30/2016 2058  GLUCAP 447*   Lipid Profile: No results for input(s): CHOL, HDL, LDLCALC, TRIG, CHOLHDL, LDLDIRECT in the last 72 hours. Thyroid Function Tests: No results for input(s): TSH, T4TOTAL, FREET4, T3FREE, THYROIDAB in the last 72 hours. Anemia Panel: No results for input(s): VITAMINB12, FOLATE, FERRITIN, TIBC, IRON, RETICCTPCT in the last 72 hours. Urine analysis:    Component Value Date/Time   COLORURINE AMBER (A) 11/06/2016 1937   APPEARANCEUR HAZY (A) 12/03/2016 1937   LABSPEC 1.019 11/25/2016 1937   PHURINE 5.0 11/13/2016 1937   GLUCOSEU 150 (A) 12/05/2016 1937   HGBUR NEGATIVE 12/02/2016 1937   BILIRUBINUR NEGATIVE 12/05/2016 1937   KETONESUR NEGATIVE 11/13/2016 1937   PROTEINUR 30 (A) 11/08/2016 1937   NITRITE NEGATIVE 11/14/2016 1937   LEUKOCYTESUR NEGATIVE 12/03/2016 1937    Radiological Exams on Admission: Dg Chest 2 View  Result Date: 11/17/2016 CLINICAL DATA:  Cough and congestion EXAM: CHEST  2 VIEW COMPARISON:  09/04/2016 FINDINGS: Prominent interstitial markings diffusely and bilaterally. This was not present on 11/23/2014 and is most compatible with acute disease. There is vascular congestion this is most likely interstitial edema. Pneumonia not excluded. No significant effusion. Underlying COPD. IMPRESSION: Prominent interstitial markings bilaterally most consistent with congestive heart failure and interstitial edema. Electronically Signed   By: Marlan Palauharles  Clark M.D.   On: 11/25/2016 18:43   08/05/2016 Echocardiogram  ------------------------------------------------------------------- LV EF: 35% -   40%  ------------------------------------------------------------------- Indications:      CHF -  428.0.  ------------------------------------------------------------------- History:   PMH:  Chronic Kidney Disease.  Dyspnea.  Risk factors: Current tobacco use. Hypertension. Diabetes mellitus. Dyslipidemia.   ------------------------------------------------------------------- Study Conclusions  - Left ventricle: The cavity size was normal. Wall thickness was   increased in a pattern of moderate to severe LVH. There was focal   basal hypertrophy. Systolic function was moderately reduced. The   estimated ejection fraction was in the range of 35% to 40%.   Doppler parameters are consistent with abnormal left ventricular   relaxation (grade 1 diastolic dysfunction). - Aortic valve: There was very mild stenosis. Valve area (VTI):   1.29 cm^2. Valve area (Vmax): 1.3 cm^2. Valve area (Vmean): 1.35   cm^2. - Mitral valve: There was mild regurgitation. - Left atrium: The atrium was moderately dilated. - Tricuspid valve: There was mild-moderate regurgitation. -  Pulmonary arteries: Systolic pressure was moderately increased.   PA peak pressure: 44 mm Hg (S). --------------------------------------------------------------------------------------------------------------------------------- 08/06/2016 NM Myocar Multi W/Spect W/Wall Motion / EF   No T wave inversion was noted during stress.  Defect 1: There is a large defect of moderate severity present in the basal inferoseptal, mid anteroseptal, mid inferoseptal, mid inferior, apical septal and apical inferior location.  Nuclear stress EF: 39%.  Findings consistent with ischemia.  This is an intermediate risk study.  Mild RV uptake noted, suggesting elevated pulmonary pressure   Large size, moderate intensity partially reversible anteroseptal, septal and inferoseptal perfusion defect. Findings suggestive of ischemia or less likely partially reversible LBBB-related defect. LVEF 39% with global hypokinesis and incoordinate septal motion.  Mild RV tracer uptake noted. This is an intermediate risk study.   EKG: Independently reviewed. Vent. rate 64 BPM PR interval * ms QRS duration 129 ms QT/QTc 433/447 ms P-R-T axes 64 95 218 Sinus rhythm RBBB and LPFB Repol abnrm suggests ischemia, lateral leads Baseline wander in lead(s) I III aVL No significant change since last tracing  Assessment/Plan Principal Problem:   Hyperosmolar non-ketotic state in patient with type 2 diabetes mellitus (HCC) Admit to stepdown/inpatient. Continue insulin infusion. No IV fluids due to combined congestive heart failure Monitor CBG hourly. Monitor electrolytes and replace as needed. Carbohydrate modified diet.  Active Problems:   Acute on chronic combined systolic and diastolic CHF (congestive heart failure) (HCC) Fluid restriction. Continue furosemide 40 mg IVP twice a day. Monitor renal function and electrolytes. Start lisinopril 2.5 mg by mouth daily. Resume carvedilol in the morning once CHF is improved.    Abdominal pain.   Elevated bilirubin   Abnormal [place] (ultrasound) of abdomen. Keep nothing by mouth tomorrow after midnight. Schedule for Thursday once her blood glucose is controlled/CHF better. Follow-up LFTs. Treat and prevent constipation.    Hypothyroidism Continue levothyroxine 88 g by mouth daily. Check TSH as needed.    Hyperlipidemia Just taking fish oils at this time. Continue heart healthy/carb modified diet.    Normocytic anemia 3 weeks ago had normal iron studies, except for increased ferritin. Monitor hematocrit and hemoglobin.    Elevated troponin Has had previous elevation. Trend troponin level. Continue cardiac telemetry.    Paroxysmal atrial fibrillation (HCC) CHA?DS?-VASc Score of at least 6. Continue apixaban anticoagulation purposes. Resume carvedilol 25 mg by mouth twice a day in the morning once CHF exacerbation improved.    Pressure injury skin Continue local care. Pressure points  padding. Air mattress if available. Consider PT evaluation once she feels better Continue vitamin C and zinc supplementation.      DVT prophylaxis: On Apixaban. Code Status: Full code. Family Communication:  Disposition Plan: Admit for diuresis with IV Lasix and HIDA scan evaluation. Consults called:  Admission status: Inpatient/SDU.   Bobette Mo MD [facility] Pager [phone].  If 7PM-7AM, please contact night-coverage [email or web address] Password [email or web address]  12-08-2016, 11:06 PM

## 2016-12-04 NOTE — ED Notes (Signed)
Date and time results received: 12/05/2016 1946 (use smartphrase ".now" to insert current time)  Test: glucose Critical Value: 522  Name of Provider Notified: Roselyn BeringJ Knapp  Orders Received? Or Actions Taken?: notified

## 2016-12-05 ENCOUNTER — Encounter (HOSPITAL_COMMUNITY): Payer: Self-pay

## 2016-12-05 ENCOUNTER — Inpatient Hospital Stay (HOSPITAL_COMMUNITY): Payer: Medicare Other

## 2016-12-05 DIAGNOSIS — I5043 Acute on chronic combined systolic (congestive) and diastolic (congestive) heart failure: Secondary | ICD-10-CM | POA: Diagnosis present

## 2016-12-05 DIAGNOSIS — R109 Unspecified abdominal pain: Secondary | ICD-10-CM | POA: Diagnosis present

## 2016-12-05 DIAGNOSIS — E039 Hypothyroidism, unspecified: Secondary | ICD-10-CM

## 2016-12-05 DIAGNOSIS — D649 Anemia, unspecified: Secondary | ICD-10-CM

## 2016-12-05 DIAGNOSIS — R748 Abnormal levels of other serum enzymes: Secondary | ICD-10-CM

## 2016-12-05 DIAGNOSIS — R17 Unspecified jaundice: Secondary | ICD-10-CM

## 2016-12-05 DIAGNOSIS — L8990 Pressure ulcer of unspecified site, unspecified stage: Secondary | ICD-10-CM | POA: Diagnosis present

## 2016-12-05 DIAGNOSIS — I480 Paroxysmal atrial fibrillation: Secondary | ICD-10-CM

## 2016-12-05 DIAGNOSIS — I509 Heart failure, unspecified: Secondary | ICD-10-CM

## 2016-12-05 DIAGNOSIS — R935 Abnormal findings on diagnostic imaging of other abdominal regions, including retroperitoneum: Secondary | ICD-10-CM

## 2016-12-05 LAB — GLUCOSE, CAPILLARY
GLUCOSE-CAPILLARY: 300 mg/dL — AB (ref 65–99)
GLUCOSE-CAPILLARY: 273 mg/dL — AB (ref 65–99)
GLUCOSE-CAPILLARY: 214 mg/dL — AB (ref 65–99)
GLUCOSE-CAPILLARY: 104 mg/dL — AB (ref 65–99)
GLUCOSE-CAPILLARY: 74 mg/dL (ref 65–99)
GLUCOSE-CAPILLARY: 83 mg/dL (ref 65–99)
GLUCOSE-CAPILLARY: 148 mg/dL — AB (ref 65–99)
Glucose-Capillary: 140 mg/dL — ABNORMAL HIGH (ref 65–99)
Glucose-Capillary: 167 mg/dL — ABNORMAL HIGH (ref 65–99)
Glucose-Capillary: 185 mg/dL — ABNORMAL HIGH (ref 65–99)
Glucose-Capillary: 344 mg/dL — ABNORMAL HIGH (ref 65–99)
Glucose-Capillary: 379 mg/dL — ABNORMAL HIGH (ref 65–99)
Glucose-Capillary: 70 mg/dL (ref 65–99)

## 2016-12-05 LAB — BLOOD GAS, ARTERIAL
Acid-base deficit: 7.1 mmol/L — ABNORMAL HIGH (ref 0.0–2.0)
Drawn by: 382351
FIO2: 100
PATIENT TEMPERATURE: 37
PH ART: 7.287 — AB (ref 7.350–7.450)
pCO2 arterial: 39.6 mmHg (ref 32.0–48.0)
pO2, Arterial: 249 mmHg — ABNORMAL HIGH (ref 83.0–108.0)

## 2016-12-05 LAB — BASIC METABOLIC PANEL
Anion gap: 11 (ref 5–15)
BUN: 100 mg/dL — AB (ref 6–20)
CALCIUM: 8.2 mg/dL — AB (ref 8.9–10.3)
CHLORIDE: 100 mmol/L — AB (ref 101–111)
CO2: 18 mmol/L — ABNORMAL LOW (ref 22–32)
CREATININE: 1.72 mg/dL — AB (ref 0.44–1.00)
GFR calc non Af Amer: 27 mL/min — ABNORMAL LOW (ref >60)
GFR, EST AFRICAN AMERICAN: 31 mL/min — AB (ref >60)
Glucose, Bld: 119 mg/dL — ABNORMAL HIGH (ref 65–99)
Potassium: 3.7 mmol/L (ref 3.5–5.1)
SODIUM: 129 mmol/L — AB (ref 135–145)

## 2016-12-05 LAB — TROPONIN I
Troponin I: 0.08 ng/mL (ref <0.03)
Troponin I: 0.05 ng/mL (ref <0.03)

## 2016-12-05 LAB — CBC WITH DIFFERENTIAL/PLATELET
BASOS PCT: 0 %
Basophils Absolute: 0 10*3/uL (ref 0.0–0.1)
EOS ABS: 0.1 10*3/uL (ref 0.0–0.7)
EOS PCT: 1 %
HCT: 30.6 % — ABNORMAL LOW (ref 36.0–46.0)
Hemoglobin: 10.7 g/dL — ABNORMAL LOW (ref 12.0–15.0)
LYMPHS ABS: 0.6 10*3/uL — AB (ref 0.7–4.0)
Lymphocytes Relative: 7 %
MCH: 32.6 pg (ref 26.0–34.0)
MCHC: 35 g/dL (ref 30.0–36.0)
MCV: 93.3 fL (ref 78.0–100.0)
Monocytes Absolute: 0.2 10*3/uL (ref 0.1–1.0)
Monocytes Relative: 3 %
NEUTROS PCT: 90 %
Neutro Abs: 7.5 10*3/uL (ref 1.7–7.7)
PLATELETS: 104 10*3/uL — AB (ref 150–400)
RBC: 3.28 MIL/uL — ABNORMAL LOW (ref 3.87–5.11)
RDW: 16.8 % — AB (ref 11.5–15.5)
WBC: 8.4 10*3/uL (ref 4.0–10.5)

## 2016-12-05 LAB — MAGNESIUM: MAGNESIUM: 2.3 mg/dL (ref 1.7–2.4)

## 2016-12-05 LAB — MRSA PCR SCREENING: MRSA by PCR: POSITIVE — AB

## 2016-12-05 MED ORDER — MUPIROCIN 2 % EX OINT
1.0000 "application " | TOPICAL_OINTMENT | Freq: Two times a day (BID) | CUTANEOUS | Status: DC
  Administered 2016-12-05: 1 via NASAL
  Filled 2016-12-05: qty 22

## 2016-12-05 MED ORDER — STERILE WATER FOR INJECTION IJ SOLN
INTRAMUSCULAR | Status: AC
  Filled 2016-12-05: qty 10

## 2016-12-05 MED ORDER — MAGNESIUM SULFATE 2 GM/50ML IV SOLN
2.0000 g | Freq: Once | INTRAVENOUS | Status: AC
  Administered 2016-12-05: 2 g via INTRAVENOUS
  Filled 2016-12-05: qty 50

## 2016-12-05 MED ORDER — SINCALIDE 5 MCG IJ SOLR
INTRAMUSCULAR | Status: AC
Start: 2016-12-05 — End: 2016-12-06
  Filled 2016-12-05: qty 5

## 2016-12-05 MED ORDER — CHLORHEXIDINE GLUCONATE 0.12 % MT SOLN: 15.0000 mL | Freq: Two times a day (BID) | OROMUCOSAL | Status: DC

## 2016-12-05 MED ORDER — CHLORHEXIDINE GLUCONATE CLOTH 2 % EX PADS: 6.0000 | MEDICATED_PAD | Freq: Every day | CUTANEOUS | Status: DC

## 2016-12-05 MED ORDER — INSULIN ASPART 100 UNIT/ML ~~LOC~~ SOLN
0.0000 [IU] | SUBCUTANEOUS | Status: DC
  Administered 2016-12-05: 2 [IU] via SUBCUTANEOUS
  Administered 2016-12-06 (×2): 1 [IU] via SUBCUTANEOUS

## 2016-12-05 MED ORDER — TECHNETIUM TC 99M MEBROFENIN IV KIT
5.0000 | PACK | Freq: Once | INTRAVENOUS | Status: AC | PRN
  Administered 2016-12-05: 5 via INTRAVENOUS

## 2016-12-05 MED ORDER — ORAL CARE MOUTH RINSE: 15.0000 mL | Freq: Two times a day (BID) | OROMUCOSAL | Status: DC

## 2016-12-05 NOTE — Progress Notes (Signed)
Michele Walters admitted with multiple wounds in different stages of healing present on admission. She has been at [facility] recently and per report and per family, she has only been there for a couple of weeks. Also per report, family mentions that before Michele Walters went to the nursing facility ([facility]), she had a stage 1 pressure ulcer on bottom that is now unstageable. Possible social work and case management to be involved.Michele Walters shows some memory impairment and what the doctor seems to think is early stages of dementia.   Ladarious Kresse Shelia MediaK Connelly Spruell, RN 5:06 AM

## 2016-12-05 NOTE — Progress Notes (Signed)
PROGRESS NOTE    Michele AcreLaura B Rhue  ZOX:[id number] DOB: 01/28/1936 DOA: 11/27/2016 PCP: Bennie PieriniMartin, Mary-Margaret, FNP    Brief Narrative:   Michele Walters is a 81 y.o. female with medical history significant of anxiety, allergy, asthma, cataracts, type 2 diabetes, dysphagia, history of falls, muscle weakness, hyperlipidemia, hypertension, chronic kidney disease, hypothyroidism who is brought to the emergency department due to an abnormal chest x-ray showing possible pneumonia and ultrasound showing positive cholecystitis last week. The chest x-ray showed diffuse right-sided infiltrates. Her ultrasound showed gallbladder wall thickening without cholelithiasis. This was concerning for cholecystitis and a HIDA scan was suggested.   While seen, the patient stated that she has had on and off abdominal pain. She has also been having dyspnea on exertion, occasionally productive cough and significant lower extremity edema. She denies fever, chills, chest pain, palpitations, nausea, emesis, diarrhea, melena or hematochezia. She has frequent constipation. No dysuria, gross hematuria, or urgency or oliguria. Incidentally her blood glucose was noticed to be 522 mg/dL and the EGD. Denies polyuria, polydipsia or blurred vision.  ED Course: Vital signs temperature 97.8, pulse 72, respirations 16, blood pressure 122/70 700% oxygen on room air. She received furosemide 40 mg IVP 1 and was started on a glucose stabilizer/insulin infusion while in the emergency department.  Her workup shows a urinalysis WBC is 8.2, hemoglobin 10.8 and platelets 104. Her EKG was sinus rhythm with RBBB and LPFB, repolarization abnormality suggests ischemia, lateral leads. Baseline wander in lead(s) I III aVL. Troponin level was 0.14 pg/mm and her BNP was over 4500 pg/mL. Lipase was 34. Her CMP shows a sodium of 129, which is normal when corrected is 139 by Hillier's formula, potassium 5.0, chloride 98 and bicarbonate 18 mmol/L. Her BUN was  99, creatinine 1.92 and glucose 522 mg/dL. Her alkaline phosphatase was 566 and total bilirubin was 2.7 mg/dL.   Assessment & Plan:   Principal Problem:   Hyperosmolar non-ketotic state in patient with type 2 diabetes mellitus (HCC) Active Problems:   Hypothyroidism   Hyperlipidemia   Normocytic anemia   Elevated troponin   Paroxysmal atrial fibrillation (HCC)   Elevated bilirubin   Pressure injury of skin   Acute on chronic combined systolic and diastolic CHF (congestive heart failure) (HCC)   Abdominal pain   Abnormal US (ultrasound) of abdomen    Hyperosmolar non-ketotic state in patient with type 2 diabetes mellitus (HCC) Transitioned off IV insulin this am Repeat BMP in am Monitor electrolytes and replace as needed. Carbohydrate modified diet.     Acute on chronic combined systolic and diastolic CHF (congestive heart failure) (HCC) Fluid restriction. Continue furosemide 40 mg IVP twice a day. Cr slightly improved this am Start lisinopril 2.5 mg by mouth daily. Hold BB 2/2 bradycardia    Abdominal pain. Elevated bilirubin Abnormal US (ultrasound) of abdomen. HIDA scan performed today showing patent duct Follow-up LFTs. Treat and prevent constipation Discussed with Gen Surg who state patient needs her CHF treated and to be improved prior to be a candidate for surgery     Hypothyroidism Continue levothyroxine 88 g by mouth daily.    Hyperlipidemia Just taking fish oils at this time. Continue heart healthy/carb modified diet.    Normocytic anemia 3 weeks ago had normal iron studies, except for increased ferritin. H/H stable    Elevated troponin Has had previous elevation. Troponin downtrending- elevation likely 2/2 to CHF exacerbation    Paroxysmal atrial fibrillation (HCC) CHA?DS?-VASc Score of at least 6. Continue apixaban anticoagulation purposes.  Will restart carvedilol when improved- patient bradycardic this afternoon    Pressure injury  skin Continue local care. Pressure points padding. Air mattress if available. Consider PT evaluation once she feels better Continue vitamin C and zinc supplementation.    DVT prophylaxis: apixaban Code Status: full code Family Communication: no family bedside Disposition Plan: back to [facility] when improved   Consultants:   Discussed with general surgery  Procedures:   HIDA scan  Antimicrobials:   none    Subjective: Patient asleep.  Minimally arouses to name.  Did receive ativan. Still requiring supplemental oxygen.  Objective: Vitals:   12/05/16 0400 12/05/16 0500 12/05/16 0600 12/05/16 0700  BP: 107/69 114/63 112/74 115/65  Pulse: (!) 52 (!) 45 (!) 46 (!) 47  Resp: 19 20 17  (!) 21  Temp:      TempSrc:      SpO2: 92% 92% 94% 93%  Weight:      Height:        Intake/Output Summary (Last 24 hours) at 12/05/16 0812 Last data filed at 12/05/16 1610  Gross per 24 hour  Intake                0 ml  Output                0 ml  Net                0 ml   Filed Weights   01-Jan-2017 1652 12/05/16 0128  Weight: 52.6 kg (116 lb) 58 kg (127 lb 13.9 oz)    Examination:  General exam: Appears calm and comfortable  Respiratory system: bibasilar rales, upper airway noises appreciated.  Few expiratory wheezes Cardiovascular system: S1 & S2 heard, RRR. No JVD, murmurs, rubs, gallops or clicks. 3+ bilateral pitting edema mid thigh distally Gastrointestinal system: Abdomen is nondistended, soft and nontender. No organomegaly or masses felt. Normal bowel sounds heard. Central nervous system: Alert and oriented. No focal neurological deficits. Extremities: Symmetric 5 x 5 power. Skin: No rashes, lesions or ulcers Psychiatry: Judgement and insight appear normal. Mood & affect appropriate.     Data Reviewed: I have personally reviewed following labs and imaging studies  CBC:  Recent Labs Lab 01-Jan-2017 1807 12/05/16 0523  WBC 8.2 8.4  NEUTROABS 7.6 7.5  HGB  10.8* 10.7*  HCT 31.4* 30.6*  MCV 95.4 93.3  PLT 104* 104*   Basic Metabolic Panel:  Recent Labs Lab 2017/01/01 1807 01/01/2017 2301 12/05/16 0523  NA 129* 127* 129*  K 5.0 4.2 3.7  CL 98* 98* 100*  CO2 18* 16* 18*  GLUCOSE 522* 410* 119*  BUN 99* 98* 100*  CREATININE 1.92* 1.86* 1.72*  CALCIUM 8.4* 8.1* 8.2*  MG  --  1.8 2.3   GFR: Estimated Creatinine Clearance: 21 mL/min (A) (by C-G formula based on SCr of 1.72 mg/dL (H)). Liver Function Tests:  Recent Labs Lab 01-01-17 1807  AST 24  ALT 20  ALKPHOS 166*  BILITOT 2.7*  PROT 5.3*  ALBUMIN 2.2*    Recent Labs Lab 2017-01-01 1807  LIPASE 34   No results for input(s): AMMONIA in the last 168 hours. Coagulation Profile: No results for input(s): INR, PROTIME in the last 168 hours. Cardiac Enzymes:  Recent Labs Lab Jan 01, 2017 2301 12/05/16 0523  TROPONINI 0.10* 0.08*   BNP (last 3 results) No results for input(s): PROBNP in the last 8760 hours. HbA1C: No results for input(s): HGBA1C in the last 72 hours. CBG:  Recent  Labs Lab 12/05/16 0400 12/05/16 0456 12/05/16 0611 12/05/16 0702 12/05/16 0759  GLUCAP 167* 140* 104* 74 70   Lipid Profile: No results for input(s): CHOL, HDL, LDLCALC, TRIG, CHOLHDL, LDLDIRECT in the last 72 hours. Thyroid Function Tests: No results for input(s): TSH, T4TOTAL, FREET4, T3FREE, THYROIDAB in the last 72 hours. Anemia Panel: No results for input(s): VITAMINB12, FOLATE, FERRITIN, TIBC, IRON, RETICCTPCT in the last 72 hours. Sepsis Labs: No results for input(s): PROCALCITON, LATICACIDVEN in the last 168 hours.  Recent Results (from the past 240 hour(s))  MRSA PCR Screening     Status: Abnormal   Collection Time: 12-28-2016 10:18 PM  Result Value Ref Range Status   MRSA by PCR POSITIVE (A) NEGATIVE Final    Comment: RESULT CALLED TO, READ BACK BY AND VERIFIED WITH: SMITH,F @0159  BY MATTHEWS B 10.31.18        The GeneXpert MRSA Assay (FDA approved for NASAL  specimens only), is one component of a comprehensive MRSA colonization surveillance program. It is not intended to diagnose MRSA infection nor to guide or monitor treatment for MRSA infections.          Radiology Studies: Dg Chest 2 View  Result Date: 2016-12-28 CLINICAL DATA:  Cough and congestion EXAM: CHEST  2 VIEW COMPARISON:  09/04/2016 FINDINGS: Prominent interstitial markings diffusely and bilaterally. This was not present on 11/23/2014 and is most compatible with acute disease. There is vascular congestion this is most likely interstitial edema. Pneumonia not excluded. No significant effusion. Underlying COPD. IMPRESSION: Prominent interstitial markings bilaterally most consistent with congestive heart failure and interstitial edema. Electronically Signed   By: Marlan Palau M.D.   On: 12/28/2016 18:43        Scheduled Meds: . apixaban  2.5 mg Oral BID  . calcium carbonate  625 mg Oral QODAY  . carvedilol  25 mg Oral Q breakfast  . Chlorhexidine Gluconate Cloth  6 each Topical Q0600  . cholecalciferol  2,000 Units Oral q morning - 10a  . feeding supplement (PRO-STAT 64)  30 mL Oral BID  . Ferrous Fumarate  1 tablet Oral Daily  . furosemide  40 mg Intravenous Daily  . guaiFENesin  600 mg Oral BID  . insulin aspart  0-9 Units Subcutaneous Q4H  . isosorbide mononitrate  30 mg Oral Daily  . levothyroxine  88 mcg Oral QAC breakfast  . lisinopril  2.5 mg Oral Daily  . mupirocin ointment  1 application Nasal BID  . vitamin C  500 mg Oral BID  . zinc sulfate  220 mg Oral q morning - 10a   Continuous Infusions: . sodium chloride Stopped (12/05/16 0311)  . dextrose 5 % and 0.45% NaCl 10 mL/hr at 12/05/16 0312     LOS: 1 day    Time spent: 30 minutes    Katrinka Blazing, MD [facility] Pager [phone]  If 7PM-7AM, please contact night-coverage [email or web address] Password [email or web address] 12/05/2016, 8:12 AM

## 2016-12-05 NOTE — Progress Notes (Addendum)
Inpatient Diabetes Program Recommendations  AACE/ADA: New Consensus Statement on Inpatient Glycemic Control (2015)  Target Ranges:  Prepandial:   less than 140 mg/dL      Peak postprandial:   less than 180 mg/dL (1-2 hours)      Critically ill patients:  140 - 180 mg/dL  Results for Michele Walters, Michele Walters (MRN [id number]) as of 12/05/2016 06:29  Ref. Range 12/05/2016 00:04 12/05/2016 01:01 12/05/2016 02:08 12/05/2016 03:10 12/05/2016 04:00 12/05/2016 04:56 12/05/2016 06:11  Glucose-Capillary Latest Ref Range: 65 - 99 mg/dL 409344 (H) 811300 (H) 914273 (H) 214 (H) 167 (H) 140 (H) 104 (H)  Results for Michele Walters, Michele Walters (MRN [id number]) as of 12/05/2016 06:29  Ref. Range 10/02/2016 07:14 10/24/2016 13:33 11/12/2016 13:58 11/14/2016 12:01 11/15/2016 14:03 15-May-2016 18:07 15-May-2016 23:01 12/05/2016 05:23  Glucose Latest Ref Range: 65 - 99 mg/dL 086102 (H) 578206 (H) 469298 (H) 257 (H) 281 (H) 522 (HH) 410 (H) 119 (H)   Results for Michele Walters, Michele Walters (MRN [id number]) as of 12/05/2016 06:29  Ref. Range 10/02/2016 07:14  Hemoglobin A1C Latest Ref Range: 4.8 - 5.6 % 5.0   Review of Glycemic Control  Diabetes history: DM2 Outpatient Diabetes medications: None Current orders for Inpatient glycemic control: IV insulin drip  Inpatient Diabetes Program Recommendations:  Basal:  At time of transition off IV insulin, recommend not ordering any basal insulin and just starting with SSI at first and could add basal insulin if needed. Insulin-Correction: At time of transition off IV insulin, please consider ordering CBGs with Novolog 0-9 units Q4H.  NOTE: In reviewing chart, noted patient was an inpatient 09/04/16 to 09/08/16 with fair glycemic control with only Novolog 0-9 units TID and Novolog 0-5 units QHS. Noted office visit note by Dr. Janyth [facility] on 11/12/16 that states "Continue prednisone 40 mg by mouth daily for treatment of hemolytic anemia. Hemoglobin has improved up to 9.5 g/dL.  We will start tapering her  prednisone once her hemoglobin gets up closer to her baseline which is 10.5 g/dL. She also likely has a component of anemia from chronic renal disease. Patient will need close follow-up with her PCP for monitoring of her diabetes while she is on prednisone should cause worsening hyperglycemia.".  Anticipate steroids are cause of hyperglycemia patient presented with to the hospital. Glucose has trended down and most current glucose 104 mg/dl. Patient is not ordered any steroids at this time. If no steroids will be ordered, would recommend not giving basal insulin at time of transition off IV insulin and just use Novolog correction scale Q4H and monitor glucose trends to determine if basal insulin is actually needed or not.  Thanks, Orlando PennerMarie Rochelle Larue, RN, MSN, CDE Diabetes Coordinator Inpatient Diabetes Program [phone] (Team Pager from 8am to 5pm)

## 2016-12-06 ENCOUNTER — Inpatient Hospital Stay (HOSPITAL_COMMUNITY): Payer: Medicare Other

## 2016-12-06 LAB — AMMONIA: AMMONIA: 83 umol/L — AB (ref 9–35)

## 2016-12-06 LAB — COMPREHENSIVE METABOLIC PANEL
ALBUMIN: 1.9 g/dL — AB (ref 3.5–5.0)
ALK PHOS: 128 U/L — AB (ref 38–126)
ALT: 17 U/L (ref 14–54)
ANION GAP: 9 (ref 5–15)
AST: 29 U/L (ref 15–41)
BUN: 104 mg/dL — ABNORMAL HIGH (ref 6–20)
CHLORIDE: 99 mmol/L — AB (ref 101–111)
CO2: 17 mmol/L — AB (ref 22–32)
Calcium: 7.6 mg/dL — ABNORMAL LOW (ref 8.9–10.3)
Creatinine, Ser: 1.94 mg/dL — ABNORMAL HIGH (ref 0.44–1.00)
GFR calc Af Amer: 27 mL/min — ABNORMAL LOW (ref >60)
GFR calc non Af Amer: 23 mL/min — ABNORMAL LOW (ref >60)
GLUCOSE: 146 mg/dL — AB (ref 65–99)
POTASSIUM: 4.5 mmol/L (ref 3.5–5.1)
SODIUM: 125 mmol/L — AB (ref 135–145)
Total Bilirubin: 3.5 mg/dL — ABNORMAL HIGH (ref 0.3–1.2)
Total Protein: 4.8 g/dL — ABNORMAL LOW (ref 6.5–8.1)

## 2016-12-06 LAB — GLUCOSE, CAPILLARY
GLUCOSE-CAPILLARY: 129 mg/dL — AB (ref 65–99)
GLUCOSE-CAPILLARY: 88 mg/dL (ref 65–99)
Glucose-Capillary: 159 mg/dL — ABNORMAL HIGH (ref 65–99)

## 2016-12-06 LAB — LACTIC ACID, PLASMA
LACTIC ACID, VENOUS: 2.5 mmol/L — AB (ref 0.5–1.9)
LACTIC ACID, VENOUS: 2.4 mmol/L — AB (ref 0.5–1.9)

## 2016-12-06 LAB — TROPONIN I: Troponin I: 0.06 ng/mL (ref <0.03)

## 2016-12-06 LAB — PROCALCITONIN: Procalcitonin: 0.57 ng/mL

## 2016-12-06 MED ORDER — NOREPINEPHRINE BITARTRATE 1 MG/ML IV SOLN
0.0000 ug/min | INTRAVENOUS | Status: DC
  Filled 2016-12-06: qty 4

## 2016-12-06 MED ORDER — HALOPERIDOL 0.5 MG PO TABS
0.5000 mg | ORAL_TABLET | ORAL | Status: DC | PRN
  Filled 2016-12-06: qty 1

## 2016-12-06 MED ORDER — PHENYLEPHRINE HCL 10 MG/ML IJ SOLN
INTRAMUSCULAR | Status: AC
  Filled 2016-12-06: qty 1

## 2016-12-06 MED ORDER — SODIUM CHLORIDE 0.9 % IV SOLN
1.0000 mg/h | INTRAVENOUS | Status: DC
  Filled 2016-12-06: qty 10

## 2016-12-06 MED ORDER — MORPHINE BOLUS VIA INFUSION
1.0000 mg | INTRAVENOUS | Status: DC | PRN
  Filled 2016-12-06: qty 1

## 2016-12-06 MED ORDER — HALOPERIDOL LACTATE 2 MG/ML PO CONC: 0.5000 mg | ORAL | Status: DC | PRN

## 2016-12-06 MED ORDER — SALINE SPRAY 0.65 % NA SOLN: 1.0000 | NASAL | Status: DC | PRN

## 2016-12-06 MED ORDER — LORAZEPAM 1 MG PO TABS: 1.0000 mg | ORAL_TABLET | ORAL | Status: DC | PRN

## 2016-12-06 MED ORDER — PHENYLEPHRINE HCL 10 MG/ML IJ SOLN
0.0000 ug/min | INTRAMUSCULAR | Status: DC
  Administered 2016-12-06: 80 ug/min via INTRAVENOUS
  Administered 2016-12-06: 20 ug/min via INTRAVENOUS
  Filled 2016-12-06: qty 1

## 2016-12-06 MED ORDER — LORAZEPAM 2 MG/ML IJ SOLN: 1.0000 mg | INTRAMUSCULAR | Status: DC | PRN

## 2016-12-06 MED ORDER — PIPERACILLIN-TAZOBACTAM 3.375 G IVPB 30 MIN: 3.3750 g | Freq: Once | INTRAVENOUS | Status: DC

## 2016-12-06 MED ORDER — HYDROCORTISONE NA SUCCINATE PF 100 MG IJ SOLR: 50.0000 mg | Freq: Three times a day (TID) | INTRAMUSCULAR | Status: DC

## 2016-12-06 MED ORDER — VANCOMYCIN HCL IN DEXTROSE 750-5 MG/150ML-% IV SOLN: 750.0000 mg | INTRAVENOUS | Status: DC

## 2016-12-06 MED ORDER — HALOPERIDOL LACTATE 5 MG/ML IJ SOLN: 0.5000 mg | INTRAMUSCULAR | Status: DC | PRN

## 2016-12-06 MED ORDER — VANCOMYCIN HCL IN DEXTROSE 1-5 GM/200ML-% IV SOLN: 1000.0000 mg | Freq: Once | INTRAVENOUS | Status: DC

## 2016-12-06 MED ORDER — PIPERACILLIN SOD-TAZOBACTAM SO 2.25 (2-0.25) G IV SOLR: 2.2500 g | Freq: Three times a day (TID) | INTRAVENOUS | Status: DC

## 2016-12-06 DEATH — deceased

## 2016-12-11 ENCOUNTER — Other Ambulatory Visit (HOSPITAL_COMMUNITY): Payer: Medicare Other

## 2016-12-11 ENCOUNTER — Ambulatory Visit (HOSPITAL_COMMUNITY): Payer: Medicare Other

## 2016-12-12 ENCOUNTER — Ambulatory Visit: Payer: Medicare Other | Admitting: Cardiology

## 2016-12-13 ENCOUNTER — Ambulatory Visit (INDEPENDENT_AMBULATORY_CARE_PROVIDER_SITE_OTHER): Payer: Medicare Other | Admitting: Internal Medicine

## 2017-01-05 NOTE — Progress Notes (Signed)
The patient's condition has been deteriorating rapidly overnight. The patients BP on phenylephrine 16730mcg/min was on in the upper 80s systolic and SpO2 was only about 92-94% with Bipap at 70% FiO2. The family was called in and after a discussion with Dr. Rinaldo RatelKadolph, the family decided to remove the Bipap and monitor, place the patient on CMO and start a morphine drip. The patient is also on a warming blanket for comfort as well. Will continue to monitor.

## 2017-01-05 NOTE — Progress Notes (Signed)
Monitor is cuurently reading asystole. Myself and Crystal McGibbony RN listening for heart and lung sounds and checked for pulses and neither were present. Dr. Rinaldo RatelKadolph was informed that 0910 was the called time of death.

## 2017-01-05 NOTE — Progress Notes (Signed)
[facility] and said due to the patient possibly of having sepsis so she is no longer a candidate for any kind of donation

## 2017-01-05 NOTE — Consult Note (Signed)
Consult requested by: [facility] Consult requested for: Respiratory failure  HPI: This is a 81 year old who is a resident of a skilled care facility and who is known to have chronic combined systolic and diastolic heart failure, diabetes, anemia, chronic kidney disease and who came to the emergency department with abnormal chest x-ray showing probable pneumonia as well as having had an ultrasound that showed gallbladder wall thickening last week.  She apparently had a HIDA scan that was normal.  She has been having shortness of breath productive cough edema of both legs.  History is from the medical record she is not able to provide any history now.  She had done fairly well and then over the last 12 hours or so has had significant clinical deterioration to the point that she is required pressors and BiPAP support.  She has not got good blood pressure despite pressors and she is still struggling to breathe despite BiPAP.  Past Medical History:  Diagnosis Date  . Allergy   . Anemia    iron def  . Cataract   . Chronic combined systolic (congestive) and diastolic (congestive) heart failure (HCC)   . Diabetes mellitus without complication (HCC)   . Dysphagia   . Frequent falls   . Hyperlipidemia   . Hypertension   . Muscle weakness   . Renal disorder    chroninc kidney disease  . Thyroid disease      Family History  Problem Relation Age of Onset  . Heart disease Mother   . Heart attack Mother        massive  . COPD Father   . Emphysema Father   . Down syndrome Brother      Social History   Social History  . Marital status: Single    Spouse name: N/A  . Number of children: N/A  . Years of education: N/A   Occupational History  . Retired    Social History Main Topics  . Smoking status: Former Smoker    Packs/day: 1.00    Years: 55.00    Types: Cigarettes    Quit date: 08/05/2016  . Smokeless tobacco: Never Used  . Alcohol use No  . Drug use: No  . Sexual  activity: No   Other Topics Concern  . None   Social History Narrative  . None     ROS: Not obtainable    Objective: Vital signs in last 24 hours: Temp:  [97.1 F (36.2 C)-98.1 F (36.7 C)] 97.1 F (36.2 C) (11/01 0400) Pulse Rate:  [44-66] 45 (11/01 0645) Resp:  [15-24] 19 (11/01 0645) BP: (60-116)/(43-71) 85/54 (11/01 0645) SpO2:  [86 %-99 %] 87 % (11/01 0645) FiO2 (%):  [60 %-70 %] 70 % (11/01 0650) Weight:  [58.3 kg (128 lb 8.5 oz)] 58.3 kg (128 lb 8.5 oz) (11/01 0500) Weight change: 5.683 kg (12 lb 8.5 oz) Last BM Date: 12/05/16  Intake/Output from previous day: 10/31 0701 - 11/01 0700 In: 471.4 [I.V.:471.4] Out: -   PHYSICAL EXAM Constitutional: She does arouse.  Eyes: Pupils react.  Ears nose mouth and throat: Hearing seems to be grossly normal.  I did not examine her throat because of the BiPAP.  Cardiovascular: Her heart is regular with bradycardia.  I do not hear a gallop.  Respiratory: Her lungs show diffuse wheezes and rhonchi.  Skin: She has significant edema of the skin.  Musculoskeletal: She is able to move all 4 extremities.  Neurological: Cannot assess psychiatric: Cannot assess gastrointestinal: Her  abdomen is soft with no masses.  Lab Results: Basic Metabolic Panel:  Recent Labs  40/98/1108-Apr-2018 2301 12/05/16 0523 12/31/2016 0327  NA 127* 129* 125*  K 4.2 3.7 4.5  CL 98* 100* 99*  CO2 16* 18* 17*  GLUCOSE 410* 119* 146*  BUN 98* 100* 104*  CREATININE 1.86* 1.72* 1.94*  CALCIUM 8.1* 8.2* 7.6*  MG 1.8 2.3  --    Liver Function Tests:  Recent Labs  13-May-2016 1807 12/12/2016 0327  AST 24 29  ALT 20 17  ALKPHOS 166* 128*  BILITOT 2.7* 3.5*  PROT 5.3* 4.8*  ALBUMIN 2.2* 1.9*    Recent Labs  13-May-2016 1807  LIPASE 34    Recent Labs  12/27/2016 0327  AMMONIA 83*   CBC:  Recent Labs  13-May-2016 1807 12/05/16 0523  WBC 8.2 8.4  NEUTROABS 7.6 7.5  HGB 10.8* 10.7*  HCT 31.4* 30.6*  MCV 95.4 93.3  PLT 104* 104*   Cardiac  Enzymes:  Recent Labs  12/05/16 0523 12/05/16 1427 12/26/2016 0327  TROPONINI 0.08* 0.05* 0.06*   BNP: No results for input(s): PROBNP in the last 72 hours. D-Dimer: No results for input(s): DDIMER in the last 72 hours. CBG:  Recent Labs  12/05/16 0850 12/05/16 1612 12/05/16 1945 12/10/2016 0047 12/13/2016 0357 12/26/2016 0737  GLUCAP 83 148* 185* 159* 129* 88   Hemoglobin A1C: No results for input(s): HGBA1C in the last 72 hours. Fasting Lipid Panel: No results for input(s): CHOL, HDL, LDLCALC, TRIG, CHOLHDL, LDLDIRECT in the last 72 hours. Thyroid Function Tests: No results for input(s): TSH, T4TOTAL, FREET4, T3FREE, THYROIDAB in the last 72 hours. Anemia Panel: No results for input(s): VITAMINB12, FOLATE, FERRITIN, TIBC, IRON, RETICCTPCT in the last 72 hours. Coagulation: No results for input(s): LABPROT, INR in the last 72 hours. Urine Drug Screen: Drugs of Abuse  No results found for: LABOPIA, COCAINSCRNUR, LABBENZ, AMPHETMU, THCU, LABBARB  Alcohol Level: No results for input(s): ETH in the last 72 hours. Urinalysis:  Recent Labs  13-May-2016 1937  COLORURINE AMBER*  LABSPEC 1.019  PHURINE 5.0  GLUCOSEU 150*  HGBUR NEGATIVE  BILIRUBINUR NEGATIVE  KETONESUR NEGATIVE  PROTEINUR 30*  NITRITE NEGATIVE  LEUKOCYTESUR NEGATIVE   Misc. Labs:   ABGS:  Recent Labs  12/05/16 2254  PHART 7.287*  PO2ART 249*     MICROBIOLOGY: Recent Results (from the past 240 hour(s))  MRSA PCR Screening     Status: Abnormal   Collection Time: 13-May-2016 10:18 PM  Result Value Ref Range Status   MRSA by PCR POSITIVE (A) NEGATIVE Final    Comment: RESULT CALLED TO, READ BACK BY AND VERIFIED WITH: SMITH,F @0159  BY MATTHEWS B 10.31.18        The GeneXpert MRSA Assay (FDA approved for NASAL specimens only), is one component of a comprehensive MRSA colonization surveillance program. It is not intended to diagnose MRSA infection nor to guide or monitor treatment for MRSA  infections.     Studies/Results: Dg Chest 2 View  Result Date: 04-11-16 CLINICAL DATA:  Cough and congestion EXAM: CHEST  2 VIEW COMPARISON:  09/04/2016 FINDINGS: Prominent interstitial markings diffusely and bilaterally. This was not present on 11/23/2014 and is most compatible with acute disease. There is vascular congestion this is most likely interstitial edema. Pneumonia not excluded. No significant effusion. Underlying COPD. IMPRESSION: Prominent interstitial markings bilaterally most consistent with congestive heart failure and interstitial edema. Electronically Signed   By: Marlan Palauharles  Clark M.D.   On: 04-11-16 18:43   Nm  Hepato W/eject Fract  Result Date: 12/05/2016 CLINICAL DATA:  Cholelithiasis with wall thickening ureter EXAM: NUCLEAR MEDICINE HEPATOBILIARY IMAGING WITH GALLBLADDER EF TECHNIQUE: Sequential images of the abdomen were obtained out to 60 minutes following intravenous administration of radiopharmaceutical. After slow intravenous infusion of 1.3 micrograms Cholecystokinin, gallbladder ejection fraction was determined. RADIOPHARMACEUTICALS:  5.2 mCi Tc-76m Choletec IV COMPARISON:  CT 09/09/2016 FINDINGS: Uniform uptake within the liver. Counts slowly clear the liver with the common bile duct. Small bowel is evident by 30 minutes. The gallbladder begins to fill by 40 minutes. After infusion of CCK, there is some in delay contraction but ultimately the gallbladder contracts near completely with a calculated ejection fraction 87.5%. Calculated gallbladder ejection fraction is 87.5%. (At 60 min, normal ejection fraction is greater than 40%.) IMPRESSION: 1. Patent common bile duct and cystic duct. 2. Normal gallbladder ejection fraction. Electronically Signed   By: Genevive Bi M.D.   On: 12/05/2016 13:34   Dg Chest Port 1 View  Result Date: 12/05/2016 CLINICAL DATA:  Shortness of breath and cough EXAM: PORTABLE CHEST 1 VIEW COMPARISON:  Chest radiograph 11/12/2016 FINDINGS:  Unchanged mild cardiomegaly. Diffuse interstitial opacities have worsened from the prior study. No sizable pleural effusion. IMPRESSION: Worsening interstitial pulmonary edema, likely cardiogenic. Electronically Signed   By: Deatra Robinson M.D.   On: 12/05/2016 23:00    Medications:  Prior to Admission:  Prescriptions Prior to Admission  Medication Sig Dispense Refill Last Dose  . Amino Acids-Protein Hydrolys (FEEDING SUPPLEMENT, PRO-STAT 64,) LIQD Take 30 mLs by mouth 2 (two) times daily.   11/30/2016 at Unknown time  . apixaban (ELIQUIS) 2.5 MG TABS tablet Take 2.5 mg by mouth 2 (two) times daily.   11/08/2016 at 1041a  . bisacodyl (DULCOLAX) 5 MG EC tablet Take 2 tablets (10 mg total) by mouth daily as needed for mild constipation or moderate constipation. 30 tablet 0 unknown  . calcium carbonate (OS-CAL) 600 MG TABS tablet Take 600 mg by mouth every other day.    11/27/2016 at Unknown time  . carvedilol (COREG) 25 MG tablet Take 25 mg by mouth every morning. 1 bid 60 tablet 3 11/16/2016 at 1041a  . Cholecalciferol (VITAMIN D) 2000 units CAPS Take 1 capsule by mouth every morning.   11/27/2016 at Unknown time  . Fe Fum-FA-B Cmp-C-Zn-Mg-Mn-Cu (HEMOCYTE PLUS) 106-1 MG CAPS Take 1 capsule by mouth daily. 30 each 5 11/28/2016 at Unknown time  . guaiFENesin (MUCINEX) 600 MG 12 hr tablet Take 600 mg by mouth 2 (two) times daily. 7 day course   11/11/2016 at Unknown time  . isosorbide mononitrate (IMDUR) 30 MG 24 hr tablet Take 1 tablet (30 mg total) by mouth daily. 30 tablet 0 11/07/2016 at Unknown time  . levothyroxine (SYNTHROID, LEVOTHROID) 88 MCG tablet Take 1 tablet (88 mcg total) by mouth daily before breakfast. 90 tablet 2 11/15/2016 at Unknown time  . Multiple Vitamin (MULTIVITAMIN WITH MINERALS) TABS tablet Take 1 tablet by mouth daily.   11/18/2016 at Unknown time  . Omega-3 Fatty Acids (FISH OIL) 1000 MG CAPS Take 1 capsule by mouth every morning.    12/02/2016 at Unknown time  .  polyethylene glycol (MIRALAX / GLYCOLAX) packet Take 17 g by mouth daily as needed for mild constipation or moderate constipation.    unknown  . sodium chloride 1 g tablet Take 1 g by mouth every morning.   11/13/2016 at Unknown time  . vitamin C (ASCORBIC ACID) 500 MG tablet Take 500 mg by  mouth 2 (two) times daily.   11/30/2016 at Unknown time  . zinc sulfate 220 (50 Zn) MG capsule Take 220 mg by mouth every morning.   11/18/2016 at Unknown time  . vitamin B-12 (CYANOCOBALAMIN) 1000 MCG tablet Take 1 tablet (1,000 mcg total) by mouth daily. (Patient not taking: Reported on 11/19/2016) 90 tablet 1 Not Taking at Unknown time   Scheduled: . apixaban  2.5 mg Oral BID  . calcium carbonate  625 mg Oral QODAY  . chlorhexidine  15 mL Mouth Rinse BID  . Chlorhexidine Gluconate Cloth  6 each Topical Q0600  . cholecalciferol  2,000 Units Oral q morning - 10a  . feeding supplement (PRO-STAT 64)  30 mL Oral BID  . Ferrous Fumarate  1 tablet Oral Daily  . furosemide  40 mg Intravenous Daily  . guaiFENesin  600 mg Oral BID  . hydrocortisone sod succinate (SOLU-CORTEF) inj  50 mg Intravenous Q8H  . insulin aspart  0-9 Units Subcutaneous Q4H  . levothyroxine  88 mcg Oral QAC breakfast  . mouth rinse  15 mL Mouth Rinse q12n4p  . mupirocin ointment  1 application Nasal BID  . vitamin C  500 mg Oral BID  . zinc sulfate  220 mg Oral q morning - 10a   Continuous: . morphine    . norepinephrine (LEVOPHED) Adult infusion    . phenylephrine (NEO-SYNEPHRINE) Adult infusion 80 mcg/min (12/07/16 0721)  . piperacillin-tazobactam     Followed by  . piperacillin-tazobactam (ZOSYN)  IV    . vancomycin     Followed by  . [START ON 12/08/2016] vancomycin     ZOX:[id number], haloperidol **OR** haloperidol **OR** haloperidol lactate, ipratropium-albuterol, LORazepam **OR** LORazepam **OR** LORazepam, morphine, polyethylene glycol  Assesment: She was admitted with hyperosmolar nonketotic state in a patient with type  2 diabetes.  She was also admitted with acute on chronic combined systolic and diastolic heart failure.  She currently appears to be septic.  I suspect she probably aspirated.  She is on pressors but that is not holding her blood pressure.  I think she is going to need to be intubated and placed on mechanical ventilation. Principal Problem:   Hyperosmolar non-ketotic state in patient with type 2 diabetes mellitus (HCC) Active Problems:   Hypothyroidism   Hyperlipidemia   Normocytic anemia   Elevated troponin   Paroxysmal atrial fibrillation (HCC)   Elevated bilirubin   Pressure injury of skin   Acute on chronic combined systolic and diastolic CHF (congestive heart failure) (HCC)   Abdominal pain   Abnormal [place] (ultrasound) of abdomen    Plan: After plans were made with hospitalist attending for intubation mechanical ventilation addition of further pressors broad-spectrum antibiotics and steroids the patient's power of attorney arrived and said that she did not want any of those things done and prefers comfort care at this point.  I will of course sign off.    LOS: 2 days   Sonya Gunnoe L 07-Dec-2016, 8:45 AM

## 2017-01-05 NOTE — Discharge Summary (Signed)
Death Summary  Michele Walters:[id number] DOB: 07-23-1935 DOA: 12/11/2016  PCP: Michele Pierini, FNP  Admit date: 2016/12/11 Date of Death: 2016/12/13 Time of Death: 09:10 Notification: Michele Pierini, FNP notified of death of 2016/12/13   History of present illness:  Michele Walters is a 81 y.o. female with medical history significant of anxiety, allergy, asthma, cataracts, type 2 diabetes, dysphagia, history of falls, muscle weakness, hyperlipidemia, hypertension, chronic kidney disease, hypothyroidism who is brought to the emergency department due to an abnormal chest x-ray showing possible pneumonia and ultrasound showing positive cholecystitis last week. The chest x-ray showed diffuse right-sided infiltrates. Her ultrasound showed gallbladder wall thickening without cholelithiasis. This was concerning for cholecystitis and a HIDA scan was suggested.   While seen, the patient stated that she has had on and off abdominal pain. She has also been having dyspnea on exertion, occasionally productive cough and significant lower extremity edema. She denies fever, chills, chest pain, palpitations, nausea, emesis, diarrhea, melena or hematochezia. She has frequent constipation. No dysuria, gross hematuria, or urgency or oliguria. Incidentally her blood glucose was noticed to be 522 mg/dL and the EGD. Denies polyuria, polydipsia or blurred vision.  ED Course: Vital signs temperature 97.8, pulse 72, respirations 16, blood pressure 122/70 700% oxygen on room air. She received furosemide 40 mg IVP 1 and was started on a glucose stabilizer/insulin infusion while in the emergency department.  Her workup shows a urinalysis WBC is 8.2, hemoglobin 10.8 and platelets 104. Her EKG was sinus rhythm with RBBB and LPFB, repolarization abnormality suggests ischemia, lateral leads. Baseline wander in lead(s) I III aVL. Troponin level was 0.14 pg/mm and her BNP was over 4500 pg/mL. Lipase was 34. Her  CMP shows a sodium of 129, which is normal when corrected is 139 by Hillier's formula, potassium 5.0, chloride 98 and bicarbonate 18 mmol/L. Her BUN was 99, creatinine 1.92 and glucose 522 mg/dL. Her alkaline phosphatase was 566 and total bilirubin was 2.7 mg/dL.  Patient on 10/31 around 19:00 began to deteriorate.  Blood pressure declined and her respiratory status deteriorated.  She was placed on BiPap and started on IV pressor support.  Pulmonology consulted on 12-14-2022 in am.  Patient POA stated in am on 12/14/22 that patient would want to be a DNR and wanted no further interventions.  She was placed on comfort based measures and BiPap mask was removed.  She expired on 09:10.  Final Diagnoses:  1.   Multiorgan system failure 2. Respiratory failure 3. Acute on chronic congestive heart failure   The results of significant diagnostics from this hospitalization (including imaging, microbiology, ancillary and laboratory) are listed below for reference.    Significant Diagnostic Studies: Dg Chest 2 View  Result Date: 12-11-16 CLINICAL DATA:  Cough and congestion EXAM: CHEST  2 VIEW COMPARISON:  09/04/2016 FINDINGS: Prominent interstitial markings diffusely and bilaterally. This was not present on 11/23/2014 and is most compatible with acute disease. There is vascular congestion this is most likely interstitial edema. Pneumonia not excluded. No significant effusion. Underlying COPD. IMPRESSION: Prominent interstitial markings bilaterally most consistent with congestive heart failure and interstitial edema. Electronically Signed   By: Marlan Palau M.D.   On: December 11, 2016 18:43   Nm Hepato W/eject Fract  Result Date: 12/05/2016 CLINICAL DATA:  Cholelithiasis with wall thickening ureter EXAM: NUCLEAR MEDICINE HEPATOBILIARY IMAGING WITH GALLBLADDER EF TECHNIQUE: Sequential images of the abdomen were obtained out to 60 minutes following intravenous administration of radiopharmaceutical. After slow intravenous  infusion of 1.3 micrograms  Cholecystokinin, gallbladder ejection fraction was determined. RADIOPHARMACEUTICALS:  5.2 mCi Tc-105m Choletec IV COMPARISON:  CT 09/09/2016 FINDINGS: Uniform uptake within the liver. Counts slowly clear the liver with the common bile duct. Small bowel is evident by 30 minutes. The gallbladder begins to fill by 40 minutes. After infusion of CCK, there is some in delay contraction but ultimately the gallbladder contracts near completely with a calculated ejection fraction 87.5%. Calculated gallbladder ejection fraction is 87.5%. (At 60 min, normal ejection fraction is greater than 40%.) IMPRESSION: 1. Patent common bile duct and cystic duct. 2. Normal gallbladder ejection fraction. Electronically Signed   By: Michele Walters M.D.   On: 12/05/2016 13:34   Dg Chest Port 1 View  Result Date: 12/05/2016 CLINICAL DATA:  Shortness of breath and cough EXAM: PORTABLE CHEST 1 VIEW COMPARISON:  Chest radiograph 12-17-2016 FINDINGS: Unchanged mild cardiomegaly. Diffuse interstitial opacities have worsened from the prior study. No sizable pleural effusion. IMPRESSION: Worsening interstitial pulmonary edema, likely cardiogenic. Electronically Signed   By: Michele Walters M.D.   On: 12/05/2016 23:00    Microbiology: Recent Results (from the past 240 hour(s))  MRSA PCR Screening     Status: Abnormal   Collection Time: 12-17-2016 10:18 PM  Result Value Ref Range Status   MRSA by PCR POSITIVE (A) NEGATIVE Final    Comment: RESULT CALLED TO, READ BACK BY AND VERIFIED WITH: SMITH,F @0159  BY MATTHEWS B 10.31.18        The GeneXpert MRSA Assay (FDA approved for NASAL specimens only), is one component of a comprehensive MRSA colonization surveillance program. It is not intended to diagnose MRSA infection nor to guide or monitor treatment for MRSA infections.      Labs: Basic Metabolic Panel:  Recent Labs Lab Dec 17, 2016 1807 December 17, 2016 2301 12/05/16 0523 12/20/2016 0327  NA 129* 127*  129* 125*  K 5.0 4.2 3.7 4.5  CL 98* 98* 100* 99*  CO2 18* 16* 18* 17*  GLUCOSE 522* 410* 119* 146*  BUN 99* 98* 100* 104*  CREATININE 1.92* 1.86* 1.72* 1.94*  CALCIUM 8.4* 8.1* 8.2* 7.6*  MG  --  1.8 2.3  --    Liver Function Tests:  Recent Labs Lab 12/17/2016 1807 12/15/2016 0327  AST 24 29  ALT 20 17  ALKPHOS 166* 128*  BILITOT 2.7* 3.5*  PROT 5.3* 4.8*  ALBUMIN 2.2* 1.9*    Recent Labs Lab 2016/12/17 1807  LIPASE 34    Recent Labs Lab 12/28/2016 0327  AMMONIA 83*   CBC:  Recent Labs Lab 12/17/16 1807 12/05/16 0523  WBC 8.2 8.4  NEUTROABS 7.6 7.5  HGB 10.8* 10.7*  HCT 31.4* 30.6*  MCV 95.4 93.3  PLT 104* 104*   Cardiac Enzymes:  Recent Labs Lab Dec 17, 2016 2301 12/05/16 0523 12/05/16 1427 12/15/2016 0327  TROPONINI 0.10* 0.08* 0.05* 0.06*   D-Dimer No results for input(s): DDIMER in the last 72 hours. BNP: Invalid input(s): POCBNP CBG:  Recent Labs Lab 12/05/16 1612 12/05/16 1945 12/27/2016 0047 12/07/2016 0357 12/26/2016 0737  GLUCAP 148* 185* 159* 129* 88   Anemia work up No results for input(s): VITAMINB12, FOLATE, FERRITIN, TIBC, IRON, RETICCTPCT in the last 72 hours. Urinalysis    Component Value Date/Time   COLORURINE AMBER (A) 12-17-2016 1937   APPEARANCEUR HAZY (A) 12-17-2016 1937   LABSPEC 1.019 Dec 17, 2016 1937   PHURINE 5.0 2016/12/17 1937   GLUCOSEU 150 (A) December 17, 2016 1937   HGBUR NEGATIVE 2016/12/17 1937   BILIRUBINUR NEGATIVE 2016-12-17 1937   KETONESUR NEGATIVE 12/17/16  1937   PROTEINUR 30 (A) 12/01/2016 1937   NITRITE NEGATIVE 11/29/2016 1937   LEUKOCYTESUR NEGATIVE 11/11/2016 1937   Sepsis Labs Invalid input(s): PROCALCITONIN,  WBC,  LACTICIDVEN     SIGNED:  Katrinka BlazingAlex U Kadolph, MD  [facility] 12/18/2016, 5:44 PM Pager [phone]  If 7PM-7AM, please contact night-coverage [email or web address] Password [email or web address]

## 2017-01-05 NOTE — Progress Notes (Signed)
Spoke with April Shore at [facility], she states that the patient is a candidate for tissue only. Referral # is [id number].

## 2017-01-05 NOTE — Plan of Care (Signed)
Problem: Safety: Goal: Ability to remain free from injury will improve Outcome: Progressing Side rails up, bed in low position, call bell and personal items within reach   

## 2017-01-05 NOTE — Progress Notes (Signed)
MD notified of pt decreasing bp and of earlier call to midlevel with decreasing os sat. Informed of previous chest xray and abg results. New orders received

## 2017-01-05 DEATH — deceased

## 2017-10-29 IMAGING — DX DG CHEST 2V
2 series · 2 of 2 positions shown · non-contrast
Comparison: 08/04/2016

CLINICAL DATA: Fall

EXAM:
CHEST  2 VIEW

[chest lat]
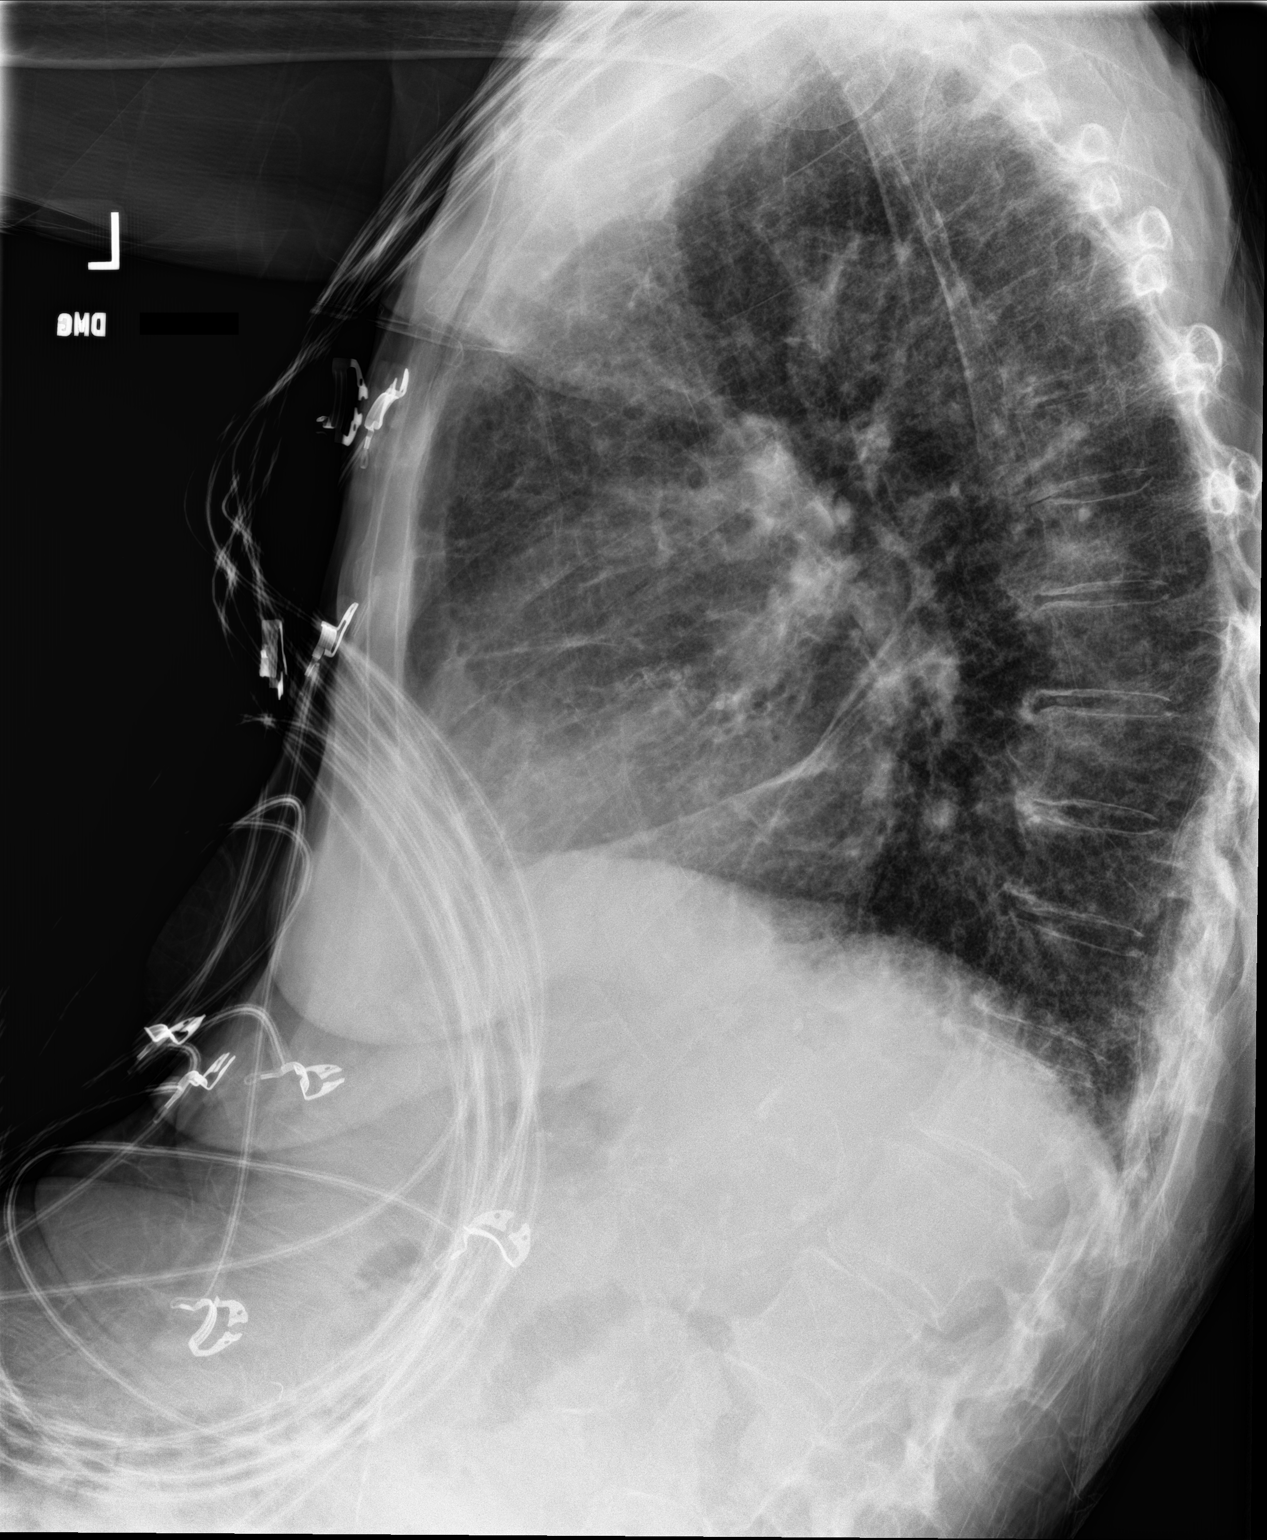

[chest ap]
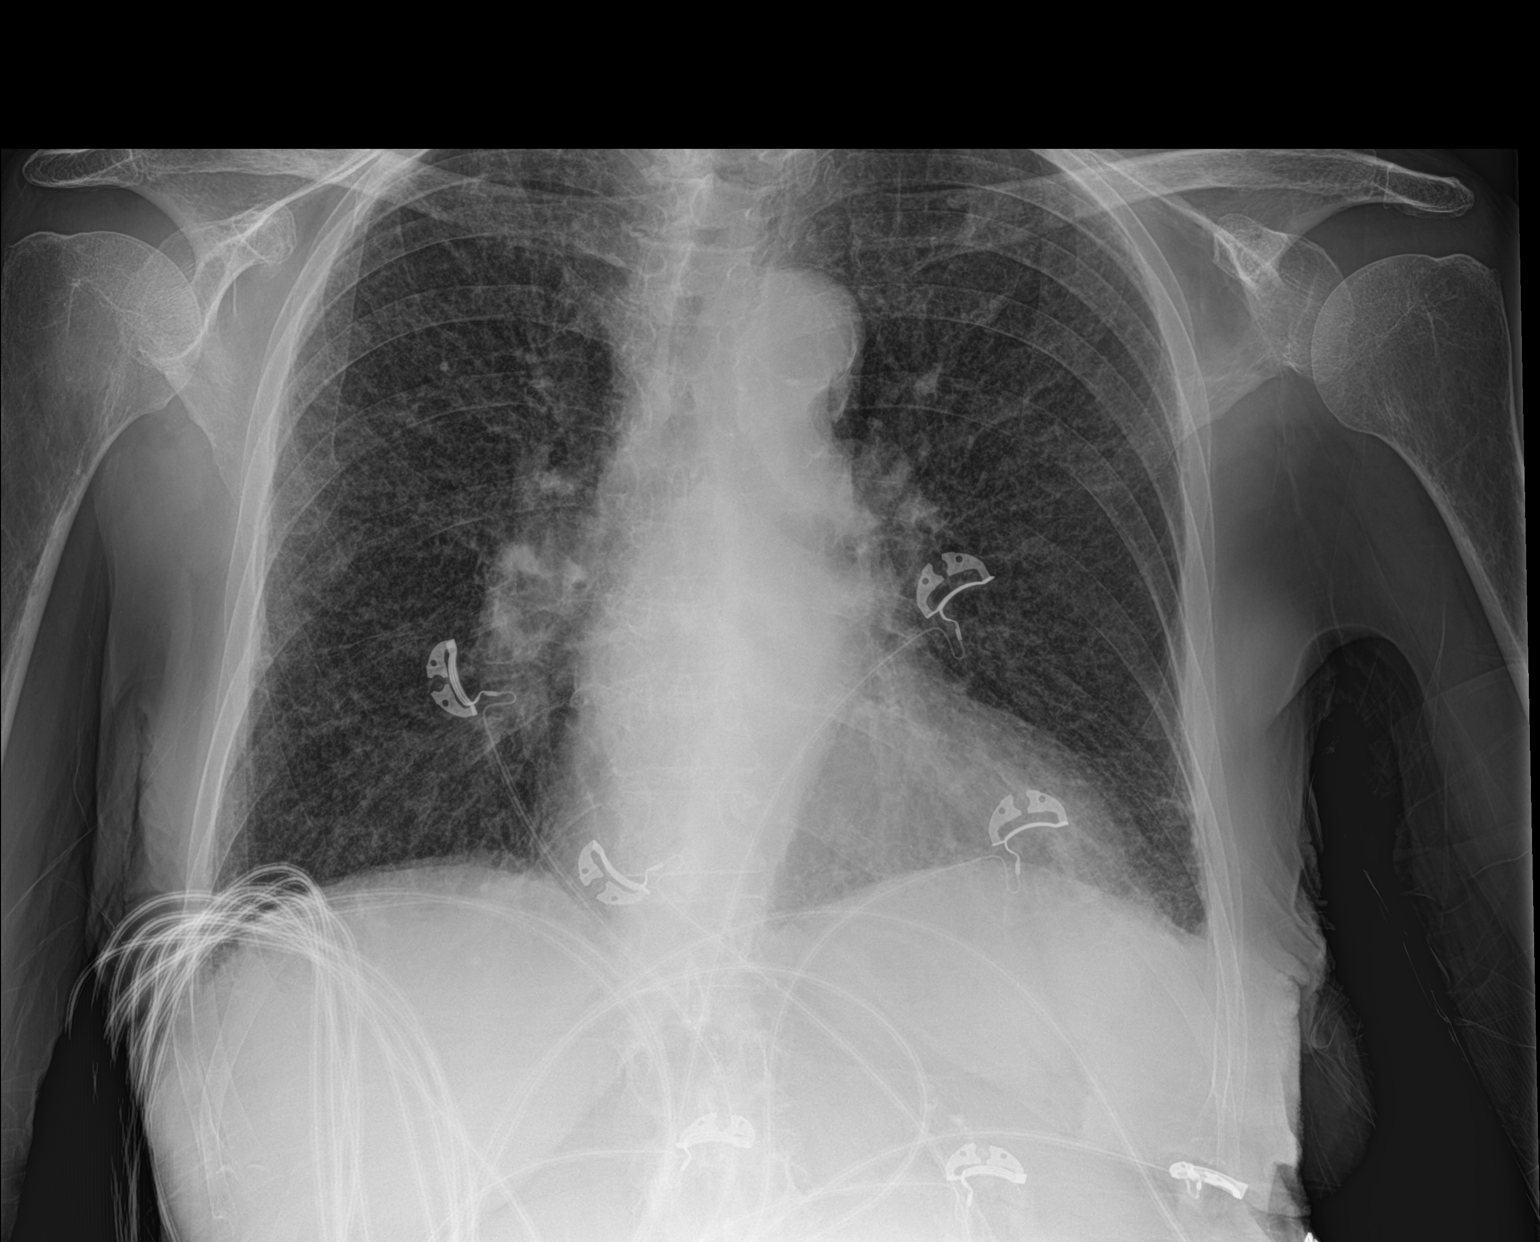

[2 of 2 positions shown; findings below may reference images not displayed]

FINDINGS: Chronic lung disease with hyperinflation and prominent lung markings
bilaterally. No focal consolidation. Negative for heart failure or
effusion. No acute fracture is identified.
IMPRESSION: Chronic lung disease. No acute abnormality. Improved aeration since
the prior study.

## 2017-10-29 IMAGING — CT CT HEAD W/O CM
3 series · 16 of 47 positions shown, 19 images · non-contrast
Comparison: None.

CLINICAL DATA: Generalized weakness. Vertigo and dizziness. 2 falls
in the past month. Weight loss.

EXAM:
CT HEAD WITHOUT CONTRAST
TECHNIQUE: Contiguous axial images were obtained from the base of the skull
through the vertex without intravenous contrast.

[Series 2: head trauma wo · axial · 0.42mm/px · z∈[+1583,+1708]mm · 10 of 30 slices shown, 13 images]
[im 3/30  brain]
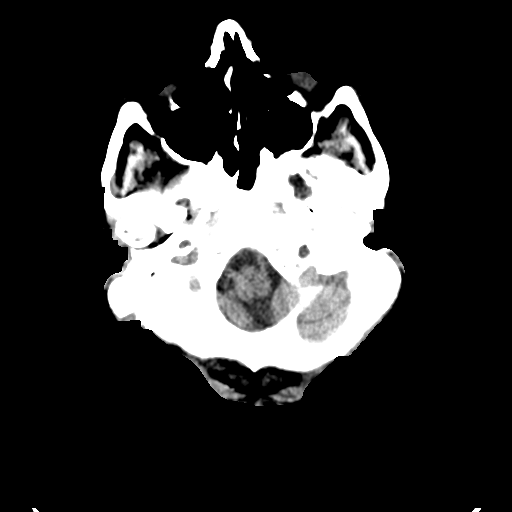
[im 3/30  bone]
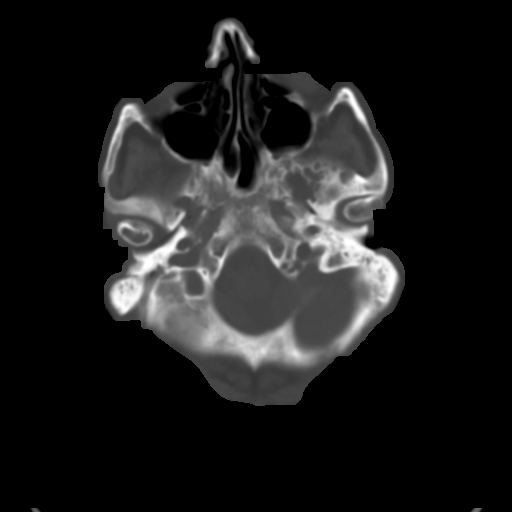
[im 6/30  brain]
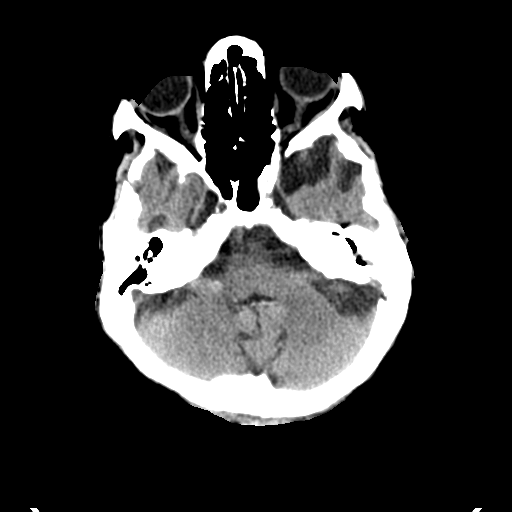
[im 9/30  brain]
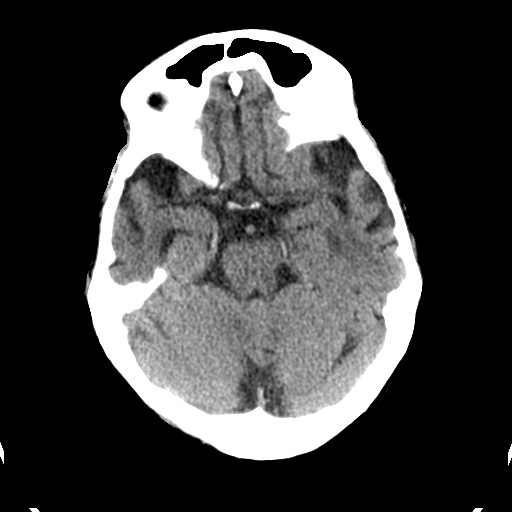
[im 11/30  brain]
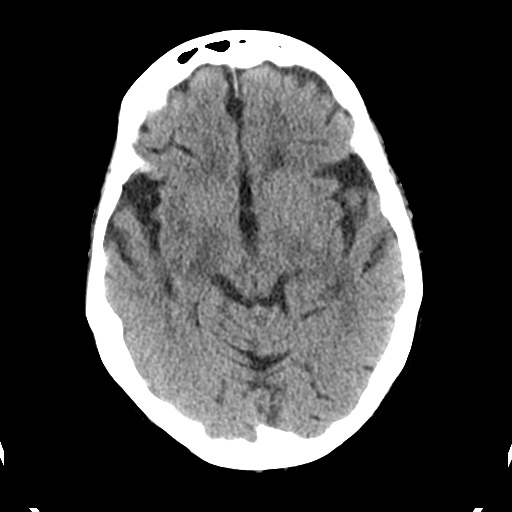
[im 14/30  brain]
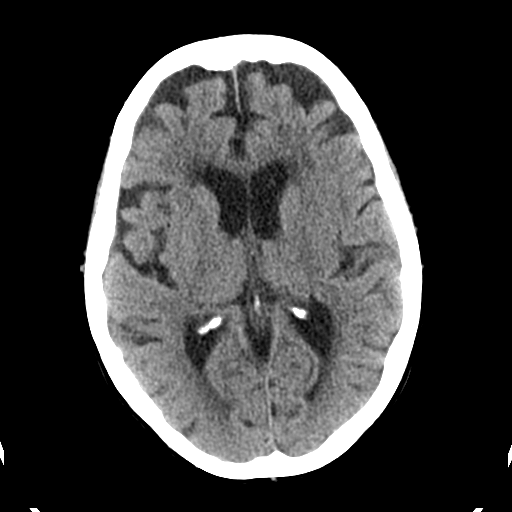
[im 14/30  bone]
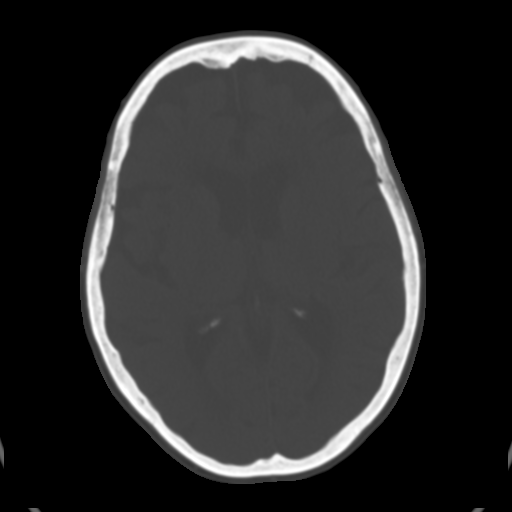
[im 17/30  brain]
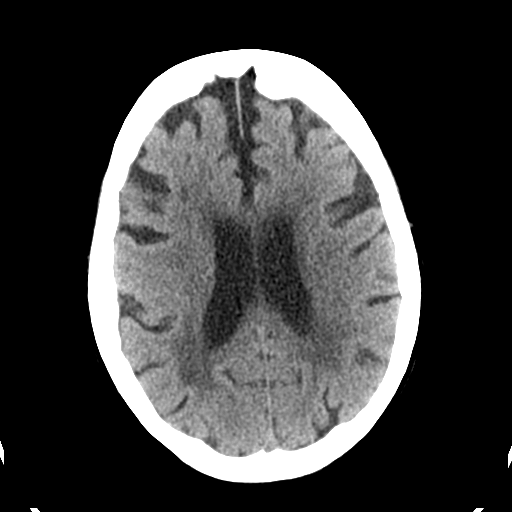
[im 20/30  brain]
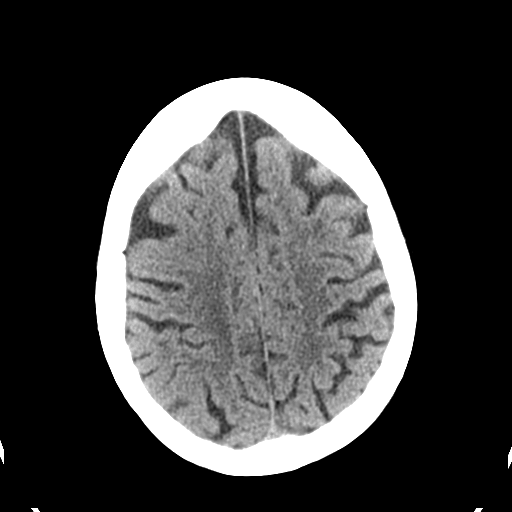
[im 23/30  brain]
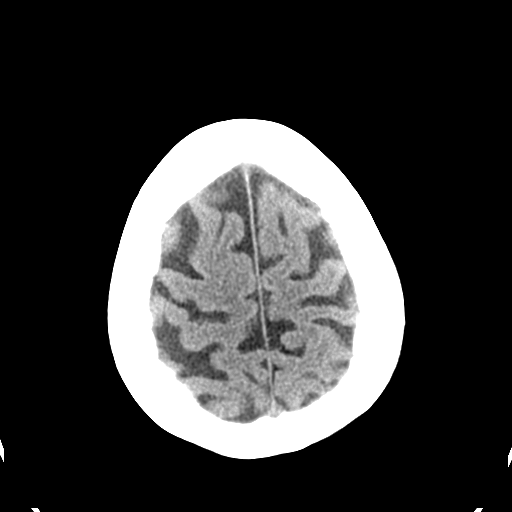
[im 25/30  brain]
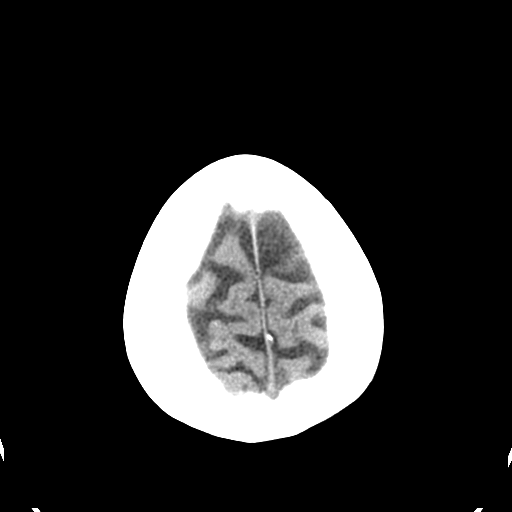
[im 25/30  bone]
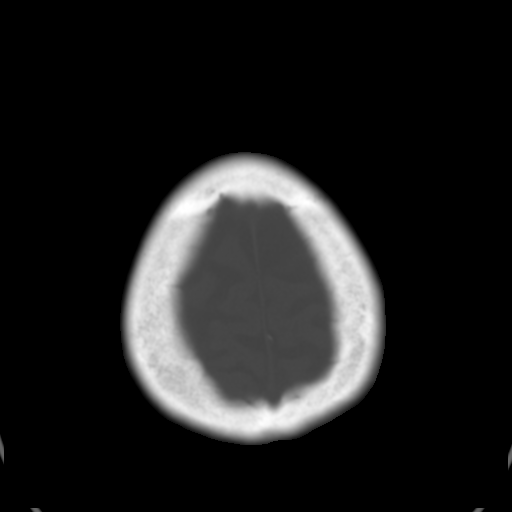
[im 28/30  brain]
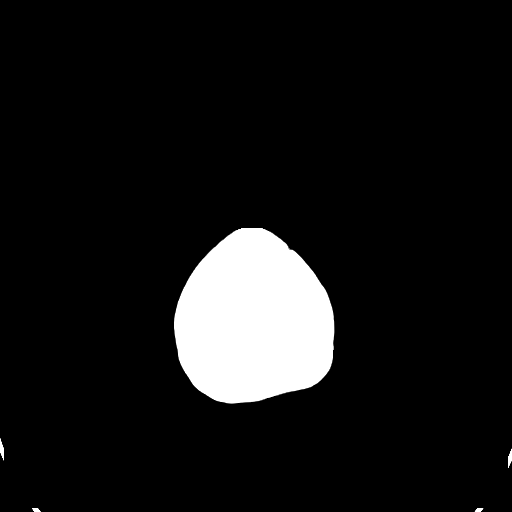

[Series 4: coronal soft tissue · coronal · 0.30mm/px · 3 of 65 slices shown]
[im 22/65  brain]
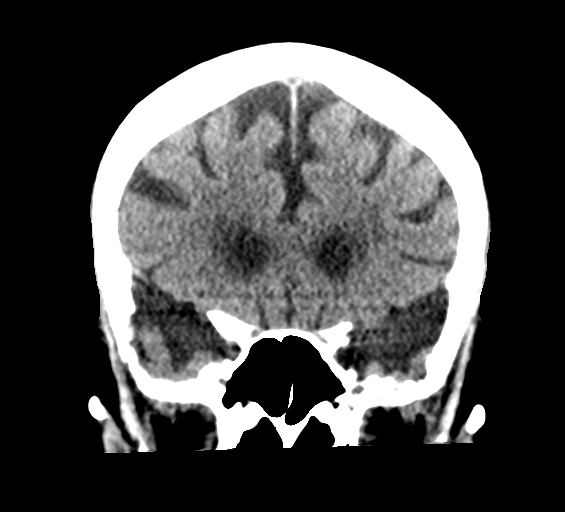
[im 29/65  brain]
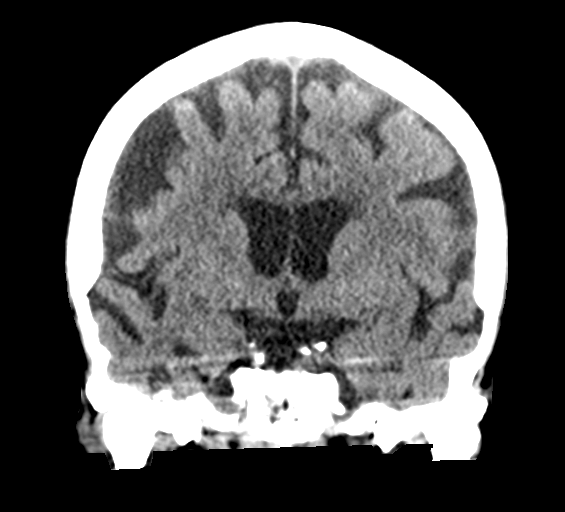
[im 36/65  brain]
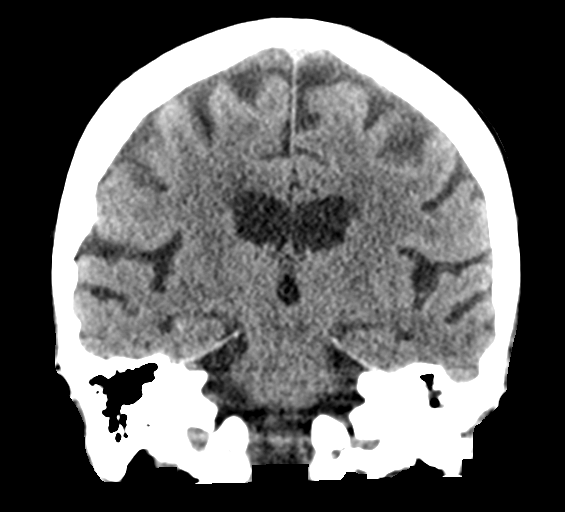

[Series 5: sagittal soft tissue · sagittal · 0.33mm/px · 3 of 51 slices shown]
[im 17/51  brain]
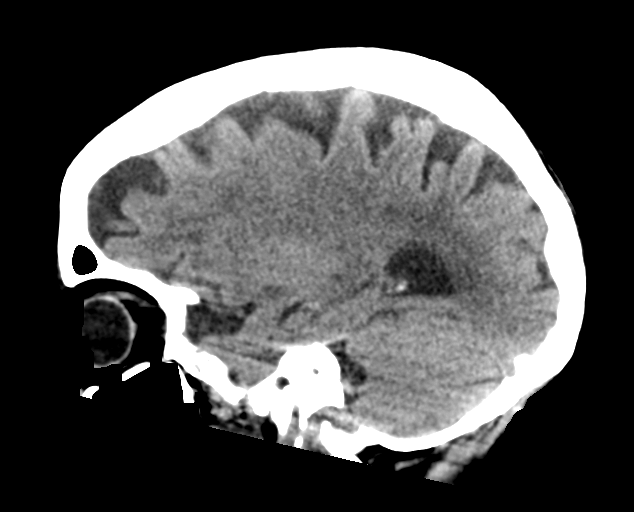
[im 26/51  brain]
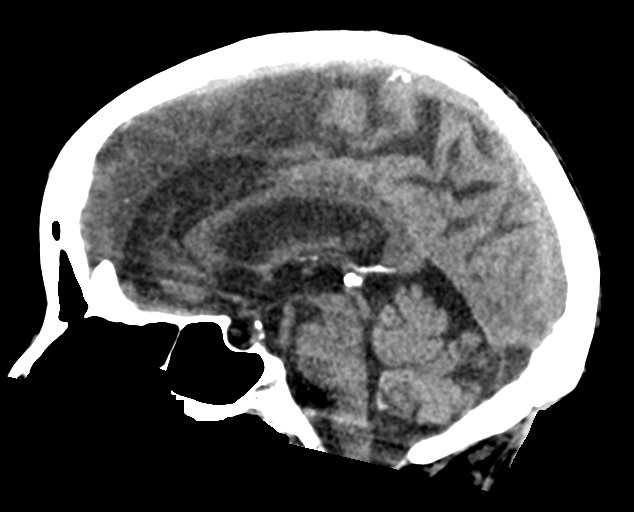
[im 34/51  brain]
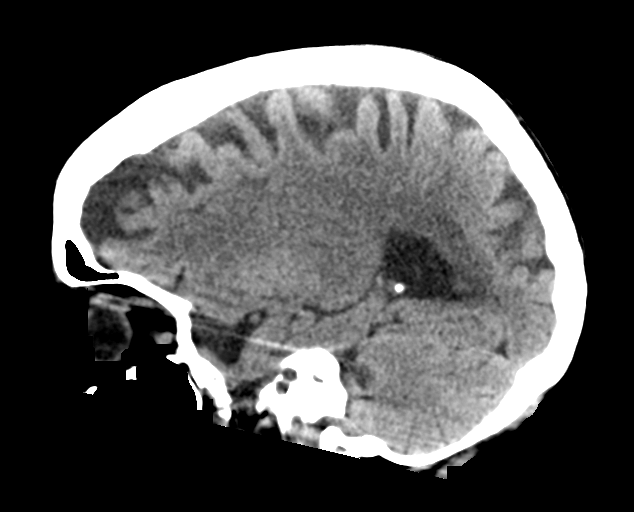

[16 of 47 positions shown; findings below may reference images not displayed]

FINDINGS: Brain: Diffusely enlarged ventricles and subarachnoid spaces. Patchy
white matter low density in both cerebral hemispheres. No
intracranial hemorrhage, mass lesion or CT evidence of acute
infarction.

Vascular: No hyperdense vessel or unexpected calcification.

Skull: Mild bilateral hyperostosis frontalis.

Sinuses/Orbits: Bilateral lens implants. Unremarkable included
paranasal sinuses.

Other:  None.
IMPRESSION: 1. No acute abnormality.
2. Mild to moderate diffuse cerebral and cerebellar atrophy.
3. Moderate chronic small vessel white matter ischemic changes in
both cerebral hemispheres.
